# Patient Record
Sex: Male | Born: 1960 | Race: White | Hispanic: No | Marital: Single | Smoking: Current every day smoker
Health system: Southern US, Community
[De-identification: ages and names within clinical notes are randomized; demographics above are authoritative.]

## PROBLEM LIST (undated history)

## (undated) DIAGNOSIS — E559 Vitamin D deficiency, unspecified: Secondary | ICD-10-CM

## (undated) DIAGNOSIS — I639 Cerebral infarction, unspecified: Secondary | ICD-10-CM

## (undated) DIAGNOSIS — G4730 Sleep apnea, unspecified: Secondary | ICD-10-CM

## (undated) DIAGNOSIS — J449 Chronic obstructive pulmonary disease, unspecified: Secondary | ICD-10-CM

## (undated) DIAGNOSIS — I10 Essential (primary) hypertension: Secondary | ICD-10-CM

## (undated) DIAGNOSIS — E119 Type 2 diabetes mellitus without complications: Secondary | ICD-10-CM

## (undated) DIAGNOSIS — I517 Cardiomegaly: Secondary | ICD-10-CM

## (undated) DIAGNOSIS — E785 Hyperlipidemia, unspecified: Secondary | ICD-10-CM

## (undated) HISTORY — DX: Cardiomegaly: I51.7

## (undated) HISTORY — DX: Vitamin D deficiency, unspecified: E55.9

## (undated) HISTORY — PX: NASAL SINUS SURGERY: SHX719

## (undated) HISTORY — PX: KNEE SURGERY: SHX244

## (undated) HISTORY — DX: Hyperlipidemia, unspecified: E78.5

## (undated) HISTORY — DX: Sleep apnea, unspecified: G47.30

## (undated) HISTORY — DX: Type 2 diabetes mellitus without complications: E11.9

---

## 2008-12-29 ENCOUNTER — Emergency Department: Payer: Self-pay

## 2009-04-11 ENCOUNTER — Emergency Department: Payer: Self-pay | Admitting: Emergency Medicine

## 2009-05-21 ENCOUNTER — Emergency Department: Payer: Self-pay | Admitting: Emergency Medicine

## 2009-06-30 ENCOUNTER — Emergency Department: Payer: Self-pay | Admitting: Emergency Medicine

## 2012-05-18 ENCOUNTER — Observation Stay: Payer: Self-pay | Admitting: Internal Medicine

## 2012-05-18 LAB — COMPREHENSIVE METABOLIC PANEL
BUN: 25 mg/dL — ABNORMAL HIGH (ref 7–18)
Bilirubin,Total: 0.3 mg/dL (ref 0.2–1.0)
Chloride: 106 mmol/L (ref 98–107)
Creatinine: 1.19 mg/dL (ref 0.60–1.30)
Osmolality: 283 (ref 275–301)
Potassium: 4.1 mmol/L (ref 3.5–5.1)
SGPT (ALT): 31 U/L (ref 12–78)
Total Protein: 8.4 g/dL — ABNORMAL HIGH (ref 6.4–8.2)

## 2012-05-18 LAB — CBC
HCT: 47.3 %
HGB: 15.4 g/dL
MCH: 27.5 pg
MCHC: 32.5 g/dL
MCV: 85 fL
Platelet: 341 x10 3/mm 3
RBC: 5.58 x10 6/mm 3
RDW: 15.8 % — ABNORMAL HIGH
WBC: 17.3 x10 3/mm 3 — ABNORMAL HIGH

## 2012-05-18 LAB — CK TOTAL AND CKMB (NOT AT ARMC)
CK, Total: 66 U/L
CK-MB: 0.6 ng/mL

## 2012-05-18 LAB — TROPONIN I: Troponin-I: <0.02 ng/mL

## 2012-05-19 LAB — TROPONIN I
Troponin-I: <0.02 ng/mL
Troponin-I: <0.02 ng/mL

## 2012-05-19 LAB — CK-MB: CK-MB: <0.5 ng/mL — ABNORMAL LOW (ref 0.5–3.6)

## 2012-06-28 ENCOUNTER — Inpatient Hospital Stay: Payer: Self-pay | Admitting: Internal Medicine

## 2012-06-28 LAB — DIFFERENTIAL
Basophil #: 0.2 10*3/uL — ABNORMAL HIGH (ref 0.0–0.1)
Basophil %: 0.8 %
Eosinophil #: 0.5 10*3/uL (ref 0.0–0.7)
Lymphocyte #: 4.7 10*3/uL — ABNORMAL HIGH (ref 1.0–3.6)
Lymphocyte %: 17.6 %
Lymphocytes: 14 %
Monocyte #: 2.5 x10 3/mm — ABNORMAL HIGH (ref 0.2–1.0)
Monocytes: 7 %
Neutrophil %: 70.2 %
Segmented Neutrophils: 78 %

## 2012-06-28 LAB — CBC
MCH: 27.8 pg (ref 26.0–34.0)
MCHC: 32.6 g/dL (ref 32.0–36.0)
MCV: 85 fL (ref 80–100)
Platelet: 286 10*3/uL (ref 150–440)
RBC: 5.35 10*6/uL (ref 4.40–5.90)
RDW: 15.1 % — ABNORMAL HIGH (ref 11.5–14.5)

## 2012-06-28 LAB — TROPONIN I: Troponin-I: <0.02 ng/mL

## 2012-06-28 LAB — DRUG SCREEN, URINE
Amphetamines, Ur Screen: NEGATIVE (ref ?–1000)
Benzodiazepine, Ur Scrn: NEGATIVE (ref ?–200)
Cannabinoid 50 Ng, Ur ~~LOC~~: NEGATIVE (ref ?–50)
Cocaine Metabolite,Ur ~~LOC~~: NEGATIVE (ref ?–300)
MDMA (Ecstasy)Ur Screen: NEGATIVE (ref ?–500)
Tricyclic, Ur Screen: NEGATIVE (ref ?–1000)

## 2012-06-28 LAB — BASIC METABOLIC PANEL
Anion Gap: 11 (ref 7–16)
Calcium, Total: 9.4 mg/dL (ref 8.5–10.1)
Chloride: 106 mmol/L (ref 98–107)
Co2: 24 mmol/L (ref 21–32)
EGFR (African American): 41 — ABNORMAL LOW
Osmolality: 284 (ref 275–301)
Potassium: 3.5 mmol/L (ref 3.5–5.1)

## 2012-06-28 LAB — HEPATIC FUNCTION PANEL A (ARMC)
Bilirubin,Total: 0.3 mg/dL (ref 0.2–1.0)
Total Protein: 7.4 g/dL (ref 6.4–8.2)

## 2012-06-28 LAB — ETHANOL: Ethanol: <3 mg/dL

## 2012-06-29 LAB — CBC WITH DIFFERENTIAL/PLATELET
Bands: 3 %
Comment - H1-Com2: NORMAL
Eosinophil #: 0.5 10*3/uL (ref 0.0–0.7)
Eosinophil %: 3.1 %
Lymphocyte #: 3.6 10*3/uL (ref 1.0–3.6)
Lymphocyte %: 24.4 %
Lymphocytes: 24 %
MCH: 28.1 pg (ref 26.0–34.0)
MCHC: 33.2 g/dL (ref 32.0–36.0)
Monocyte #: 1.3 x10 3/mm — ABNORMAL HIGH (ref 0.2–1.0)
Monocytes: 2 %
Neutrophil %: 62.8 %
Platelet: 258 10*3/uL (ref 150–440)
RBC: 4.86 10*6/uL (ref 4.40–5.90)
RDW: 14.8 % — ABNORMAL HIGH (ref 11.5–14.5)
Segmented Neutrophils: 59 %

## 2012-06-29 LAB — BASIC METABOLIC PANEL
Anion Gap: 10 (ref 7–16)
BUN: 20 mg/dL — ABNORMAL HIGH (ref 7–18)
Calcium, Total: 8.9 mg/dL (ref 8.5–10.1)
Co2: 24 mmol/L (ref 21–32)
Creatinine: 1.34 mg/dL — ABNORMAL HIGH (ref 0.60–1.30)
Glucose: 124 mg/dL — ABNORMAL HIGH (ref 65–99)
Osmolality: 285 (ref 275–301)
Potassium: 3.7 mmol/L (ref 3.5–5.1)
Sodium: 141 mmol/L (ref 136–145)

## 2012-06-29 LAB — LIPID PANEL
Ldl Cholesterol, Calc: 145 mg/dL — ABNORMAL HIGH (ref 0–100)
Triglycerides: 182 mg/dL (ref 0–200)

## 2012-06-30 LAB — BASIC METABOLIC PANEL
Anion Gap: 8 (ref 7–16)
BUN: 11 mg/dL (ref 7–18)
Creatinine: 0.95 mg/dL (ref 0.60–1.30)
EGFR (African American): >60
EGFR (Non-African Amer.): >60
Sodium: 140 mmol/L (ref 136–145)

## 2012-07-02 LAB — BASIC METABOLIC PANEL
Anion Gap: 9 (ref 7–16)
Calcium, Total: 9.2 mg/dL (ref 8.5–10.1)
Chloride: 103 mmol/L (ref 98–107)
Co2: 27 mmol/L (ref 21–32)
Glucose: 122 mg/dL — ABNORMAL HIGH (ref 65–99)
Osmolality: 278 (ref 275–301)
Potassium: 4.2 mmol/L (ref 3.5–5.1)

## 2012-07-02 LAB — CBC WITH DIFFERENTIAL/PLATELET
Basophil #: 0.2 10*3/uL — ABNORMAL HIGH (ref 0.0–0.1)
Basophil %: 1.1 %
Eosinophil %: 4.5 %
HCT: 45.3 % (ref 40.0–52.0)
Lymphocyte #: 4.5 10*3/uL — ABNORMAL HIGH (ref 1.0–3.6)
Lymphocyte %: 28 %
Monocyte %: 13.1 %
Neutrophil #: 8.5 10*3/uL — ABNORMAL HIGH (ref 1.4–6.5)
Platelet: 282 10*3/uL (ref 150–440)
RBC: 5.31 10*6/uL (ref 4.40–5.90)
RDW: 14.5 % (ref 11.5–14.5)
WBC: 16 10*3/uL — ABNORMAL HIGH (ref 3.8–10.6)

## 2012-07-09 ENCOUNTER — Ambulatory Visit: Payer: Self-pay | Admitting: Unknown Physician Specialty

## 2012-12-13 DIAGNOSIS — D386 Neoplasm of uncertain behavior of respiratory organ, unspecified: Secondary | ICD-10-CM | POA: Insufficient documentation

## 2012-12-13 DIAGNOSIS — J329 Chronic sinusitis, unspecified: Secondary | ICD-10-CM | POA: Insufficient documentation

## 2013-01-11 DIAGNOSIS — K219 Gastro-esophageal reflux disease without esophagitis: Secondary | ICD-10-CM | POA: Insufficient documentation

## 2013-01-11 DIAGNOSIS — R569 Unspecified convulsions: Secondary | ICD-10-CM | POA: Insufficient documentation

## 2013-01-11 DIAGNOSIS — R0609 Other forms of dyspnea: Secondary | ICD-10-CM

## 2013-01-11 DIAGNOSIS — I10 Essential (primary) hypertension: Secondary | ICD-10-CM | POA: Insufficient documentation

## 2013-01-11 DIAGNOSIS — Z720 Tobacco use: Secondary | ICD-10-CM | POA: Insufficient documentation

## 2013-01-11 DIAGNOSIS — J449 Chronic obstructive pulmonary disease, unspecified: Secondary | ICD-10-CM | POA: Insufficient documentation

## 2013-10-01 ENCOUNTER — Observation Stay: Payer: Self-pay | Admitting: Internal Medicine

## 2013-10-01 LAB — COMPREHENSIVE METABOLIC PANEL
ALK PHOS: 123 U/L — AB
Albumin: 4.1 g/dL (ref 3.4–5.0)
Anion Gap: 12 (ref 7–16)
BILIRUBIN TOTAL: 0.3 mg/dL (ref 0.2–1.0)
BUN: 16 mg/dL (ref 7–18)
CO2: 19 mmol/L — AB (ref 21–32)
CREATININE: 1.47 mg/dL — AB (ref 0.60–1.30)
Calcium, Total: 9.2 mg/dL (ref 8.5–10.1)
Chloride: 106 mmol/L (ref 98–107)
EGFR (African American): >60
EGFR (Non-African Amer.): 53 — ABNORMAL LOW
Glucose: 77 mg/dL (ref 65–99)
Osmolality: 274 (ref 275–301)
Potassium: 4.4 mmol/L (ref 3.5–5.1)
SGOT(AST): 28 U/L (ref 15–37)
SGPT (ALT): 45 U/L (ref 12–78)
Sodium: 137 mmol/L (ref 136–145)
Total Protein: 7.5 g/dL (ref 6.4–8.2)

## 2013-10-01 LAB — CBC
HCT: 47.7 % (ref 40.0–52.0)
HGB: 15.4 g/dL (ref 13.0–18.0)
MCH: 27 pg (ref 26.0–34.0)
MCHC: 32.3 g/dL (ref 32.0–36.0)
MCV: 84 fL (ref 80–100)
Platelet: 314 10*3/uL (ref 150–440)
RBC: 5.71 10*6/uL (ref 4.40–5.90)
RDW: 14.8 % — AB (ref 11.5–14.5)
WBC: 25.7 10*3/uL — AB (ref 3.8–10.6)

## 2013-10-01 LAB — URINALYSIS, COMPLETE
BACTERIA: NONE SEEN
BILIRUBIN, UR: NEGATIVE
Glucose,UR: NEGATIVE mg/dL (ref 0–75)
Granular Cast: 10
KETONE: NEGATIVE
LEUKOCYTE ESTERASE: NEGATIVE
Nitrite: NEGATIVE
PROTEIN: NEGATIVE
Ph: 5 (ref 4.5–8.0)
RBC,UR: 2 /HPF (ref 0–5)
Specific Gravity: 1.016 (ref 1.003–1.030)
Squamous Epithelial: NONE SEEN
WBC UR: 6 /HPF (ref 0–5)

## 2013-10-01 LAB — TROPONIN I: Troponin-I: <0.02 ng/mL

## 2013-10-01 LAB — PROTIME-INR
INR: 1
PROTHROMBIN TIME: 13.1 s (ref 11.5–14.7)

## 2013-10-02 LAB — LIPID PANEL
CHOLESTEROL: 143 mg/dL (ref 0–200)
HDL: 21 mg/dL — AB (ref 40–60)
Ldl Cholesterol, Calc: 91 mg/dL (ref 0–100)
TRIGLYCERIDES: 155 mg/dL (ref 0–200)
VLDL Cholesterol, Calc: 31 mg/dL (ref 5–40)

## 2013-10-03 LAB — SEDIMENTATION RATE: Erythrocyte Sed Rate: 2 mm/hr (ref 0–20)

## 2013-11-25 ENCOUNTER — Encounter: Payer: Self-pay | Admitting: Internal Medicine

## 2013-12-25 ENCOUNTER — Encounter: Payer: Self-pay | Admitting: Internal Medicine

## 2014-01-24 ENCOUNTER — Encounter: Payer: Self-pay | Admitting: Internal Medicine

## 2014-05-13 ENCOUNTER — Ambulatory Visit: Payer: Self-pay | Admitting: Specialist
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
## 2015-01-13 NOTE — Discharge Summary (Signed)
PATIENT NAME:  Mark Vang, Mark Vang MR#:  [id number] DATE OF BIRTH:  09-14-1961  DATE OF ADMISSION:  06/28/2012 DATE OF DISCHARGE:  07/02/2012  DISCHARGE DIAGNOSIS: Stroke.   PROCEDURES: CT scan of brain. MRI of the brain.   DISCHARGE MEDICATIONS:  1. Aspirin 81 mg daily.  2. Lipitor 20 mg daily.  3. Patient to resume other home medications.   CONSULTATION: Neurology.   HOSPITAL COURSE: Patient was admitted through the Emergency Room where he presented with several hour history of speech difficulties and numbness and paresthesia on his left side. His symptoms started improving prior to arrival in the ED and was admitted to monitored unit for further evaluation and management. Initial CT scan was negative for an acute infarct, however, MRI of the brain confirmed an acute stroke. Patient was started on antiplatelet therapy and his clinical symptoms continued to improve. At the time of discharge patient'Sylva Overley speech had improved considerably but still expressed expressive dysphasia. Patient also did complain of abdominal pain during his stay here. This was relieved by laxatives. His imaging was normal specifically abdominal imaging. Patient'Lilliauna Van risk factors were addressed. He had an elevated LDL and was started on Lipitor to address this. Patient will be discharged to home in a stable condition.   DISCHARGE INSTRUCTIONS:  1. Diet: Low fat, low sodium.  2. Activity as tolerated.  3. Follow up with Dr. Elijio Miles in 1 to 2 weeks.   DISCHARGE PROCESS TIME SPENT: 35 minutes.  ____________________________ Venetia Maxon Elijio Miles, MD sat:cms D: 07/08/2012 11:56:16 ET T: 07/09/2012 09:31:20 ET  JOB#: [id number] Alfredia Ferguson A TEJAN-SIE MD ELECTRONICALLY SIGNED 07/13/2012 13:17
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
## 2015-01-13 NOTE — Consult Note (Signed)
Referring Physician:  Darrick Meigs :   Primary Care Physician:  Selinda Michaels, [address] [place], [place] [address], [place], [facility] : [facility], [address], [place], [place] [address], [place] [phone]  Reason for Consult:  Admit Date: 28-Jun-2012   Chief Complaint: Vertigo and left body numbness.   Reason for Consult: CVA; vertigo   History of Present Illness:  History of Present Illness:   PATIENT NAME:  Mark Vang, Mark Vang [id number] OF BIRTH:  02-May-1961 COMPLAINT: left body numbness, vertigo, and speech deficit.  OF PRESENT ILLNESS: man with a history of COPD and recent admission for chest pain found to be of non cardiac etiology is admitted because of left body numbness, vertigo, and speech deficit.  He says he was fishing two days ago when he began to feel "like I needed to go.  Now".  He says he felt an abrupt onset sense of dizziness/vertigo and some lightheadedness which was very unusual for him.  He also noticed some abnormal vision changes although he cannot clearly describe this in any detail besides saying "I was seeing six out of each eye".  He was able to walk without problems.  He then began to have some left sided numbness in the LUE and also possibly the left torso but not the left leg.  He became nauseous and was sweating profusely but did not vomit.  There was no associated chest pain.  He then went home.  While at home he noticed that he was having difficulty getting his words out.  He says he knew what he wanted to say but could not get the words out.  He also endorses some general confusion since the event started. symptoms were constant when they were present and since the event they he is feeling a lot better.  Nothing seemed to make the symptoms better or worse.  The symptoms he believes the symptoms were of moderate severity.  Currently, the symptoms are  essentially completely resolved except that he has some word finding difficulties and mild difficulty getting his words out.       had never had symptoms like this before.   MEDICAL HISTORY:  1. Osteoarthritis of the knees and hips. 2. Gastroesophageal reflux disease.  Chronic obstructive pulmonary disease.  Tobacco abuse.  Bilateral knee surgeries.  Broken ribs in the chest.  History of chest pain, resolved, not cardiac.  SURGICAL HISTORY: B/l knee surgeries.  HISTORY:  HTN and CAD. HISTORY: Patient is currently on disability. Denies alcohol use. Denies IV drug abuse. Smokes 1-2 ppd cigarettes. MEDICATIONS:  1. Tramadol 50 mg twice daily.  2. Protonix 40 mg daily. ProAir HFA 2 puffs 4 times a day. Naproxen 500 mg twice daily.  Loratadine 10 mg once daily.  Lisinopril 20 mg once daily.  Albuterol ipratropium Advair Diskus 250 mg twice daily.   ALLERGIES: No known drug allergies.   GENERAL.gentleman, with his mom and girlfriend today.  Normocephalic and atraumatic. exam without pharmacologic dilation shows normal disc size, appearance and C/D ratio.  There is no papilledema. and S2 sounds are within normal limits, without murmurs, gallops, or rubs. - Normal- NormalDrift - Absent bilaterally.- Gait and station are within normal limits, walks to the bathroom with no problems.    Shoulder abduction (deltoid/supraspinatus, axillary/suprascapular n, C5)   Elbow flexion (biceps brachii, musculoskeletal n, C5-6)   Elbow extension (triceps, radial n, C7)   Finger adduction (interossei,  ulnar n, T1)    Hip flexion (iliopsoas, L1/L2)   Knee flexion (hamstrings, sciatic n, L5/S1)    Knee extension (quadriceps, femoral n, L3/4)   Ankle dorsiflexion (tibialis anterior, deep fibular n, L4/5)   Ankle plantarflexion (gastroc, tibial n, S1)  STATUS.is oriented to person, place and time.  Recent and remote memory are intact.  Attention span and concentration are intact.  Naming, repetition, and comprehension are  within normal limits.  There is mild word finding difficulty during spontaneous speech.  There is mild expressive aphasia.  Patient's fund of knowledge is within normal limits for educational level. NERVES.   CN II (normal visual acuity and visual fields)   CN III, IV, VI (extraocular muscles are intact)   CN V (facial sensation is intact bilaterally)   CN VII (facial strength is intact bilaterally)   CN VIII (hearing is intact bilaterally)   CN IX/X (palate elevates midline, normal phonation)   CN XI (shoulder shrug strength is normal and symmetric)   CN XII (tongue protrudes midline) to pain and temp bilaterally (spinothalamic tracts)to position and vibration bilaterally (dorsal columns)    Biceps   Brachioradialis    Patellar   Achilles to nose testing is within normal limits bilaterally. have personally viewed the Brain MRI results and agree with the radiologist interpretation that there appears to be restricted diffusion in the left frontal head region suggesting acute stroke in this location. man with a history of COPD and recent admission for chest pain found to be of non cardiac etiology is admitted because of left body numbness, vertigo, and speech deficit.   STROKE - LEFT FRONTAL - The location of this stroke explains his mild expressive aphasia.  However, there is no lesion to explain his left sided numbness and vertigo.  It is possible that he had a proximal thromboembolism resulting in a stroke in the left frontal location and smaller emboli that lodged in the right MCA causing the transient left sided symptoms that have resolved.   - Continue frequent neuro checks (Q4h) for first 24 hours and reassess frequency after that time. - Brain MRI/A - Personaly viewed, shows left frontal stroke in the location of Broca's areaCarotid doppler to rule out carotid stenosis - TTE with bubble study to evaluate for intracardiac shunts or cardiac thrombi - Monitor on telemetry and repeat EKG to r/o afib -  Check fasting lipid panel, TSH and HbA1c - Cycle cardiac enzymes . CARDIOVASCULAR: - Cardiac monitoring as described above. - Antiplatelet therapy: continue aspirin 81 mg PO daily for secondary stroke prevention . LIPID TREATMENT: - Per ATP III guidlines, treat if LDL > 70 . BLOOD PRESSURE CONTROL: - During the first several hours after stroke, control for factors such as anxiety/stress/pain. When these factors are controlled, if blood pressure remains higher than 220/110, treat with a short acting agent (IV labetalol). - Allow permissive HTN for BP less than 220/110 - The 2 trials of antihypertensive therapy to prevent recurrent stroke that have shown benefit started medication >2 weeks and >4 weeks after the acute stroke. Marland Kitchen ROUTINE: - PT, OT are not necessary since his exam is back to normal. - Would recommend Speech therapy evaluation for mild expressive aphasia, word finding difficulties, and stroke education - Counseling for smoke cessation. - DVT prophylaxis with [place] lovenox. Melrose Nakayama, MD       ROS:   General denies complaints    HEENT no complaints    Lungs no complaints    Cardiac no  complaints    GI no complaints    GU no complaints    Musculoskeletal no complaints    Extremities no complaints    Skin no complaints    Endocrine no complaints   Past Medical/Surgical Hx:  HTN:   arthritis in both knees and hips:   broken rib on left chest:   gerd:   copd:   bilateral knee sugeries:   Home Medications: Medication Instructions Last Modified Date/Time  Protonix 40 mg oral delayed release tablet 1 tab(s) orally once a day 03-Oct-13 17:44  lisinopril 20 mg oral tablet 1 tab(s) orally once a day 03-Oct-13 17:44  Advair Diskus 250 mcg-50 mcg inhalation powder 1 puff(s) inhaled 2 times a day 03-Oct-13 17:44  ProAir HFA 2 puff(s) orally 4 times a day, As Needed- for Shortness of Breath  03-Oct-13 17:44  naproxen 500 mg oral tablet 1 tab(s) orally 2 times a  day 03-Oct-13 17:44  loratadine 10 mg oral tablet 1 tab(s) orally once a day 03-Oct-13 17:44  albuterol-ipratropium 1 vial(s) inhaled , As Needed- for Shortness of Breath  03-Oct-13 17:44  tramadol 50 mg oral tablet 1 tab(s) orally 2 times a day 03-Oct-13 17:44   [facility] Current Meds:  Acetaminophen * tablet, ( Tylenol (325 mg) tablet)  650 mg Oral q4h PRN for pain or temp. greater than 100.4  - Indication: Pain/Fever  Aspirin Enteric Coated tablet, ( Ecotrin)  81 mg Oral daily  - Indication: Pain/Fever/Thromboembolic Disorders/Post MI/Prophylaxis MI  Instructions:  Initiate Bleeding Precautions Protocol--DO NOT CRUSH  HePARin injection, 5000 unit(s), Subcutaneous, q12h  Indication: Anticoagulant, Monitor Anticoags per hospital protocol  Ondansetron injection, ( Zofran injection )  4 mg, IV push, q4h PRN for Nausea/Vomiting  Indication: Nausea/ Vomiting  Pantoprazole tablet, 40 mg Oral q6am  - Indication: Erosive Esophagitis/ GERD  Instructions:  DO NOT CRUSH  atorvaSTATin tablet,  ( Lipitor)  10 mg Oral daily  - Indication: Hypercholesterolemia  Fluticasone/Salmeterol 250/50 inhaler, ( Advair Diskus 250/50 inhaler )  1 puff(s) Inhalation bid  Instructions:  RINSE MOUTH AFTER USE  Loratadine tablet, ( Claritin)  10 mg Oral daily  - Indication: Allergy/ Cold  Influenza Virus Trivalent Vaccine injection, 0.5 ml, Intramuscular, atdischarge  Indication: Provide Active Immunity to Influenza Strains contained in Vaccine, GIVE ON DISCHARGE DAY.  Lisinopril tablet, ( Zestril)  20 mg Oral daily  - Indication: Hypertension/ CHF  Nicotine Patch, ( Habitrol patch )  21 mg Transdermal daily  -Indication:Smoking Cessation  Instructions:  [Waste Code: Black with pkg]  traMADol tablet, ( Ultram)  50 mg Oral q6h PRN for pain  - Indication: Pain  Acetaminophen-HYDROcodone 325/5 mg tablet, ( Norco  5/325 mg)  1 tablet(s) Oral q6h PRN for pain  - Indication: Pain  Instructions:  [Med Admin  Window: 30 mins before or after scheduled dose]  Allergies:  No Known Allergies:   Vital Signs: **Vital Signs.:   05-Oct-13 08:35   Vital Signs Type Routine   Temperature Temperature (F) 99.4   Celsius 37.4   Temperature Source Oral   Pulse Pulse 88   Respirations Respirations 20   Systolic BP Systolic BP 591   Diastolic BP (mmHg) Diastolic BP (mmHg) 81   Mean BP 97   Pulse Ox % Pulse Ox % 93   Pulse Ox Activity Level  At rest   Oxygen Delivery Room Air/ 21 %   Lab Results:  Hepatic:  03-Oct-13 17:56    Bilirubin, Total 0.3   Bilirubin, Direct <  0.05 (Result(s) reported on 28 Jun 2012 at 07:40PM.)   Alkaline Phosphatase 110   SGPT (ALT) 30   SGOT (AST) 23   Total Protein, Serum 7.4   Albumin, Serum 4.3  Routine Chem:  03-Oct-13 17:56    Ethanol, S. < 3   Ethanol % (comp) < 0.003 (Result(s) reported on 28 Jun 2012 at 07:40PM.)   Result Comment diff - SMEAR SCANNED  Result(s) reported on 28 Jun 2012 at 08:09PM.  04-Oct-13 04:48    Cholesterol, Serum  205   Triglycerides, Serum 182   HDL (INHOUSE)  24   VLDL Cholesterol Calculated 36   LDL Cholesterol Calculated  145 (Result(s) reported on 29 Jun 2012 at 05:15AM.)   Hemoglobin A1c [facility]) 6.0 (The American Diabetes Association recommends that a primary goal of therapy should be <7% and that physicians should reevaluate the treatment regimen in patients with HbA1c values consistently >8%.)  05-Oct-13 05:27    Glucose, Serum  105   BUN 11   Creatinine (comp) 0.95   Sodium, Serum 140   Potassium, Serum 4.0   Chloride, Serum 106   CO2, Serum 26   Calcium (Total), Serum 8.7   Anion Gap 8   Osmolality (calc) 279   eGFR (African American) >60   eGFR (Non-African American) >60 (eGFR values <27m/min/1.73 m2 may be an indication of chronic kidney disease (CKD). Calculated eGFR is useful in patients with stable renal function. The eGFR calculation will not be reliable in acutely ill patients when serum creatinine is  changing rapidly. It is not useful in  patients on dialysis. The eGFR calculation may not be applicable to patients at the low and high extremes of body sizes, pregnant women, and vegetarians.)  Urine Drugs:  [id number]   Tricyclic Antidepressant, Ur Qual (comp) NEGATIVE (Result(s) reported on 28 Jun 2012 at 09:02PM.)   Amphetamines, Urine Qual. NEGATIVE   MDMA, Urine Qual. NEGATIVE   Cocaine Metabolite, Urine Qual. NEGATIVE   Opiate, Urine qual NEGATIVE   Phencyclidine, Urine Qual. NEGATIVE   Cannabinoid, Urine Qual. NEGATIVE   Barbiturates, Urine Qual. NEGATIVE   Benzodiazepine, Urine Qual. NEGATIVE (----------------- The URINE DRUG SCREEN provides only a preliminary, unconfirmed analytical test result and should not be used for non-medical  purposes.  Clinical consideration and professional judgment should be  applied to any positive drug screen result due to possible interfering substances.  A more specific alternate chemical method must be used in order to obtain a confirmed analytical result.  Gas chromatography/mass spectrometry (GC/MS) is the preferred confirmatory method.)   Methadone, Urine Qual. NEGATIVE  Cardiac:  03-Oct-13 17:56    Troponin I < 0.02 (0.00-0.05 0.05 ng/mL or less: NEGATIVE  Repeat testing in 3-6 hrs  if clinically indicated. >0.05 ng/mL: POTENTIAL  MYOCARDIAL INJURY. Repeat  testing in 3-6 hrs if  clinically indicated. NOTE: An increase or decrease  of 30% or more on serial  testing suggests a  clinically important change)   CK, Total 43   CPK-MB, Serum  < 0.5 (Result(s) reported on 28 Jun 2012 at 06:25PM.)  Routine Hem:  03-Oct-13 17:56    Reference Accession# [id number] 04-Oct-13 04:48    Manual Diff MANUAL DIFF DONE  Result(s) reported on 29 Jun 2012 at 05:15AM.   WBC (CBC)  14.6   RBC (CBC) 4.86   Hemoglobin (CBC) 13.6   Hematocrit (CBC) 41.1   Platelet Count (CBC) 258   MCV 85   MCH 28.1   MCHC 33.2  RDW  14.8   Neutrophil  % 62.8   Lymphocyte % 24.4   Monocyte % 8.7   Eosinophil % 3.1   Basophil % 1.0   Neutrophil #  9.2   Lymphocyte # 3.6   Monocyte #  1.3   Eosinophil # 0.5   Basophil # 0.1 (Result(s) reported on 29 Jun 2012 at 07:05AM.)   Bands 3   Segmented Neutrophils 59   Lymphocytes 24   Variant Lymphocytes 9   Monocytes 2   Eosinophil 3   Diff Comment 1 RBCs APPEAR NORMAL   Diff Comment 2 NORMAL PLT MORPHOLGY  Result(s) reported on 29 Jun 2012 at 07:05AM.   Radiology Results: CT:    03-Oct-13 19:19, CT Head Without Contrast   CT Head Without Contrast    REASON FOR EXAM:    confusion and vertigo  COMMENTS:       PROCEDURE: CT  - CT HEAD WITHOUT CONTRAST  - Jun 28 2012  7:19PM     RESULT: Comparison:  None    Technique: Multiple axial images from the foramen magnum to the vertex   were obtained without IV contrast.    Findings:      There is no evidence of mass effect, midline shift, or extra-axial fluid   collections.  There is no evidence of a space-occupying lesion or   intracranial hemorrhage. There is no evidence of a cortical-based area of     acute infarction. There is generalized cerebral atrophy.     The ventricles and sulci are appropriate for the patient's age. The basal   cisterns are patent.    Visualized portions of the orbits are unremarkable. There is   near-complete opacification of the right maxillary sinus. There is right   ethmoid sinus mucosal thickening. There is near-complete opacification of   the right frontal sinus. Cerebrovascular atherosclerotic calcifications   are noted.    The osseous structures are unremarkable.    IMPRESSION:      No acute intracranial process.    Right the frontal, ethmoid and maxillary sinus disease.    Dictation Site: 1          Verified By: Jennette Banker, M.D., MD   Radiology Impression:  Radiology Impression: MR BRAIN W/WO AND MRA BRAIN  WO  - Jun 29 2012  2:29PM     IMPRESSION:   1. 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  2. Paranasal sinus disease.    MRA NECK CAROTIDS WO/W  - Jun 29 2012  2:29PM     IMPRESSION:   No hemodynamically significant stenosis in the carotid arteries.   Electronic Signatures: Anabel Bene (MD)  (Signed 05-Oct-13 11:32)  Authored: REFERRING PHYSICIAN, Primary Care Physician, Consult, History of Present Illness, Review of Systems, PAST MEDICAL/SURGICAL HISTORY, HOME MEDICATIONS, Current Medications, ALLERGIES, NURSING VITAL SIGNS, LAB RESULTS, RADIOLOGY RESULTS   Last Updated: 05-Oct-13 11:32 by Anabel Bene (MD)
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
## 2015-01-17 NOTE — H&P (Signed)
PATIENT NAME:  Mark Vang, Mark Vang MR#:  [id number] DATE OF BIRTH:  Oct 26, 1960  DATE OF ADMISSION:  10/01/2013  REFERRING PHYSICIAN:  Dr. Jacqualine Code.  PRIMARY CARE PHYSICIAN:  Dr. Elijio Miles.  CHIEF COMPLAINT: Right arm weakness.   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 He describes acute onset of initially dizziness, which occurred when he was working outside. He describes his dizziness as lightheadedness. He had an associated right arm weakness and numbness. Symptoms lasted for many hours, now slowly resolving at 9:00 p.m.  He was concerned this was similar to his presentation back in 2013, which turned out to be a stroke. On workup in the Emergency Department, original CT head has been normal so far.   He also is complaining of headache, which is global, throbbing, mostly frontal without radiation, intensity 3 to 4 out of 10 without radiation. No worsening or relieving factors.   REVIEW OF SYSTEMS:  CONSTITUTIONAL: Denies fever, fatigue.  EYES: Denies blurred vision, double vision, eye pain.  EARS, NOSE, THROAT: Denies tinnitus, ear pain, hearing loss.  RESPIRATORY: Denies cough, wheeze, shortness of breath.  CARDIOVASCULAR: Denies chest pain, palpitations, edema.  GASTROINTESTINAL:  Denies nausea, vomiting, diarrhea, abdominal pain.  GENITOURINARY: Denies dysuria or hematuria.  ENDOCRINE: Denies nocturia or thyroid problems.  HEMATOLOGIC AND LYMPHATICS:  Denies easy bruising or bleeding.  SKIN: Denies rash or lesions.  MUSCULOSKELETAL: Denies pain in neck, back, shoulder, knees, hips, arthritic symptoms.  NEUROLOGIC: Positive for weakness and numbness as described above, as well as headache.  PSYCHIATRIC: Denies any active anxiety or depressive symptoms. Otherwise, full review of systems  performed by me is negative.   PAST MEDICAL  HISTORY: Hypertension, gastroesophageal reflux disease, COPD, history of CVA with residual expressive aphasia.   SOCIAL HISTORY: Every day tobacco abuse, less than 1 pack daily. Occasional alcohol usage. Last drink about 1 month ago.   FAMILY HISTORY: Positive for hypertension and coronary artery disease.   ALLERGIES: No known drug allergies.   HOME MEDICATIONS: Include Advair 250/50 mcg 1 puff b.i.d., DuoNeb treatments 3 mL inhalation 4 times daily as needed for shortness of breath, aspirin 81 mg p.o. daily, atorvastatin 40 mg p.o. at bedtime,  cetirizine 10 mg p.o. daily, Flexeril 10 mg p.o. 3 times daily, lisinopril  20 mg p.o. daily, naproxen 500 mg p.o. b.i.d., pantoprazole 40 mg p.o. daily, ProAir 90 mcg inhalation 2 puffs 4 times daily as needed for shortness of breath, Spiriva 18 mcg inhalation once daily.   PHYSICAL EXAMINATION: VITAL SIGNS: Temperature 98, heart rate 80, respirations 18, blood pressure 180/80, saturating 97% on room air. Weight 114.3 kg, BMI 34.2.  GENERAL: Well-nourished, well-developed, Caucasian gentleman, currently in no acute distress.  HEAD: Normocephalic, atraumatic.  EYES: Pupils equal, round and reactive to light.  Extraocular muscles intact. No scleral icterus.  MOUTH: Moist mucosa. Dentition intact. No abscess noted.  EAR, NOSE, THROAT:  Throat: Clear without exudates. No external lesions.  NECK: Supple. No thyromegaly. No nodules. No JVD.  PULMONARY: Clear to auscultation bilaterally. No wheezes, rubs, rhonchi. No use of accessory muscles. Good respiratory effort.  CHEST: Nontender to palpation.  CARDIOVASCULAR: S1, S2, regular rate and rhythm. No murmurs, rubs or gallops. No edema. Pedal pulses 2+ bilaterally.   GASTROINTESTINAL:  Soft, nontender, nondistended. No masses. Positive bowel sounds. No hepatosplenomegaly.  MUSCULOSKELETAL: No swelling, clubbing or edema. Range of motion full  in all extremities.  NEUROLOGIC: Cranial nerves II through XII  intact aside from he does have decreased sensation over the maxillary branch of trigeminal nerve on the right side. Positive pronator drift on the right side. Strength is decreased on the right side, most prominent in hand grip, which is 4-/5. He also has proximal and distal flexor and extensor muscle weakness, 4-/5 on the right upper extremity when compared to the left. Gait deferred at this time. Reflexes intact.  SKIN: No ulcerations, lesions, rash or cyanosis. Skin warm, dry. Turgor is intact.  PSYCHIATRIC:  Mood and affect within normal limits. The patient is awake, alert and oriented x 3. Insight and judgment intact.   LABORATORY DATA: Sodium 137, potassium 4.4, chloride 106, bicarb 19, BUN 16, creatinine 1.47, glucose 77. Total protein 7.5, albumin 4.1, total bilirubin 0.3, alk phos 123, AST 20, ALT 45. Troponin I less than 0.02. WBC 25.7, hemoglobin 15.4, platelets 314. EKG performed, normal sinus rhythm, heart rate 99. CT head performed revealing no acute intracranial process.   ASSESSMENT AND PLAN: A 54 year old Caucasian gentleman with history of cerebrovascular accident  with residual expressive aphasia, presenting with acute onset of right arm weakness and numbness.  1.  Transient ischemic attack. Symptoms are resolving. CT head normal. He has been given aspirin thus far and statin therapy. Allow permissive hypertension. Do not treat blood pressure unless systolic greater than 034, diastolic greater than 035. If required, we will give hydralazine 10 mg for blood pressure control with neuro checks q.4 hours. We will check an MRI, transthoracic echocardiogram, as well as carotid Dopplers.  2.  Acute kidney injury on fluid hydration.  Hold nephrotoxic agents including ACE inhibitors.  3.  Chronic obstructive pulmonary disease. Continue Advair, Spiriva. Provide DuoNeb therapy. 4.  Gastroesophageal reflux disease.  Continue with home dose of PPI.  5.  Venous thromboembolism prophylaxis with  sequential compression devices.   The patient is full code.   TIME SPENT: 45 minutes.    ____________________________ Aaron Mose. Patrica Mendell, MD dkh:dmm D: 10/01/2013 21:38:00 ET T: 10/01/2013 22:05:20 ET JOB#: [id number]  cc: Aaron Mose. Lavelle Akel, MD, <Dictator> Jazilyn Siegenthaler Woodfin Ganja MD ELECTRONICALLY SIGNED 10/10/2013 0:29

## 2015-01-17 NOTE — Consult Note (Signed)
Referring Physician:  Lytle Butte :   Primary Care Physician:  Selinda Michaels, [address] [place], [place] [address], [place]  Reason for Consult: Admit Date: 01-Oct-2013  Chief Complaint: R sided weakness  Reason for Consult: CVA   History of Present Illness: History of Present Illness:   54 yo RHD M presents to [facility] secondary to R sided weakness and dizziness.  Pt reports that he had a stroke 2 years ago with difficulty speaking and some R sided weakness but no episodes since.  Pt reports that he felt dizzy then had R sided weakness for 6 hours where he was flaccid.  He then had a headache after but no shaking like movements described.    ROS:  General denies complaints   HEENT no complaints   Lungs no complaints   Cardiac no complaints   GI no complaints   GU no complaints   Musculoskeletal no complaints   Extremities no complaints   Skin no complaints   Neuro headache  numbness/tingling   Endocrine no complaints   Psych no complaints   Past Medical/Surgical Hx:  HTN:   arthritis in both knees and hips:   broken rib on left chest:   gerd:   copd:   bilateral knee sugeries:   Past Medical/ Surgical Hx:  Past Medical History as above   Past Surgical History as above   Home Medications: Medication Instructions Last Modified Date/Time  Aspirin Enteric Coated 325 mg oral delayed release tablet 1 tab(s) orally once a day 08-Jan-15 13:23  Advair Diskus 250 mcg-50 mcg inhalation powder 1 puff(s) inhaled 2 times a day, As Needed - for Shortness of Breath 06-Jan-15 18:54  naproxen 500 mg oral tablet 1 tab(s) orally 2 times a day 06-Jan-15 18:54  albuterol-ipratropium 2.5 mg-0.5 mg/3 mL inhalation solution 3 milliliter(s) inhaled 4 times a day, As Needed - for Shortness of Breath  06-Jan-15 18:54  atorvastatin 40 mg oral tablet 1 tab(s) orally once a day (at bedtime) 06-Jan-15 18:54  lisinopril 20 mg oral tablet 1 tab(s)  orally once a day (in the morning) 06-Jan-15 18:54  ProAir HFA CFC free 90 mcg/inh inhalation aerosol 2 puff(s) inhaled 4 times a day, As Needed - for Shortness of Breath  06-Jan-15 18:54  pantoprazole 40 mg oral delayed release tablet 1 tab(s) orally once a day 06-Jan-15 18:54  cyclobenzaprine 10 mg oral tablet 1 tab(s) orally 3 times a day 06-Jan-15 18:54  cetirizine 10 mg oral tablet 1 tab(s) orally once a day 06-Jan-15 18:54  Spiriva 18 mcg inhalation capsule 1 cap(s) inhaled once a day, As Needed - for Shortness of Breath 06-Jan-15 18:54   Allergies:  blood pressure medicine...dry flaky skin irritation: Other  Allergies:  Allergies NKDA   Social/Family History: Employment Status: currently employed  Lives With: alone  Living Arrangements: apartment  Social History: + tob, rare EtOH, no illicits  Family History: no stroke or CAD   Vital Signs: **Vital Signs.:   08-Jan-15 14:01  Vital Signs Type Routine  Temperature Temperature (F) 99.1  Celsius 37.2  Temperature Source oral  Pulse Pulse 86  Respirations Respirations 18  Systolic BP Systolic BP 124  Diastolic BP (mmHg) Diastolic BP (mmHg) 81  Mean BP 98  Pulse Ox % Pulse Ox % 96  Pulse Ox Activity Level  At rest  Oxygen Delivery Room Air/ 21 %   Physical Exam: General: slightly overweight, NAD, resting comfortable  HEENT: normocephalic, sclera nonicteric, oropharynx  clear  Neck: supple, no JVD, no bruits  Chest: CTA B, no wheezing, good movement  Cardiac: RRR, no murmurs, no edema, 2+ pulses  Extremities: no C/C/E, FROM   Neurologic Exam: Mental Status: alert and oriented x 3, normal speech and language, follows complex commands  Cranial Nerves: PERRLA, EOMI, nl VF, face symmetric, tongue midline, shoulder shrug equal  Motor Exam: 5/5 B normal, tone, no tremor  Deep Tendon Reflexes: 2+/4 B, plantars downgoing B, no Hoffman  Sensory Exam: pinprick, temperature, and vibration intact B  Coordination: FTN and HTS  WNL, nl RAM, nl gait   Lab Results: LabObservation:  07-Jan-15 10:03   OBSERVATION Reason for Test  Hepatic:  06-Jan-15 18:08   Bilirubin, Total 0.3  Alkaline Phosphatase  123 (45-117 NOTE: New Reference Range 08/16/13)  SGPT (ALT) 45  SGOT (AST) 28  Total Protein, Serum 7.5  Albumin, Serum 4.1  Routine Chem:  06-Jan-15 18:08   Result Comment PT - SPECIMEN HEMOLYZED. CALLED TO ER AT  - 2000 10/01/2013 BY DJ.  Result(s) reported on 01 Oct 2013 at 08:08PM.  Glucose, Serum 77  BUN 16  Creatinine (comp)  1.47  Sodium, Serum 137  Potassium, Serum 4.4  Chloride, Serum 106  CO2, Serum  19  Calcium (Total), Serum 9.2  Osmolality (calc) 274  eGFR (African American) >60  eGFR (Non-African American)  53 (eGFR values <27m/min/1.73 m2 may be an indication of chronic kidney disease (CKD). Calculated eGFR is useful in patients with stable renal function. The eGFR calculation will not be reliable in acutely ill patients when serum creatinine is changing rapidly. It is not useful in  patients on dialysis. The eGFR calculation may not be applicable to patients at the low and high extremes of body sizes, pregnant women, and vegetarians.)  Anion Gap 12  07-Jan-15 04:51   Cholesterol, Serum 143  Triglycerides, Serum 155  HDL (INHOUSE)  21  VLDL Cholesterol Calculated 31  LDL Cholesterol Calculated 91 (Result(s) reported on 02 Oct 2013 at 05:43AM.)  Cardiac:  06-Jan-15 18:08   Troponin I < 0.02 (0.00-0.05 0.05 ng/mL or less: NEGATIVE  Repeat testing in 3-6 hrs  if clinically indicated. >0.05 ng/mL: POTENTIAL  MYOCARDIAL INJURY. Repeat  testing in 3-6 hrs if  clinically indicated. NOTE: An increase or decrease  of 30% or more on serial  testing suggests a  clinically important change)  Routine UA:  06-Jan-15 22:47   Color (UA) Yellow  Clarity (UA) Hazy  Glucose (UA) Negative  Bilirubin (UA) Negative  Ketones (UA) Negative  Specific Gravity (UA) 1.016  Blood (UA) 1+  pH  (UA) 5.0  Protein (UA) Negative  Nitrite (UA) Negative  Leukocyte Esterase (UA) Negative (Result(s) reported on 01 Oct 2013 at 11:32PM.)  RBC (UA) 2 /HPF  WBC (UA) 6 /HPF  Bacteria (UA) NONE SEEN  Epithelial Cells (UA) NONE SEEN  Mucous (UA) PRESENT  Hyaline Cast (UA) 10 /LPF  Granular Cast (UA) 10 /LPF (Result(s) reported on 01 Oct 2013 at 11:32PM.)  Routine Coag:  06-Jan-15 20:01   Prothrombin 13.1  INR 1.0 (INR reference interval applies to patients on anticoagulant therapy. A single INR therapeutic range for coumarins is not optimal for all indications; however, the suggested range for most indications is 2.0 - 3.0. Exceptions to the INR Reference Range may include: Prosthetic heart valves, acute myocardial infarction, prevention of myocardial infarction, and combinations of aspirin and anticoagulant. The need for a higher or lower target INR must be assessed individually. Reference:  The Pharmacology and Management of the Vitamin K  antagonists: the seventh ACCP Conference on Antithrombotic and Thrombolytic Therapy. PZWCH.8527 Sept:126 (3suppl): [id number]. A HCT value >55% may artifactually increase the PT.  In one study,  the increase was an average of 25%. Reference:  "Effect on Routine and Special Coagulation Testing Values of Citrate Anticoagulant Adjustment in Patients with High HCT Values." American Journal of Clinical Pathology 2006;126:400-405.)  Routine Hem:  06-Jan-15 18:08   WBC (CBC)  25.7  RBC (CBC) 5.71  Hemoglobin (CBC) 15.4  Hematocrit (CBC) 47.7  Platelet Count (CBC) 314 (Result(s) reported on 01 Oct 2013 at 08:46PM.)  MCV 84  MCH 27.0  MCHC 32.3  RDW  14.8  08-Jan-15 03:04   Erythrocyte Sed Rate 2 (Result(s) reported on 03 Oct 2013 at 04:45AM.)   Radiology Results: [place]:    07-Jan-15 10:10, US Carotid Doppler Bilateral  US Carotid Doppler Bilateral   REASON FOR EXAM:    cva  COMMENTS:       PROCEDURE: [place]  - US CAROTID DOPPLER BILATERAL  - Oct 02 2013 10:10AM     CLINICAL DATA:  Stroke symptoms    EXAM:  BILATERAL CAROTID DUPLEX ULTRASOUND    TECHNIQUE:  Pearline Cables scale imaging, color Doppler and duplex ultrasound were  performed of bilateral carotid and vertebral arteries in the neck.    COMPARISON:  None.  FINDINGS:  Criteria: Quantification of carotid stenosis is based on velocity  parameters that correlate the residual internal carotid diameter  with NASCET-based stenosis levels, using the diameter of the distal  internal carotid lumen as the denominator for stenosis measurement.    The following velocity measurements were obtained:    RIGHT    ICA:  91 cm/sec    CCA:  98 cm/sec    SYSTOLICICA/CCA RATIO:  7.82  DIASTOLIC ICA/CCA RATIO:  1.2.    ECA:  121 cm/sec    LEFT    ICA:  120 cm/sec    CCA:  84 cm/sec    SYSTOLIC ICA/CCA RATIO:  1.4.    DIASTOLIC ICA/CCA RATIO:  1.7    ECA:  77 cm/sec  RIGHT CAROTID ARTERY: There is a small amount of soft and calcified  plaque diffusely on the right    RIGHT VERTEBRAL ARTERY: The vertebral artery on the right is normal  in flow direction    LEFT CAROTID ARTERY: There is a small amount of soft and calcified  plaque diffusely on the left.    LEFT VERTEBRAL ARTERY: The vertebral artery on the left is normal in  flow direction.     IMPRESSION:  There is no evidence of a hemodynamically significant carotid  stenosis. The vertebral arteries are normal in flow direction  bilaterally.      Electronically Signed    By: David  Martinique    On: 10/02/2013 10:20         Verified By: DAVID A. Martinique, M.D., MD  MRI:    07-Jan-15 09:10, MRI Brain Without Contrast  MRI Brain Without Contrast   REASON FOR EXAM:    CVA  COMMENTS:       PROCEDURE: MR  - MR BRAIN WO CONTRAST  - Oct 02 2013  9:10AM     CLINICAL DATA:  Dizziness.  Right-sided weakness.    EXAM:  MRI HEAD WITHOUT CONTRAST    TECHNIQUE:  Multiplanar, multiecho pulse sequences of the brain and  surrounding  structures were obtained without intravenous contrast.  COMPARISON:  CT 10/01/2013  FINDINGS:  Image quality degraded by mild motion.    Negative for acute infarct.    Ventricle size is normal. Cerebral volume normal for age. Small  infarct left frontal lobe. Cerebral white matter otherwise negative.  Brainstem and cerebellum are normal. Negative for hemorrhage or  mass.    Sinusitis with complete opacification of the right frontal sinus.  There is mucosal edema and air-fluid level in the right maxillary  sinus.     IMPRESSION:  No acute infarct    Sinusitis      Electronically Signed    By: Franchot Gallo M.D.    On: 10/02/2013 10:05         Verified By: Truett Perna, M.D.,  CT:    06-Jan-15 17:59, CT Head Without Contrast  CT Head Without Contrast   REASON FOR EXAM:    DIZZINESS/ SAME FEELING AS PRIOR CVA  COMMENTS:       PROCEDURE: CT  - CT HEAD WITHOUT CONTRAST  - Oct 01 2013  5:59PM     CLINICAL DATA:  Code stroke.  Dizziness.    EXAM:  CT HEAD WITHOUT CONTRAST    TECHNIQUE:  Contiguous axialimages were obtained from the base of the skull  through the vertex without intravenous contrast.    COMPARISON:  06/28/2012  FINDINGS:  The ventricles are normal in size and configuration. No extra-axial  fluid collections are identified. The gray-white differentiation is  normal. No CT findings for acute intracranial process such as  hemorrhage or infarction. Remote left frontal cortical infarct is  noted. No mass lesions. The brainstem and cerebellum are grossly  normal.    The bony structuresare intact. Chronic right frontal sinusitis an  evidence of prior sinonasal surgery on the right side. The globes  are intact.     IMPRESSION:  No acute intracranial findings or mass lesion.  Chronic right frontal sinusitis.      Electronically Signed    By: Kalman Jewels M.D.    On: 10/01/2013 18:01         Verified By: Marlane Hatcher,  M.D.,   Radiology Impression: Radiology Impression: MRI of brain personally reviewed by me and shows no acute infarct but there is an old L frontal infarct   Impression/Recommendations: Recommendations:   prior notes reviewed by me reviewed by me   Probable simple partial seizure-  given previous L frontal infarct and coorelation with symptoms to this lesion and the duration, there is a possibility that this could recur. 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- no clear etiology add Trileptal 161m BID x 3 days, then 3055mBID continue ASA and statin pt warned against driving or operating heavy machinery needs Neurology f/u in 1-2 months will sign off  Electronic Signatures: SmJamison NeighborMD)  (Signed 08-Jan-15 15:30)  Authored: REFERRING PHYSICIAN, Primary Care Physician, Consult, History of Present Illness, Review of Systems, PAST MEDICAL/SURGICAL HISTORY, HOME MEDICATIONS, ALLERGIES, Social/Family History, NURSING VITAL SIGNS, Physical Exam-, LAB RESULTS, RADIOLOGY RESULTS, Recommendations   Last Updated: 08-Jan-15 15:30 by SmJamison NeighborMD)

## 2015-07-04 ENCOUNTER — Emergency Department
Admission: EM | Admit: 2015-07-04 | Discharge: 2015-07-04 | Disposition: A | Discharge status: Home / Self Care | Payer: Medicaid Other | Attending: Emergency Medicine | Admitting: Emergency Medicine

## 2015-07-04 ENCOUNTER — Encounter: Payer: Self-pay | Admitting: *Deleted

## 2015-07-04 ENCOUNTER — Emergency Department: Payer: Medicaid Other

## 2015-07-04 DIAGNOSIS — I10 Essential (primary) hypertension: Secondary | ICD-10-CM | POA: Diagnosis not present

## 2015-07-04 DIAGNOSIS — Z7951 Long term (current) use of inhaled steroids: Secondary | ICD-10-CM | POA: Diagnosis not present

## 2015-07-04 DIAGNOSIS — J321 Chronic frontal sinusitis: Secondary | ICD-10-CM | POA: Insufficient documentation

## 2015-07-04 DIAGNOSIS — Z79899 Other long term (current) drug therapy: Secondary | ICD-10-CM | POA: Insufficient documentation

## 2015-07-04 DIAGNOSIS — Z792 Long term (current) use of antibiotics: Secondary | ICD-10-CM | POA: Insufficient documentation

## 2015-07-04 DIAGNOSIS — R51 Headache: Secondary | ICD-10-CM | POA: Diagnosis present

## 2015-07-04 DIAGNOSIS — Z791 Long term (current) use of non-steroidal anti-inflammatories (NSAID): Secondary | ICD-10-CM | POA: Diagnosis not present

## 2015-07-04 DIAGNOSIS — Z7982 Long term (current) use of aspirin: Secondary | ICD-10-CM | POA: Insufficient documentation

## 2015-07-04 DIAGNOSIS — R519 Headache, unspecified: Secondary | ICD-10-CM

## 2015-07-04 HISTORY — DX: Essential (primary) hypertension: I10

## 2015-07-04 HISTORY — DX: Cerebral infarction, unspecified: I63.9

## 2015-07-04 LAB — BASIC METABOLIC PANEL
Anion gap: 8 (ref 5–15)
BUN: 9 mg/dL (ref 6–20)
CHLORIDE: 104 mmol/L (ref 101–111)
CO2: 27 mmol/L (ref 22–32)
Calcium: 9 mg/dL (ref 8.9–10.3)
Creatinine, Ser: 0.91 mg/dL (ref 0.61–1.24)
GFR calc non Af Amer: >60 mL/min (ref >60)
GLUCOSE: 143 mg/dL — AB (ref 65–99)
POTASSIUM: 3.1 mmol/L — AB (ref 3.5–5.1)
Sodium: 139 mmol/L (ref 135–145)

## 2015-07-04 LAB — CBC
HCT: 45.8 % (ref 40.0–52.0)
HEMOGLOBIN: 14.6 g/dL (ref 13.0–18.0)
MCH: 27.2 pg (ref 26.0–34.0)
MCHC: 31.9 g/dL — ABNORMAL LOW (ref 32.0–36.0)
MCV: 85.2 fL (ref 80.0–100.0)
Platelets: 276 10*3/uL (ref 150–440)
RBC: 5.37 MIL/uL (ref 4.40–5.90)
RDW: 14.7 % — ABNORMAL HIGH (ref 11.5–14.5)
WBC: 18 10*3/uL — ABNORMAL HIGH (ref 3.8–10.6)

## 2015-07-04 MED ORDER — HYDROCODONE-ACETAMINOPHEN 5-325 MG PO TABS
2.0000 | ORAL_TABLET | Freq: Once | ORAL | Status: AC
  Administered 2015-07-04: 2 via ORAL
  Filled 2015-07-04: qty 2

## 2015-07-04 MED ORDER — DOXYCYCLINE HYCLATE 100 MG PO CAPS: 100.0000 mg | ORAL_CAPSULE | Freq: Two times a day (BID) | ORAL | Status: DC

## 2015-07-04 MED ORDER — HYDROCODONE-ACETAMINOPHEN 5-325 MG PO TABS: 1.0000 | ORAL_TABLET | Freq: Four times a day (QID) | ORAL | Status: DC | PRN

## 2015-07-04 MED ORDER — DEXAMETHASONE SODIUM PHOSPHATE 10 MG/ML IJ SOLN
INTRAMUSCULAR | Status: AC
  Administered 2015-07-04: 10 mg via INTRAVENOUS
  Filled 2015-07-04: qty 1

## 2015-07-04 MED ORDER — DEXAMETHASONE SODIUM PHOSPHATE 10 MG/ML IJ SOLN
10.0000 mg | Freq: Once | INTRAMUSCULAR | Status: AC
  Administered 2015-07-04: 10 mg via INTRAVENOUS
  Filled 2015-07-04: qty 1

## 2015-07-04 MED ORDER — MORPHINE SULFATE (PF) 4 MG/ML IV SOLN
4.0000 mg | Freq: Once | INTRAVENOUS | Status: AC
Start: 2015-07-04 — End: 2015-07-04
  Administered 2015-07-04: 4 mg via INTRAVENOUS
  Filled 2015-07-04: qty 1

## 2015-07-04 MED ORDER — ONDANSETRON HCL 4 MG/2ML IJ SOLN
4.0000 mg | INTRAMUSCULAR | Status: AC
  Administered 2015-07-04: 4 mg via INTRAVENOUS
  Filled 2015-07-04: qty 2

## 2015-07-04 MED ORDER — VANCOMYCIN HCL IN DEXTROSE 1-5 GM/200ML-% IV SOLN
1000.0000 mg | Freq: Once | INTRAVENOUS | Status: AC
  Administered 2015-07-04: 1000 mg via INTRAVENOUS
  Filled 2015-07-04: qty 200

## 2015-07-04 NOTE — ED Provider Notes (Signed)
[facility] Emergency Department Provider Note REMINDER - THIS NOTE IS NOT A FINAL MEDICAL RECORD UNTIL IT IS SIGNED. UNTIL THEN, THE CONTENT BELOW MAY REFLECT INFORMATION FROM A DOCUMENTATION TEMPLATE, NOT THE ACTUAL PATIENT VISIT. ____________________________________________  Time seen: Approximately 5:36 PM  I have reviewed the triage vital signs and the nursing notes.   HISTORY  Chief Complaint Headache    HPI Mark Vang is a 53 y.o. male reports a history of a previous stroke left her with some slight weakness in the right arm and right leg. In addition reports a long history of sinus problems and is supposed to have surgery coming up in a week. He reports that he stopped his aspirin 3 days ago at the request of his surgeon, and he has since developed increasing pain over his forehead and behind both eyes.  Patient also reports he was on a 3 week course of doxycycline and stopped this approximately 4 days ago.  Does report chronic sinus pain over the right frontal area that is throbbing in nature and reports that this pain now seems to have moved over towards the left side as well. No nausea or vomiting. No fevers or chills. No chest pain or trouble breathing. Does report he feels very significant sense of pressure across the forehead.  Pain is not worsened by light or bright noise. No neck pain. Reports a headache associated is been progressively increasing slowly over the last 3 days.  No new weakness, no numbness, no tingling, no facial droop. Does report chronic weakness in the right upper arm and right leg which is unchanged.  Past Medical History  Diagnosis Date  . Stroke ([place])   . Hypertension     There are no active problems to display for this patient.   Past Surgical History  Procedure Laterality Date  . Knee surgery    . Nasal sinus surgery      Current Outpatient Rx  Name  Route  Sig  Dispense  Refill  . albuterol (PROVENTIL)  (2.5 MG/3ML) 0.083% nebulizer solution   Nebulization   Take 2.5 mg by nebulization every 6 (six) hours as needed for wheezing or shortness of breath.         Marland Kitchen aspirin (ASPIRIN EC) 81 MG EC tablet   Oral   Take 81 mg by mouth daily. Swallow whole.         Marland Kitchen atorvastatin (LIPITOR) 40 MG tablet   Oral   Take 40 mg by mouth daily.         . cyclobenzaprine (FLEXERIL) 10 MG tablet   Oral   Take 10 mg by mouth 3 (three) times daily as needed for muscle spasms.         Marland Kitchen doxycycline (VIBRA-TABS) 100 MG tablet   Oral   Take 100 mg by mouth 2 (two) times daily.         . fluticasone (FLONASE) 50 MCG/ACT nasal spray   Each Nare   Place into both nostrils daily.         Marland Kitchen lisinopril (PRINIVIL,ZESTRIL) 20 MG tablet   Oral   Take 20 mg by mouth daily.         Marland Kitchen loratadine (CLARITIN) 10 MG tablet   Oral   Take 10 mg by mouth daily.         . naproxen (NAPROSYN) 250 MG tablet   Oral   Take by mouth 2 (two) times daily with a meal.         .  pantoprazole (PROTONIX) 40 MG tablet   Oral   Take 40 mg by mouth daily.         Marland Kitchen tiotropium (SPIRIVA) 18 MCG inhalation capsule   Inhalation   Place 18 mcg into inhaler and inhale daily.         Marland Kitchen doxycycline (VIBRAMYCIN) 100 MG capsule   Oral   Take 1 capsule (100 mg total) by mouth 2 (two) times daily.   20 capsule   0   . HYDROcodone-acetaminophen (NORCO/VICODIN) 5-325 MG tablet   Oral   Take 1 tablet by mouth every 6 (six) hours as needed for moderate pain.   15 tablet   0     Allergies Amlodipine besy-benazepril hcl  No family history on file.  Social History Social History  Substance Use Topics  . Smoking status: Never Smoker   . Smokeless tobacco: None  . Alcohol Use: No    Review of Systems Constitutional: No fever/chills Eyes: No visual changes. ENT: No sore throat. Cardiovascular: Denies chest pain. Respiratory: Denies shortness of breath. Gastrointestinal: No abdominal pain.  No  nausea, no vomiting.  No diarrhea.  No constipation. Genitourinary: Negative for dysuria. Musculoskeletal: Negative for back pain. Skin: Negative for rash. Neurological: Negative for new weakness or numbness.  10-point ROS otherwise negative.  ____________________________________________   PHYSICAL EXAM:  VITAL SIGNS: ED Triage Vitals  Enc Vitals Group     BP 07/04/15 1153 139/87 mmHg     Pulse Rate 07/04/15 1153 110     Resp 07/04/15 1153 16     Temp 07/04/15 1153 98 F (36.7 C)     Temp Source 07/04/15 1153 Oral     SpO2 07/04/15 1153 96 %     Weight --      Height --      Head Cir --      Peak Flow --      Pain Score 07/04/15 1153 10     Pain Loc --      Pain Edu? --      Excl. in [place]? --    Constitutional: Alert and oriented. Well appearing and in no acute distress. Eyes: Conjunctivae are normal. PERRL. EOMI. Head: Atraumatic. Patient has significant tenderness over the frontal sinuses to palpation. This worsens his symptoms. Nose: No congestion/rhinnorhea. Mouth/Throat: Mucous membranes are moist.  Oropharynx non-erythematous. Neck: No stridor.  No meningismus. No photophobia. Cardiovascular: Normal rate, regular rhythm. Grossly normal heart sounds.  Good peripheral circulation. Respiratory: Normal respiratory effort.  No retractions. Lungs CTAB. Gastrointestinal: Soft and nontender. No distention. No abdominal bruits. No CVA tenderness. Musculoskeletal: No lower extremity tenderness nor edema.  No joint effusions. Neurologic:  Normal speech and language. No gross focal neurologic deficits are appreciated except for some minimal weakness in the right forearm and hand as well as 4 out of 5 strength in the right leg which the patient reports to me as being chronic since his previous stroke. No gait instability. The patient has normal strength in the left upper arm and right leg. Normal cranial nerves. No pronator drift. Skin:  Skin is warm, dry and intact. No rash  noted. Psychiatric: Mood and affect are normal. Speech and behavior are normal.  ____________________________________________   LABS (all labs ordered are listed, but only abnormal results are displayed)  Labs Reviewed  CBC - Abnormal; Notable for the following:    WBC 18.0 (*)    MCHC 31.9 (*)    RDW 14.7 (*)    All  other components within normal limits  BASIC METABOLIC PANEL - Abnormal; Notable for the following:    Potassium 3.1 (*)    Glucose, Bld 143 (*)    All other components within normal limits   ____________________________________________  EKG   ____________________________________________  RADIOLOGY  CT Head Wo Contrast (Final result) Result time: 07/04/15 17:18:13   Final result by Rad Results In Interface (07/04/15 17:18:13)   Narrative:   CLINICAL DATA: Patient with head pressure.  EXAM: CT HEAD WITHOUT CONTRAST  TECHNIQUE: Contiguous axial images were obtained from the base of the skull through the vertex without intravenous contrast.  COMPARISON: CT brain 10/01/2013  FINDINGS: Ventricles and sulci are appropriate for patient's age. No evidence for acute cortically based infarct, intracranial hemorrhage, mass lesion or mass-effect. Orbits are unremarkable. Chronic opacification of the right frontal sinus. New mucosal thickening involving the left maxillary sinus and ethmoid air cells. Mastoid air cells are unremarkable. Calvarium is intact.  IMPRESSION: No acute intracranial process.  Chronic right frontal sinusitis nail with associated mucosal thickening involving the left maxillary sinus and ethmoid air cells.    ____________________________________________   PROCEDURES  Procedure(s) performed: None  Critical Care performed: No  ____________________________________________   INITIAL IMPRESSION / ASSESSMENT AND PLAN / ED COURSE  Pertinent labs & imaging results that were available during my care of the patient were reviewed  by me and considered in my medical decision making (see chart for details).  She resents with increasing frontal head pressure. He does have a history of previous stroke, but appears to be at his neurologic baseline at this time without evidence of any new infarct or neurologic deficit. I will obtain CT head to rule out intracranial hemorrhage, though I find it is far less likely and based on his symptomatology and exam I suspect he is likely suffering from increased frontal sinus pressure secondary to his known infection. He is not having a fever.  ----------------------------------------- 6:12 PM on 07/04/2015 -----------------------------------------  Patient reports his symptoms are much better. He is resting comfortably. I reviewed his CT scan and exam, and in discussion it seems that the reasonable process would be to give him prescription for short course of pain medication for which I discussed the risks, benefits and not driving while using. In addition, I have called our ear nose and throat physician to discuss possibly extending his antibiotic's. He does have a leukocytosis, and known sinus infection of chronic nature.  We discussed the patient is at risk for having a stroke since he is off his aspirin therapy, the patient understands this and had this explained to him by his doctor previously. He discussed that if he is develop any sudden onset of numbness, tingling, weakness, facial droop or trouble speaking he would return emergency room right away. He is still planning to have surgery done on this upcoming Friday.  And discussed with Dr. Nena Jordan recommends given the patient a dose of Decadron and vancomycin and resuming him on doxycycline. He will notify Dr. Tami Ribas for close follow-up. Advises that this is likely worsening of his sinusitis secondary to his continuing antibiotic's. Seeing this is probably a reasonable assessment, patient has no evidence of acute neurologic abnormality or  stroke. I'll discharge the patient to home. Close follow-up care advise. Patient's friend is driving.  I will prescribe the patient a narcotic pain medicine due to their condition which I anticipate will cause at least moderate pain short term. I discussed with the patient safe use of narcotic pain  medicines, and that they are not to drive, work in dangerous areas, or ever take more than prescribed (no more than 1 pill every 6 hours). We discussed that this is the type of medication that "Alfonse Spruce" may have overdosed on and the risks of this type of medicine. Patient is very agreeable to only use as prescribed and to never use more than prescribed.  ____________________________________________   FINAL CLINICAL IMPRESSION(S) / ED DIAGNOSES  Final diagnoses:  Chronic frontal sinusitis  Frontal headache      Delman Kitten, MD 07/04/15 1911

## 2015-07-04 NOTE — Discharge Instructions (Signed)
Call Dr. Tami Ribas Monday for follow-up. Return to the emergency room right away if you develop any numbness, weakness, facial droop, confusion, fever, or new concerns arise.  Sinus Headache A sinus headache occurs when the paranasal sinuses become clogged or swollen. Paranasal sinuses are air pockets within the bones of the face. Sinus headaches can range from mild to severe. CAUSES A sinus headache can result from various conditions that affect the sinuses, such as:  Colds.  Sinus infections.  Allergies. SYMPTOMS The main symptom of this condition is a headache that may feel like pain or pressure in the face, forehead, ears, or upper teeth. People who have a sinus headache often have other symptoms, such as:  Congested or runny nose.  Fever.  Inability to smell. Weather changes can make symptoms worse. DIAGNOSIS This condition may be diagnosed based on:  A physical exam and medical history.  Imaging tests, such as a CT scan and MRI, to check for problems with the sinuses.  A specialist may look into the sinuses with a tool that has a camera (endoscopy). TREATMENT Treatment for this condition depends on the cause.  Sinus pain that is caused by a sinus infection may be treated with antibiotic medicine.  Sinus pain that is caused by allergies may be helped by allergy medicines (antihistamines) and medicated nasal sprays.  Sinus pain that is caused by congestion may be helped by flushing the nose and sinuses with saline solution. HOME CARE INSTRUCTIONS  Take medicines only as directed by your health care provider.  If you were prescribed an antibiotic medicine, finish all of it even if you start to feel better.  If you have congestion, use a nasal spray to help reduce pressure.  If directed, apply a warm, moist washcloth to your face to help relieve pain. SEEK MEDICAL CARE IF:  You have headaches more than one time each week.  You have sensitivity to light or  sound.  You have a fever.  You feel sick to your stomach (nauseous) or you throw up (vomit).  Your headaches do not get better with treatment. Many people think that they have a sinus headache when they actually have migraines or tension headaches. SEEK IMMEDIATE MEDICAL CARE IF:  You have vision problems.  You have sudden, severe pain in your face or head.  You have a seizure.  You are confused.  You have a stiff neck.   This information is not intended to replace advice given to you by your health care provider. Make sure you discuss any questions you have with your health care provider.   Document Released: 10/20/2004 Document Revised: 01/27/2015 Document Reviewed: 09/08/2014 Elsevier Interactive Patient Education [employer].

## 2015-07-04 NOTE — ED Notes (Signed)
Pt reports pressure in head due to stopping taking aspirin in preparation for sinus surgery next week. Pressure across forehead, behind eyes.

## 2015-07-04 NOTE — ED Notes (Signed)
Pt transported to CT ?

## 2015-07-11 ENCOUNTER — Emergency Department
Admission: EM | Admit: 2015-07-11 | Discharge: 2015-07-11 | Disposition: A | Discharge status: Home / Self Care | Payer: Medicaid Other | Attending: Student | Admitting: Student

## 2015-07-11 ENCOUNTER — Encounter: Payer: Self-pay | Admitting: Emergency Medicine

## 2015-07-11 DIAGNOSIS — Z792 Long term (current) use of antibiotics: Secondary | ICD-10-CM | POA: Insufficient documentation

## 2015-07-11 DIAGNOSIS — R202 Paresthesia of skin: Secondary | ICD-10-CM | POA: Insufficient documentation

## 2015-07-11 DIAGNOSIS — I10 Essential (primary) hypertension: Secondary | ICD-10-CM | POA: Diagnosis not present

## 2015-07-11 DIAGNOSIS — Z7982 Long term (current) use of aspirin: Secondary | ICD-10-CM | POA: Insufficient documentation

## 2015-07-11 DIAGNOSIS — R531 Weakness: Secondary | ICD-10-CM | POA: Insufficient documentation

## 2015-07-11 DIAGNOSIS — Z79899 Other long term (current) drug therapy: Secondary | ICD-10-CM | POA: Diagnosis not present

## 2015-07-11 DIAGNOSIS — Z7951 Long term (current) use of inhaled steroids: Secondary | ICD-10-CM | POA: Insufficient documentation

## 2015-07-11 DIAGNOSIS — R200 Anesthesia of skin: Secondary | ICD-10-CM | POA: Diagnosis present

## 2015-07-11 DIAGNOSIS — Z720 Tobacco use: Secondary | ICD-10-CM | POA: Diagnosis not present

## 2015-07-11 NOTE — ED Notes (Signed)
Patient reassured that he has good blood flow by both myself and MD. Patient able to hold his cell phone to his ear briefly, but was not able to get it out of his holster.

## 2015-07-11 NOTE — Discharge Instructions (Signed)
Return immediately to the emergency department if he develops severe or worsening numbness/weakness in your hand or you develop any numbness or weakness in any other part of your body, slurred speech, facial droop, severe headache, vomiting, vision change, if you have becomes blue in color or cold to the touch, or for any other concerns. Otherwise follow up with your surgeons and a neurologist as soon as possible.

## 2015-07-11 NOTE — ED Provider Notes (Signed)
[facility] Emergency Department Provider Note  ____________________________________________  Time seen: Approximately 11:01 AM  I have reviewed the triage vital signs and the nursing notes.   HISTORY  Chief Complaint Numbness    HPI Mark Vang is a 53 y.o. 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 The patient is concerned because yesterday during the postoperative period at [facility], he reports his blood pressure cuff was askew on the right upper extremity, had slipped down into the antecubital fossa and was inflated for what he determined was a prolonged period of time. He reports that since that time he has had worsening numbness and weakness in the right arm/hand. No speech difficulty, no worsening weakness in the right lower extremity or any development of weakness on the left side. No speech difficulty. No severe headache. No vision change. He is concerned that he "lost the blood flow" in his right arm. Currently his symptoms are moderate. There are no modifying factors. No chest pain or difficulty breathing. He reports that he is otherwise recovering well from his operation.   Past Medical History  Diagnosis Date  . Stroke ([place])   . Hypertension     There are no active problems to display for this patient.   Past Surgical History  Procedure Laterality Date  . Knee surgery    . Nasal sinus surgery      Current Outpatient Rx  Name  Route  Sig  Dispense  Refill  . albuterol (PROVENTIL) (2.5 MG/3ML) 0.083% nebulizer solution   Nebulization   Take 2.5 mg by nebulization every 6 (six) hours as needed for wheezing or shortness of breath.         Marland Kitchen aspirin (ASPIRIN EC) 81 MG EC tablet    Oral   Take 81 mg by mouth daily. Swallow whole.         Marland Kitchen atorvastatin (LIPITOR) 40 MG tablet   Oral   Take 40 mg by mouth daily.         . cyclobenzaprine (FLEXERIL) 10 MG tablet   Oral   Take 10 mg by mouth 3 (three) times daily as needed for muscle spasms.         Marland Kitchen doxycycline (VIBRA-TABS) 100 MG tablet   Oral   Take 100 mg by mouth 2 (two) times daily.         Marland Kitchen doxycycline (VIBRAMYCIN) 100 MG capsule   Oral   Take 1 capsule (100 mg total) by mouth 2 (two) times daily.   20 capsule   0   . fluticasone (FLONASE) 50 MCG/ACT nasal spray   Each Nare   Place into both nostrils daily.         Marland Kitchen HYDROcodone-acetaminophen (NORCO/VICODIN) 5-325 MG tablet   Oral   Take 1 tablet by mouth every 6 (six) hours as needed for moderate pain.   15 tablet   0   . lisinopril (PRINIVIL,ZESTRIL) 20 MG tablet   Oral   Take 20 mg by mouth daily.         Marland Kitchen loratadine (CLARITIN) 10 MG tablet   Oral   Take 10 mg by mouth daily.         . naproxen (NAPROSYN) 250 MG tablet   Oral  Take by mouth 2 (two) times daily with a meal.         . pantoprazole (PROTONIX) 40 MG tablet   Oral   Take 40 mg by mouth daily.         Marland Kitchen tiotropium (SPIRIVA) 18 MCG inhalation capsule   Inhalation   Place 18 mcg into inhaler and inhale daily.           Allergies Amlodipine besy-benazepril hcl  Family History  Problem Relation Age of Onset  . Cancer Mother   . Cancer Father     Social History Social History  Substance Use Topics  . Smoking status: Current Every Day Smoker  . Smokeless tobacco: None  . Alcohol Use: No    Review of Systems Constitutional: No fever/chills Eyes: No visual changes. ENT: No sore throat. Cardiovascular: Denies chest pain. Respiratory: Denies shortness of breath. Gastrointestinal: No abdominal pain.  No nausea, no vomiting.  No diarrhea.  No constipation. Genitourinary: Negative for dysuria. Musculoskeletal: Negative for back  pain. Skin: Negative for rash. Neurological: Negative for headaches, positive for focal weakness and numbness.  10-point ROS otherwise negative.  ____________________________________________   PHYSICAL EXAM:  VITAL SIGNS: ED Triage Vitals  Enc Vitals Group     BP 07/11/15 1056 152/83 mmHg     Pulse Rate 07/11/15 1056 99     Resp 07/11/15 1056 20     Temp 07/11/15 1056 98 F (36.7 C)     Temp Source 07/11/15 1056 Oral     SpO2 07/11/15 1056 100 %     Weight 07/11/15 1056 254 lb (115.214 kg)     Height 07/11/15 1056 6' (1.829 m)     Head Cir --      Peak Flow --      Pain Score 07/11/15 1056 7     Pain Loc --      Pain Edu? --      Excl. in [place]? --     Constitutional: Alert and oriented. Well appearing and in no acute distress. Eyes: Conjunctivae are normal. PERRL. EOMI. Head: Atraumatic. Nose: Small bandage across the bridge of the nose. Both nares packed with surgical packing, no bleeding. Mouth/Throat: Mucous membranes are moist.  Oropharynx non-erythematous. Neck: No stridor.  Cardiovascular: Normal rate, regular rhythm. Grossly normal heart sounds.  Good peripheral circulation. Respiratory: Normal respiratory effort.  No retractions. Lungs CTAB. Gastrointestinal: Soft and nontender. No distention. No abdominal bruits. No CVA tenderness. Genitourinary: deferred Musculoskeletal: No lower extremity tenderness nor edema.  No joint effusions. Neurologic:  Normal speech and language. No gross focal neurologic deficits are appreciated except for minimal weakness in the right forearm with decreased sensation in the distribution of the median nerve. The patient also exhibits some weakness in the distribution of the median nerve on the right with some decreased strength when he attempts to make the"ok" sign and forcefully pinch the right index finger and right thumb together. Normal radial nerve and ulnar nerve function/sensation in the right hand. Patient reports that sensation in  the lower aspect of the hand/in the pinky finger is normal for him/unchanged since his prior stroke. Brisk cap refill in the right hand, 2+ right radial pulse. Minimal weakness in the right lower extremity which the patient reports is chronic. 5 out of 5 strength in the left upper and lower extremity. Cranial nerves II through XII intact. Skin:  Skin is warm, dry and intact. No rash noted. Psychiatric: Mood and affect are normal. Speech and behavior  are normal.   minimal weakness in the right forearm and hand as well as 4 out of 5 strength in the right leg which the patient reports to me as being chronic since his previous stroke. No gait instability. The patient has normal strength in the left upper arm and right leg. Normal cranial nerves. No pronator drift ____________________________________________   LABS (all labs ordered are listed, but only abnormal results are displayed)  Labs Reviewed - No data to display ____________________________________________  EKG  None ____________________________________________  RADIOLOGY  none ____________________________________________   PROCEDURES  Procedure(s) performed: None  Critical Care performed: No  ____________________________________________   INITIAL IMPRESSION / ASSESSMENT AND PLAN / ED COURSE  Pertinent labs & imaging results that were available during my care of the patient were reviewed by me and considered in my medical decision making (see chart for details).  Mark Vang is a 54 y.o. 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 On exam, he is very well-appearing and in no acute distress. Vital signs stable, he is afebrile. He has excellent blood flow to  the right hand and I am not concerned for any arterial occlusive disease. I reviewed his exam today and compared it to Dr. Malachi Bonds exam on 07/04/15 at which time the patient was seen in the emergency department for headache and had CT head showing known sinusitis. My exam today and the documented exam on 07/04/2015 are very similar with the exception of possibly some decreased function/sensation in the distribution of the median nerve. There is no arm pain with passive range of motion, this is not consistent with any compartment syndrome. His exam may be related to some degree of peripheral neuropathy however, again the does not appear dramatically changed from what was previously documented. He has no other symptoms. He is reassured at this time that he has good blood flow to the hand and I discussed with him that his symptoms may resolve but if not, he would need to follow up with a Neurologist in the coming weeks. I discussed this with Dr. Irish Elders of neurology who recommends outpatient neurology follow-up and possible EMG if his symptoms persist long-term. The patient is reassured. Today, I did recommend CT head to evaluate for any new infarct though his exam findings seem much more consistent with peripheral neuropathy than a central process like acute CVA (especially as sensation/function in the ulnar side of the arm does not appear affected).. The patient has refused CT head stating that he has had several over the past  2 weeks for evaluation for his sinustitis (he cites at least 3). He understands that there could be some undiagnosed/ new CVA missed without CT today but understands that this is much less likely. He reports to me that he will keep a watch on his symptoms and return for any worsening/new symptoms. We discussed return precautions and he has wife at bedside are comfortable with the discharge plan.   ____________________________________________   FINAL CLINICAL IMPRESSION(S) / ED  DIAGNOSES  Final diagnoses:  Paresthesias in right hand      Joanne Gavel, MD 07/11/15 1154

## 2015-07-11 NOTE — ED Notes (Signed)
Pt to ed with c/o nasal surgery yesterday. Pt states after surgery yesterday about 530 pm he started having right arm and hand numbness that has progressively gotten worse since last night.  Right hand warm to touch cap refill good, pt states he can not hold anything with right hand.

## 2015-10-31 ENCOUNTER — Emergency Department
Admission: EM | Admit: 2015-10-31 | Discharge: 2015-10-31 | Disposition: A | Discharge status: Home / Self Care | Payer: Medicaid Other | Attending: Emergency Medicine | Admitting: Emergency Medicine

## 2015-10-31 ENCOUNTER — Emergency Department: Payer: Medicaid Other

## 2015-10-31 ENCOUNTER — Encounter: Payer: Self-pay | Admitting: Emergency Medicine

## 2015-10-31 DIAGNOSIS — Z7982 Long term (current) use of aspirin: Secondary | ICD-10-CM | POA: Insufficient documentation

## 2015-10-31 DIAGNOSIS — J449 Chronic obstructive pulmonary disease, unspecified: Secondary | ICD-10-CM

## 2015-10-31 DIAGNOSIS — Z791 Long term (current) use of non-steroidal anti-inflammatories (NSAID): Secondary | ICD-10-CM | POA: Insufficient documentation

## 2015-10-31 DIAGNOSIS — Z79899 Other long term (current) drug therapy: Secondary | ICD-10-CM | POA: Insufficient documentation

## 2015-10-31 DIAGNOSIS — R042 Hemoptysis: Secondary | ICD-10-CM | POA: Diagnosis present

## 2015-10-31 DIAGNOSIS — Z7951 Long term (current) use of inhaled steroids: Secondary | ICD-10-CM | POA: Insufficient documentation

## 2015-10-31 DIAGNOSIS — I10 Essential (primary) hypertension: Secondary | ICD-10-CM | POA: Diagnosis not present

## 2015-10-31 DIAGNOSIS — J441 Chronic obstructive pulmonary disease with (acute) exacerbation: Secondary | ICD-10-CM | POA: Diagnosis not present

## 2015-10-31 DIAGNOSIS — F17210 Nicotine dependence, cigarettes, uncomplicated: Secondary | ICD-10-CM | POA: Diagnosis not present

## 2015-10-31 HISTORY — DX: Chronic obstructive pulmonary disease, unspecified: J44.9

## 2015-10-31 MED ORDER — HYDROCOD POLST-CPM POLST ER 10-8 MG/5ML PO SUER
5.0000 mL | Freq: Once | ORAL | Status: AC
  Administered 2015-10-31: 5 mL via ORAL
  Filled 2015-10-31: qty 5

## 2015-10-31 MED ORDER — PSEUDOEPH-BROMPHEN-DM 30-2-10 MG/5ML PO SYRP
5.0000 mL | ORAL_SOLUTION | Freq: Four times a day (QID) | ORAL | Status: DC | PRN
Start: 2015-10-31 — End: 2016-03-01

## 2015-10-31 MED ORDER — IPRATROPIUM-ALBUTEROL 0.5-2.5 (3) MG/3ML IN SOLN
3.0000 mL | Freq: Once | RESPIRATORY_TRACT | Status: AC
  Administered 2015-10-31: 3 mL via RESPIRATORY_TRACT
  Filled 2015-10-31: qty 3

## 2015-10-31 NOTE — ED Notes (Signed)
Patient transported to x-ray. ?

## 2015-10-31 NOTE — ED Provider Notes (Signed)
[facility] Emergency Department Provider Note  ____________________________________________  Time seen: Approximately 5:17 PM  I have reviewed the triage vital signs and the nursing notes.   HISTORY  Chief Complaint Cough    HPI Mark Vang is a 55 y.o. male patient complain cough and congestion for 1 week.Patient has COPD and a [place]. Patient has continued productive cough worsens with laying down. Patient denies any fever or chills associated this complaint. Patient takes an ACE inhibitor for his hypertension since 2012. Patient denies any pain with this complaint. Patient also state he does not have an albuterol inhaler. No palliative measures for this complaint.    Past Medical History  Diagnosis Date  . Stroke ([place])   . Hypertension   . COPD (chronic obstructive pulmonary disease) ([facility])     There are no active problems to display for this patient.   Past Surgical History  Procedure Laterality Date  . Knee surgery    . Nasal sinus surgery      Current Outpatient Rx  Name  Route  Sig  Dispense  Refill  . albuterol (PROVENTIL) (2.5 MG/3ML) 0.083% nebulizer solution   Nebulization   Take 2.5 mg by nebulization every 6 (six) hours as needed for wheezing or shortness of breath.         Marland Kitchen aspirin (ASPIRIN EC) 81 MG EC tablet   Oral   Take 81 mg by mouth daily. Swallow whole.         Marland Kitchen atorvastatin (LIPITOR) 40 MG tablet   Oral   Take 40 mg by mouth daily.         . cyclobenzaprine (FLEXERIL) 10 MG tablet   Oral   Take 10 mg by mouth 3 (three) times daily as needed for muscle spasms.         Marland Kitchen doxycycline (VIBRA-TABS) 100 MG tablet   Oral   Take 100 mg by mouth 2 (two) times daily.         Marland Kitchen doxycycline (VIBRAMYCIN) 100 MG capsule   Oral   Take 1 capsule (100 mg total) by mouth 2 (two) times daily.   20 capsule   0   . fluticasone (FLONASE) 50 MCG/ACT  nasal spray   Each Nare   Place into both nostrils daily.         Marland Kitchen HYDROcodone-acetaminophen (NORCO/VICODIN) 5-325 MG tablet   Oral   Take 1 tablet by mouth every 6 (six) hours as needed for moderate pain.   15 tablet   0   . lisinopril (PRINIVIL,ZESTRIL) 20 MG tablet   Oral   Take 20 mg by mouth daily.         Marland Kitchen loratadine (CLARITIN) 10 MG tablet   Oral   Take 10 mg by mouth daily.         . naproxen (NAPROSYN) 250 MG tablet   Oral   Take by mouth 2 (two) times daily with a meal.         . pantoprazole (PROTONIX) 40 MG tablet   Oral   Take 40 mg by mouth daily.         Marland Kitchen tiotropium (SPIRIVA) 18 MCG inhalation capsule   Inhalation   Place 18 mcg into inhaler and inhale daily.           Allergies Amlodipine besy-benazepril hcl  Family History  Problem Relation Age of Onset  . Cancer Mother   . Cancer  Father     Social History Social History  Substance Use Topics  . Smoking status: Current Every Day Smoker -- 1.00 packs/day    Types: Cigarettes  . Smokeless tobacco: None  . Alcohol Use: No    Review of Systems Constitutional: No fever/chills Eyes: No visual changes. ENT: No sore throat. Cardiovascular: Denies chest pain. Respiratory: Denies shortness of breath. Productive cough Gastrointestinal: No abdominal pain.  No nausea, no vomiting.  No diarrhea.  No constipation. Genitourinary: Negative for dysuria. Musculoskeletal: Negative for back pain. Skin: Negative for rash. Neurological: Negative for headaches, focal weakness or numbness. 10-point ROS otherwise negative.  ____________________________________________   PHYSICAL EXAM:  VITAL SIGNS: ED Triage Vitals  Enc Vitals Group     BP 10/31/15 1648 142/79 mmHg     Pulse Rate 10/31/15 1648 102     Resp 10/31/15 1648 18     Temp 10/31/15 1648 98.2 F (36.8 C)     Temp Source 10/31/15 1648 Oral     SpO2 10/31/15 1648 96 %     Weight 10/31/15 1648 256 lb (116.121 kg)     Height  10/31/15 1648 6' (1.829 m)     Head Cir --      Peak Flow --      Pain Score --      Pain Loc --      Pain Edu? --      Excl. in [place]? --     Constitutional: Alert and oriented. Well appearing and in no acute distress. Eyes: Conjunctivae are normal. PERRL. EOMI. Head: Atraumatic. Nose: No congestion/rhinnorhea. Mouth/Throat: Mucous membranes are moist.  Oropharynx non-erythematous. Neck: No stridor. No cervical spine tenderness to palpation. Hematological/Lymphatic/Immunilogical: No cervical lymphadenopathy. Cardiovascular: Normal rate, regular rhythm. Grossly normal heart sounds.  Good peripheral circulation. Respiratory: Normal respiratory effort.  No retractions. Bilateral wheezing. Gastrointestinal: Soft and nontender. No distention. No abdominal bruits. No CVA tenderness. Musculoskeletal: No lower extremity tenderness nor edema.  No joint effusions. Neurologic:  Normal speech and language. No gross focal neurologic deficits are appreciated. No gait instability. Skin:  Skin is warm, dry and intact. No rash noted. Psychiatric: Mood and affect are normal. Speech and behavior are normal.  ____________________________________________   LABS (all labs ordered are listed, but only abnormal results are displayed)  Labs Reviewed - No data to display ____________________________________________  EKG   ____________________________________________  RADIOLOGY  Chest x-ray showed hyperinflation of the lungs but no other acute findings. ____________________________________________   PROCEDURES  Procedure(s) performed: None  Critical Care performed: No  ____________________________________________   INITIAL IMPRESSION / ASSESSMENT AND PLAN / ED COURSE  Pertinent labs & imaging results that were available during my care of the patient were reviewed by me and considered in my medical decision making (see chart for details).  COPD. Patient decreased wheezing after one DuoNeb  treatment patient given discharge care instructions. Patient given a prescription for Bromfed-DM and advised [facility]. ____________________________________________   FINAL CLINICAL IMPRESSION(S) / ED DIAGNOSES  Final diagnoses:  COPD, moderate ([place])      Sable Feil, PA-C 10/31/15 1800  Lisa Roca, MD 11/03/15 1215

## 2015-10-31 NOTE — ED Notes (Signed)
Left nebulizer in [place] when he moved - cough, congestion x 1 week.

## 2016-02-27 ENCOUNTER — Encounter: Payer: Self-pay | Admitting: Emergency Medicine

## 2016-02-27 ENCOUNTER — Emergency Department
Admission: EM | Admit: 2016-02-27 | Discharge: 2016-02-27 | Disposition: A | Discharge status: Home / Self Care | Payer: Medicaid Other | Attending: Emergency Medicine | Admitting: Emergency Medicine

## 2016-02-27 DIAGNOSIS — Z7951 Long term (current) use of inhaled steroids: Secondary | ICD-10-CM | POA: Insufficient documentation

## 2016-02-27 DIAGNOSIS — Z7982 Long term (current) use of aspirin: Secondary | ICD-10-CM | POA: Insufficient documentation

## 2016-02-27 DIAGNOSIS — J449 Chronic obstructive pulmonary disease, unspecified: Secondary | ICD-10-CM | POA: Diagnosis not present

## 2016-02-27 DIAGNOSIS — Y92410 Unspecified street and highway as the place of occurrence of the external cause: Secondary | ICD-10-CM | POA: Insufficient documentation

## 2016-02-27 DIAGNOSIS — Z79899 Other long term (current) drug therapy: Secondary | ICD-10-CM | POA: Diagnosis not present

## 2016-02-27 DIAGNOSIS — Z8673 Personal history of transient ischemic attack (TIA), and cerebral infarction without residual deficits: Secondary | ICD-10-CM | POA: Insufficient documentation

## 2016-02-27 DIAGNOSIS — F17210 Nicotine dependence, cigarettes, uncomplicated: Secondary | ICD-10-CM | POA: Insufficient documentation

## 2016-02-27 DIAGNOSIS — Y939 Activity, unspecified: Secondary | ICD-10-CM | POA: Diagnosis not present

## 2016-02-27 DIAGNOSIS — Y999 Unspecified external cause status: Secondary | ICD-10-CM | POA: Diagnosis not present

## 2016-02-27 DIAGNOSIS — I10 Essential (primary) hypertension: Secondary | ICD-10-CM | POA: Insufficient documentation

## 2016-02-27 DIAGNOSIS — S39012A Strain of muscle, fascia and tendon of lower back, initial encounter: Secondary | ICD-10-CM | POA: Diagnosis not present

## 2016-02-27 DIAGNOSIS — S3992XA Unspecified injury of lower back, initial encounter: Secondary | ICD-10-CM | POA: Diagnosis present

## 2016-02-27 MED ORDER — NAPROXEN 500 MG PO TBEC: 500.0000 mg | DELAYED_RELEASE_TABLET | Freq: Two times a day (BID) | ORAL | Status: DC

## 2016-02-27 MED ORDER — HYDROCODONE-ACETAMINOPHEN 5-325 MG PO TABS: 1.0000 | ORAL_TABLET | Freq: Four times a day (QID) | ORAL | Status: DC | PRN
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
## 2016-02-27 NOTE — ED Notes (Signed)
E sig pad not working, pt verbalized understanding 
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
## 2016-03-01 ENCOUNTER — Emergency Department
Admission: EM | Admit: 2016-03-01 | Discharge: 2016-03-01 | Disposition: A | Discharge status: Home / Self Care | Payer: No Typology Code available for payment source | Attending: Emergency Medicine | Admitting: Emergency Medicine

## 2016-03-01 ENCOUNTER — Encounter: Payer: Self-pay | Admitting: Emergency Medicine

## 2016-03-01 ENCOUNTER — Emergency Department: Payer: No Typology Code available for payment source

## 2016-03-01 DIAGNOSIS — S39012A Strain of muscle, fascia and tendon of lower back, initial encounter: Secondary | ICD-10-CM | POA: Diagnosis not present

## 2016-03-01 DIAGNOSIS — Z79899 Other long term (current) drug therapy: Secondary | ICD-10-CM | POA: Insufficient documentation

## 2016-03-01 DIAGNOSIS — Y9389 Activity, other specified: Secondary | ICD-10-CM | POA: Diagnosis not present

## 2016-03-01 DIAGNOSIS — Z7951 Long term (current) use of inhaled steroids: Secondary | ICD-10-CM | POA: Insufficient documentation

## 2016-03-01 DIAGNOSIS — J449 Chronic obstructive pulmonary disease, unspecified: Secondary | ICD-10-CM | POA: Diagnosis not present

## 2016-03-01 DIAGNOSIS — Z7982 Long term (current) use of aspirin: Secondary | ICD-10-CM | POA: Insufficient documentation

## 2016-03-01 DIAGNOSIS — Y92410 Unspecified street and highway as the place of occurrence of the external cause: Secondary | ICD-10-CM | POA: Diagnosis not present

## 2016-03-01 DIAGNOSIS — S7001XA Contusion of right hip, initial encounter: Secondary | ICD-10-CM | POA: Diagnosis not present

## 2016-03-01 DIAGNOSIS — Y999 Unspecified external cause status: Secondary | ICD-10-CM | POA: Insufficient documentation

## 2016-03-01 DIAGNOSIS — I10 Essential (primary) hypertension: Secondary | ICD-10-CM | POA: Diagnosis not present

## 2016-03-01 DIAGNOSIS — F17210 Nicotine dependence, cigarettes, uncomplicated: Secondary | ICD-10-CM | POA: Insufficient documentation

## 2016-03-01 DIAGNOSIS — S161XXA Strain of muscle, fascia and tendon at neck level, initial encounter: Secondary | ICD-10-CM | POA: Diagnosis not present

## 2016-03-01 DIAGNOSIS — S7011XA Contusion of right thigh, initial encounter: Secondary | ICD-10-CM | POA: Insufficient documentation

## 2016-03-01 DIAGNOSIS — S169XXA Unspecified injury of muscle, fascia and tendon at neck level, initial encounter: Secondary | ICD-10-CM | POA: Diagnosis present

## 2016-03-01 DIAGNOSIS — Z8673 Personal history of transient ischemic attack (TIA), and cerebral infarction without residual deficits: Secondary | ICD-10-CM | POA: Diagnosis not present

## 2016-03-01 MED ORDER — CYCLOBENZAPRINE HCL 10 MG PO TABS: 10.0000 mg | ORAL_TABLET | Freq: Three times a day (TID) | ORAL | Status: DC | PRN

## 2016-03-01 MED ORDER — KETOROLAC TROMETHAMINE 60 MG/2ML IM SOLN
60.0000 mg | Freq: Once | INTRAMUSCULAR | Status: AC
  Administered 2016-03-01: 60 mg via INTRAMUSCULAR
  Filled 2016-03-01: qty 2

## 2016-03-01 MED ORDER — OXYCODONE HCL 5 MG PO CAPS: 5.0000 mg | ORAL_CAPSULE | ORAL | Status: DC | PRN

## 2016-03-01 MED ORDER — DIAZEPAM 5 MG PO TABS
5.0000 mg | ORAL_TABLET | Freq: Once | ORAL | Status: AC
  Administered 2016-03-01: 5 mg via ORAL
  Filled 2016-03-01: qty 1
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
## 2016-03-01 NOTE — ED Notes (Signed)
Patient transported to x-ray. ?

## 2016-06-14 ENCOUNTER — Ambulatory Visit: Payer: Self-pay | Admitting: Podiatry

## 2016-07-01 ENCOUNTER — Ambulatory Visit (INDEPENDENT_AMBULATORY_CARE_PROVIDER_SITE_OTHER): Payer: Medicaid Other | Admitting: Podiatry

## 2016-07-01 ENCOUNTER — Other Ambulatory Visit: Payer: Self-pay | Admitting: *Deleted

## 2016-07-01 ENCOUNTER — Ambulatory Visit (INDEPENDENT_AMBULATORY_CARE_PROVIDER_SITE_OTHER): Payer: Medicaid Other

## 2016-07-01 ENCOUNTER — Encounter: Payer: Self-pay | Admitting: *Deleted

## 2016-07-01 ENCOUNTER — Encounter: Payer: Self-pay | Admitting: Podiatry

## 2016-07-01 VITALS — BP 143/82 | HR 87 | Resp 16

## 2016-07-01 DIAGNOSIS — R569 Unspecified convulsions: Secondary | ICD-10-CM | POA: Insufficient documentation

## 2016-07-01 DIAGNOSIS — G5791 Unspecified mononeuropathy of right lower limb: Secondary | ICD-10-CM

## 2016-07-01 DIAGNOSIS — I639 Cerebral infarction, unspecified: Secondary | ICD-10-CM | POA: Insufficient documentation

## 2016-07-01 DIAGNOSIS — G5761 Lesion of plantar nerve, right lower limb: Secondary | ICD-10-CM

## 2016-07-01 DIAGNOSIS — M79671 Pain in right foot: Secondary | ICD-10-CM

## 2016-07-01 DIAGNOSIS — S99921A Unspecified injury of right foot, initial encounter: Secondary | ICD-10-CM | POA: Diagnosis not present

## 2016-07-01 DIAGNOSIS — J329 Chronic sinusitis, unspecified: Secondary | ICD-10-CM | POA: Insufficient documentation

## 2016-07-01 NOTE — Progress Notes (Signed)
   Subjective:    Patient ID: Mark Vang, male    DOB: 12/07/1960, 55 y.o.   MRN: [id number]  HPI    Review of Systems  HENT: Positive for hearing loss.   Respiratory: Positive for apnea, cough, shortness of breath and wheezing.   Musculoskeletal: Positive for myalgias.  Neurological: Positive for dizziness.  All other systems reviewed and are negative.      Objective:   Physical Exam        Assessment & Plan:

## 2016-07-02 MED ORDER — BETAMETHASONE SOD PHOS & ACET 6 (3-3) MG/ML IJ SUSP: 3.0000 mg | Freq: Once | INTRAMUSCULAR | Status: DC

## 2016-07-02 NOTE — Progress Notes (Signed)
Patient ID: Mark Vang, male   DOB: 19-Jan-1961, 55 y.o.   MRN: [id number] Subjective:  Patient presents today for pain and tenderness to the right forefoot. Patient states the pains been going on for proximal he 4 months now. Patient notices the pain approximate 6-10 times per day at which time he experiences severe pain which last for about 20 minutes. Patient states that he screams because it hurts so bad. Patient does relate a history of sciatic problems to the right hip.  Patient presents today for further treatment and evaluation    Objective/Physical Exam General: The patient is alert and oriented x3 in no acute distress.  Dermatology: Skin is warm, dry and supple bilateral lower extremities. Negative for open lesions or macerations.  Vascular: Palpable pedal pulses bilaterally. No edema or erythema noted. Capillary refill within normal limits.  Neurological: Epicritic and protective threshold grossly intact bilaterally.   Musculoskeletal Exam:  Pain on palpation and range of motion to the third and fourth MPJs of the right foot. Severe pain on palpation noted with compression of the metatarsal heads and palpation of the third interspace right foot. Positive Mulder sign.  Range of motion within normal limits to all pedal and ankle joints bilateral. Muscle strength 5/5 in all groups bilateral.   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 #2 today injection of 0.5 mL Celestone Soluspan injected into the third interspace of the right foot. #3 prescription for neuropathic pain cream was dispensed through [facility] #4 patient is to return to clinic in 4 weeks   Dr. Edrick Kins, [place]

## 2016-07-04 ENCOUNTER — Telehealth: Payer: Self-pay | Admitting: *Deleted

## 2016-07-04 NOTE — Telephone Encounter (Addendum)
Pt states his insurance will not cover the rx, that is to be mailed to pt, pt would like an alternative. 07/06/2016-Informed pt that Dr. Amalia Hailey stated it was not necessary for a substitute if pt was unable to afford.

## 2016-07-05 NOTE — Telephone Encounter (Signed)
No. Rx was for neuropathic pain cream via [facility] [employer]. Not necessary for other substitute.

## 2016-07-29 ENCOUNTER — Encounter: Payer: Self-pay | Admitting: Podiatry

## 2016-07-29 ENCOUNTER — Ambulatory Visit (INDEPENDENT_AMBULATORY_CARE_PROVIDER_SITE_OTHER): Payer: Medicaid Other | Admitting: Podiatry

## 2016-07-29 DIAGNOSIS — G5761 Lesion of plantar nerve, right lower limb: Secondary | ICD-10-CM | POA: Diagnosis not present

## 2016-07-29 DIAGNOSIS — R202 Paresthesia of skin: Secondary | ICD-10-CM

## 2016-07-29 DIAGNOSIS — G5791 Unspecified mononeuropathy of right lower limb: Secondary | ICD-10-CM

## 2016-07-29 MED ORDER — MELOXICAM 15 MG PO TABS: 15.0000 mg | ORAL_TABLET | Freq: Every day | ORAL | 1 refills | Status: AC
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
## 2016-08-01 ENCOUNTER — Telehealth: Payer: Self-pay | Admitting: Podiatry

## 2016-08-01 NOTE — Telephone Encounter (Signed)
Not a letter, just the last progress note written on 07/29/16. Progress note is completed.  Dr. Amalia Hailey

## 2016-08-01 NOTE — Telephone Encounter (Signed)
Patient was seen on 07/29/16 and he came by the office to pick up a letter that you were writing for him over the weekend concerning the numbness in his toes. I dont have a letter here for him, advised I would send a message asking about the letter so it could be written and patient notified he could pick up in [place] office.

## 2016-08-06 ENCOUNTER — Encounter: Payer: Self-pay | Admitting: Emergency Medicine

## 2016-08-06 ENCOUNTER — Emergency Department
Admission: EM | Admit: 2016-08-06 | Discharge: 2016-08-06 | Disposition: A | Discharge status: Home / Self Care | Payer: Medicaid Other | Attending: Emergency Medicine | Admitting: Emergency Medicine

## 2016-08-06 ENCOUNTER — Emergency Department: Payer: Medicaid Other

## 2016-08-06 DIAGNOSIS — R0602 Shortness of breath: Secondary | ICD-10-CM | POA: Diagnosis present

## 2016-08-06 DIAGNOSIS — F17210 Nicotine dependence, cigarettes, uncomplicated: Secondary | ICD-10-CM | POA: Insufficient documentation

## 2016-08-06 DIAGNOSIS — J441 Chronic obstructive pulmonary disease with (acute) exacerbation: Secondary | ICD-10-CM | POA: Diagnosis not present

## 2016-08-06 DIAGNOSIS — Z79899 Other long term (current) drug therapy: Secondary | ICD-10-CM | POA: Diagnosis not present

## 2016-08-06 DIAGNOSIS — Z7982 Long term (current) use of aspirin: Secondary | ICD-10-CM | POA: Insufficient documentation

## 2016-08-06 DIAGNOSIS — I10 Essential (primary) hypertension: Secondary | ICD-10-CM | POA: Insufficient documentation

## 2016-08-06 LAB — BLOOD GAS, VENOUS
ACID-BASE EXCESS: 1.3 mmol/L (ref 0.0–2.0)
Bicarbonate: 25.9 mmol/L (ref 20.0–28.0)
O2 Saturation: 90.2 %
PCO2 VEN: 40 mmHg — AB (ref 44.0–60.0)
PH VEN: 7.42 (ref 7.250–7.430)
Patient temperature: 37
pO2, Ven: 58 mmHg — ABNORMAL HIGH (ref 32.0–45.0)

## 2016-08-06 LAB — CBC
HEMATOCRIT: 46.1 % (ref 40.0–52.0)
Hemoglobin: 15.4 g/dL (ref 13.0–18.0)
MCH: 27.9 pg (ref 26.0–34.0)
MCHC: 33.3 g/dL (ref 32.0–36.0)
MCV: 83.8 fL (ref 80.0–100.0)
PLATELETS: 279 10*3/uL (ref 150–440)
RBC: 5.5 MIL/uL (ref 4.40–5.90)
RDW: 14.7 % — AB (ref 11.5–14.5)
WBC: 13.2 10*3/uL — AB (ref 3.8–10.6)

## 2016-08-06 LAB — BASIC METABOLIC PANEL
Anion gap: 9 (ref 5–15)
BUN: 9 mg/dL (ref 6–20)
CALCIUM: 9 mg/dL (ref 8.9–10.3)
CO2: 26 mmol/L (ref 22–32)
CREATININE: 1.16 mg/dL (ref 0.61–1.24)
Chloride: 105 mmol/L (ref 101–111)
GFR calc Af Amer: >60 mL/min (ref >60)
GLUCOSE: 97 mg/dL (ref 65–99)
POTASSIUM: 3.6 mmol/L (ref 3.5–5.1)
SODIUM: 140 mmol/L (ref 135–145)

## 2016-08-06 LAB — TROPONIN I

## 2016-08-06 MED ORDER — METHYLPREDNISOLONE SODIUM SUCC 125 MG IJ SOLR
125.0000 mg | Freq: Once | INTRAMUSCULAR | Status: AC
  Administered 2016-08-06: 125 mg via INTRAVENOUS
  Filled 2016-08-06: qty 2

## 2016-08-06 MED ORDER — PREDNISONE 20 MG PO TABS: 60.0000 mg | ORAL_TABLET | Freq: Every day | ORAL | 0 refills | Status: AC

## 2016-08-06 MED ORDER — LEVOFLOXACIN 750 MG PO TABS: 750.0000 mg | ORAL_TABLET | Freq: Every day | ORAL | 0 refills | Status: AC

## 2016-08-06 MED ORDER — ALBUTEROL SULFATE (2.5 MG/3ML) 0.083% IN NEBU
5.0000 mg | INHALATION_SOLUTION | Freq: Once | RESPIRATORY_TRACT | Status: AC
  Administered 2016-08-06: 5 mg via RESPIRATORY_TRACT
  Filled 2016-08-06: qty 6

## 2016-08-06 MED ORDER — LEVOFLOXACIN 750 MG PO TABS
750.0000 mg | ORAL_TABLET | Freq: Once | ORAL | Status: AC
  Administered 2016-08-06: 750 mg via ORAL
  Filled 2016-08-06 (×2): qty 1

## 2016-08-06 MED ORDER — IPRATROPIUM-ALBUTEROL 0.5-2.5 (3) MG/3ML IN SOLN
3.0000 mL | Freq: Once | RESPIRATORY_TRACT | Status: AC
  Administered 2016-08-06: 3 mL via RESPIRATORY_TRACT
  Filled 2016-08-06: qty 3

## 2016-08-06 NOTE — ED Provider Notes (Signed)
[facility] Emergency Department Provider Note  ____________________________________________  Time seen: Approximately 5:25 PM  I have reviewed the triage vital signs and the nursing notes.   HISTORY  Chief Complaint Shortness of Breath   HPI Mark Vang is a 55 y.o. male history of COPD, active smoker, hypertension, and CVA who presents for evaluation of shortness of breath and wheezing. Patient reports that 4 days ago he had 2 days of 5 episodes of watery diarrhea. Diarrhea resolved and for the last 2 days he has had wheezing and shortness of breath. Patient reports a dry cough but denies chills or fever, chest pain, nausea or vomiting, diarrhea. Patient has been using his pro-air which helps for 30 minutes but the wheezing returns. He continues to smoke. He denies hemoptysis, personal or family history of blood clots, recent travel immobilization, leg pain or swelling. Patient denies personal or family history of ischemic heart disease.  Past Medical History:  Diagnosis Date  . COPD (chronic obstructive pulmonary disease) ([place])   . Hypertension   . Stroke [facility])     Patient Active Problem List   Diagnosis Date Noted  . Arthritis 07/01/2016  . Seizure ([place]) 07/01/2016  . Sinusitis 07/01/2016  . Stroke ([place]) 07/01/2016  . COPD (chronic obstructive pulmonary disease) ([place]) 01/11/2013  . Dyspnea on exertion 01/11/2013  . GERD (gastroesophageal reflux disease) 01/11/2013  . HTN (hypertension) 01/11/2013  . Seizures ([place]) 01/11/2013  . Tobacco abuse 01/11/2013  . Chronic sinusitis 12/13/2012  . 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 KNEE SURGERY    . NASAL SINUS SURGERY      Prior to Admission medications   Medication Sig Start Date End Date Taking? Authorizing Provider  albuterol (PROVENTIL) (2.5 MG/3ML) 0.083% nebulizer solution Take 2.5 mg by nebulization every  6 (six) hours as needed for wheezing or shortness of breath.    Historical Provider, MD  aspirin (ASPIRIN EC) 81 MG EC tablet Take 81 mg by mouth daily. Swallow whole.    Historical Provider, MD  atorvastatin (LIPITOR) 40 MG tablet Take 40 mg by mouth daily.    Historical Provider, MD  cyclobenzaprine (FLEXERIL) 10 MG tablet Take 1 tablet (10 mg total) by mouth every 8 (eight) hours as needed for muscle spasms. 03/01/16   Pierce Crane Beers, PA-C  fluticasone (FLONASE) 50 MCG/ACT nasal spray Place into both nostrils daily.    Historical Provider, MD  levofloxacin (LEVAQUIN) 750 MG tablet Take 1 tablet (750 mg total) by mouth daily. 08/06/16 08/12/16  Rudene Re, MD  lisinopril (PRINIVIL,ZESTRIL) 20 MG tablet Take 20 mg by mouth daily.    Historical Provider, MD  loratadine (CLARITIN) 10 MG tablet Take 10 mg by mouth daily.    Historical Provider, MD  meloxicam (MOBIC) 15 MG tablet Take 1 tablet (15 mg total) by mouth daily. 07/29/16 08/28/16  Edrick Kins, DPM  naproxen (NAPROSYN) 250 MG tablet Take 250 mg by mouth.    Historical Provider, MD  NONFORMULARY OR COMPOUNDED ITEM Combination Pain Cream-[facility] 2 refills    Historical Provider, MD  Oxcarbazepine (TRILEPTAL) 300 MG tablet Take by mouth.    Historical Provider, MD  oxycodone (OXY-IR) 5 MG capsule Take 1 capsule (5 mg total) by mouth every 4 (four) hours as needed. 03/01/16   Pierce Crane Beers, PA-C  OXYGEN Inhale into the lungs.    Historical Provider, MD  pantoprazole (PROTONIX) 40 MG  tablet Take 40 mg by mouth daily.    Historical Provider, MD  predniSONE (DELTASONE) 20 MG tablet Take 3 tablets (60 mg total) by mouth daily. 08/06/16 08/10/16  Rudene Re, MD  tiotropium (SPIRIVA) 18 MCG inhalation capsule Place 18 mcg into inhaler and inhale daily.    Historical Provider, MD    Allergies Amlodipine besy-benazepril hcl and Amlodipine  Family History  Problem Relation Age of Onset  . Cancer Mother   . Cancer Father      Social History Social History  Substance Use Topics  . Smoking status: Current Every Day Smoker    Packs/day: 1.00    Types: Cigarettes  . Smokeless tobacco: Never Used  . Alcohol use No    Review of Systems  Constitutional: Negative for fever. Eyes: Negative for visual changes. ENT: Negative for sore throat. Cardiovascular: Negative for chest pain. Respiratory: + shortness of breath, wheezing, cough Gastrointestinal: Negative for abdominal pain, vomiting or diarrhea. Genitourinary: Negative for dysuria. Musculoskeletal: Negative for back pain. Skin: Negative for rash. Neurological: Negative for headaches, weakness or numbness.  ____________________________________________   PHYSICAL EXAM:  VITAL SIGNS: ED Triage Vitals  Enc Vitals Group     BP 08/06/16 1420 (!) 141/75     Pulse Rate 08/06/16 1420 93     Resp 08/06/16 1420 (!) 24     Temp 08/06/16 1420 98.4 F (36.9 C)     Temp Source 08/06/16 1420 Oral     SpO2 08/06/16 1420 98 %     Weight 08/06/16 1421 254 lb (115.2 kg)     Height 08/06/16 1421 6' (1.829 m)     Head Circumference --      Peak Flow --      Pain Score 08/06/16 1421 0     Pain Loc --      Pain Edu? --      Excl. in [place]? --     Constitutional: Alert and oriented. Well appearing and in no apparent distress. HEENT:      Head: Normocephalic and atraumatic.         Eyes: Conjunctivae are normal. Sclera is non-icteric. EOMI. PERRL      Mouth/Throat: Mucous membranes are moist.       Neck: Supple with no signs of meningismus. Cardiovascular: Regular rate and rhythm. No murmurs, gallops, or rubs. 2+ symmetrical distal pulses are present in all extremities. No JVD. Respiratory: Tachypneic, speaking in full sentences, diffuse expiratory wheezes bilaterally. Gastrointestinal: Soft, non tender, and non distended with positive bowel sounds. No rebound or guarding. Musculoskeletal: Nontender with normal range of motion in all extremities. No edema,  cyanosis, or erythema of extremities. Neurologic: Normal speech and language. Face is symmetric. Moving all extremities. No gross focal neurologic deficits are appreciated. Skin: Skin is warm, dry and intact. No rash noted. Psychiatric: Mood and affect are normal. Speech and behavior are normal.  ____________________________________________   LABS (all labs ordered are listed, but only abnormal results are displayed)  Labs Reviewed  CBC - Abnormal; Notable for the following:       Result Value   WBC 13.2 (*)    RDW 14.7 (*)    All other components within normal limits  BLOOD GAS, VENOUS - Abnormal; Notable for the following:    pCO2, Ven 40 (*)    pO2, Ven 58.0 (*)    All other components within normal limits  BASIC METABOLIC PANEL  TROPONIN I   ____________________________________________  EKG  ED ECG REPORT I,  Rudene Re, the attending physician, personally viewed and interpreted this ECG.  Normal sinus rhythm, rate of 86, normal intervals, normal axis, no ST elevations or depressions. ____________________________________________  RADIOLOGY  CXR: No active disease. ____________________________________________   PROCEDURES  Procedure(s) performed: None Procedures Critical Care performed:  None ____________________________________________   INITIAL IMPRESSION / ASSESSMENT AND PLAN / ED COURSE   55 y.o. male history of COPD, active smoker, hypertension, and CVA who presents for evaluation of shortness of breath and wheezing x 2 days consistent with a COPD exacerbation. Patient is satting well on room air however has increased work of breathing and diffuse expiratory wheezes. Patient will receive 3 DuoNeb treatments, Solu-Medrol, Levaquin. EKG with no evidence of ischemia. We'll check troponin, chest x-ray, and basic labs. We'll monitor patient on telemetry.  Clinical Course as of Aug 06 1845  Sat Aug 06, 2016  [place] Patient feels markedly improved. He is  moving great air no longer wheezing. Vital signs within normal limits. VBG showing no retention with normal pH. Troponin is negative. Patient has mild leukocytosis with white count of 13.2. He was started on Levaquin for COPD exacerbation. Chest x-ray with no acute findings. Patient be discharged home on Levaquin, prednisone, albuterol, close follow-up with primary care doctor. Smoking cessation counseling was provided to patient.  [CV]    Clinical Course User Index [CV] Rudene Re, MD    Pertinent labs & imaging results that were available during my care of the patient were reviewed by me and considered in my medical decision making (see chart for details).    ____________________________________________   FINAL CLINICAL IMPRESSION(S) / ED DIAGNOSES  Final diagnoses:  COPD exacerbation ([place])      NEW MEDICATIONS STARTED DURING THIS VISIT:  New Prescriptions   LEVOFLOXACIN (LEVAQUIN) 750 MG TABLET    Take 1 tablet (750 mg total) by mouth daily.   PREDNISONE (DELTASONE) 20 MG TABLET    Take 3 tablets (60 mg total) by mouth daily.     Note:  This document was prepared using Dragon voice recognition software and may include unintentional dictation errors.    Rudene Re, MD 08/06/16 [phone]

## 2016-08-06 NOTE — ED Notes (Signed)
NAD noted at time of D/C. Pt denies questions or concerns. Pt ambulatory to the lobby at this time.  

## 2016-08-06 NOTE — Discharge Instructions (Signed)

## 2016-08-06 NOTE — ED Triage Notes (Addendum)
Patient presents to the ED with increased shortness of breath and non-productive cough x 4 days.  Patient reports history of COPD.  Patient is speaking in full sentences without obvious difficulty at this time.

## 2016-08-18 ENCOUNTER — Emergency Department: Payer: No Typology Code available for payment source

## 2016-08-18 ENCOUNTER — Emergency Department
Admission: EM | Admit: 2016-08-18 | Discharge: 2016-08-18 | Disposition: A | Discharge status: Home / Self Care | Payer: No Typology Code available for payment source | Attending: Emergency Medicine | Admitting: Emergency Medicine

## 2016-08-18 ENCOUNTER — Encounter: Payer: Self-pay | Admitting: Emergency Medicine

## 2016-08-18 DIAGNOSIS — Y92410 Unspecified street and highway as the place of occurrence of the external cause: Secondary | ICD-10-CM | POA: Diagnosis not present

## 2016-08-18 DIAGNOSIS — S233XXA Sprain of ligaments of thoracic spine, initial encounter: Secondary | ICD-10-CM | POA: Insufficient documentation

## 2016-08-18 DIAGNOSIS — S299XXA Unspecified injury of thorax, initial encounter: Secondary | ICD-10-CM | POA: Diagnosis present

## 2016-08-18 DIAGNOSIS — S161XXA Strain of muscle, fascia and tendon at neck level, initial encounter: Secondary | ICD-10-CM | POA: Diagnosis not present

## 2016-08-18 DIAGNOSIS — M5030 Other cervical disc degeneration, unspecified cervical region: Secondary | ICD-10-CM | POA: Diagnosis not present

## 2016-08-18 DIAGNOSIS — J449 Chronic obstructive pulmonary disease, unspecified: Secondary | ICD-10-CM | POA: Insufficient documentation

## 2016-08-18 DIAGNOSIS — S239XXA Sprain of unspecified parts of thorax, initial encounter: Secondary | ICD-10-CM

## 2016-08-18 DIAGNOSIS — Y939 Activity, unspecified: Secondary | ICD-10-CM | POA: Insufficient documentation

## 2016-08-18 DIAGNOSIS — Y999 Unspecified external cause status: Secondary | ICD-10-CM | POA: Diagnosis not present

## 2016-08-18 DIAGNOSIS — I10 Essential (primary) hypertension: Secondary | ICD-10-CM | POA: Diagnosis not present

## 2016-08-18 DIAGNOSIS — Z79899 Other long term (current) drug therapy: Secondary | ICD-10-CM | POA: Insufficient documentation

## 2016-08-18 DIAGNOSIS — F17210 Nicotine dependence, cigarettes, uncomplicated: Secondary | ICD-10-CM | POA: Insufficient documentation

## 2016-08-18 MED ORDER — CYCLOBENZAPRINE HCL 5 MG PO TABS: 5.0000 mg | ORAL_TABLET | Freq: Three times a day (TID) | ORAL | 0 refills | Status: DC | PRN

## 2016-08-18 MED ORDER — CYCLOBENZAPRINE HCL 10 MG PO TABS
10.0000 mg | ORAL_TABLET | Freq: Once | ORAL | Status: AC
  Administered 2016-08-18: 10 mg via ORAL
  Filled 2016-08-18: qty 1

## 2016-08-18 MED ORDER — OXYCODONE-ACETAMINOPHEN 5-325 MG PO TABS: 1.0000 | ORAL_TABLET | ORAL | 0 refills | Status: DC | PRN

## 2016-08-18 MED ORDER — OXYCODONE-ACETAMINOPHEN 5-325 MG PO TABS
1.0000 | ORAL_TABLET | Freq: Once | ORAL | Status: AC
  Administered 2016-08-18: 1 via ORAL
  Filled 2016-08-18: qty 1

## 2016-08-18 NOTE — ED Notes (Signed)
All distal neurovascular intact.
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
## 2016-08-18 NOTE — Discharge Instructions (Signed)
Keep your appointment with your primary care doctor. Take medication only as directed and be aware that this medication could cause drowsiness increase your risk for falling.

## 2016-09-02 ENCOUNTER — Ambulatory Visit (INDEPENDENT_AMBULATORY_CARE_PROVIDER_SITE_OTHER): Payer: Medicaid Other | Admitting: Podiatry

## 2016-09-02 DIAGNOSIS — R202 Paresthesia of skin: Secondary | ICD-10-CM

## 2016-09-02 DIAGNOSIS — M779 Enthesopathy, unspecified: Secondary | ICD-10-CM

## 2016-09-02 DIAGNOSIS — G5791 Unspecified mononeuropathy of right lower limb: Secondary | ICD-10-CM

## 2016-09-02 DIAGNOSIS — M79671 Pain in right foot: Secondary | ICD-10-CM

## 2016-09-02 DIAGNOSIS — G5761 Lesion of plantar nerve, right lower limb: Secondary | ICD-10-CM

## 2016-09-02 DIAGNOSIS — M722 Plantar fascial fibromatosis: Secondary | ICD-10-CM | POA: Diagnosis not present

## 2016-09-02 DIAGNOSIS — M778 Other enthesopathies, not elsewhere classified: Secondary | ICD-10-CM

## 2016-09-03 MED ORDER — BETAMETHASONE SOD PHOS & ACET 6 (3-3) MG/ML IJ SUSP: 3.0000 mg | Freq: Once | INTRAMUSCULAR | Status: DC
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
## 2016-09-27 ENCOUNTER — Emergency Department: Payer: Medicaid Other

## 2016-09-27 ENCOUNTER — Emergency Department
Admission: EM | Admit: 2016-09-27 | Discharge: 2016-09-27 | Disposition: A | Discharge status: Home / Self Care | Payer: Medicaid Other | Attending: Emergency Medicine | Admitting: Emergency Medicine

## 2016-09-27 ENCOUNTER — Encounter: Payer: Self-pay | Admitting: *Deleted

## 2016-09-27 DIAGNOSIS — F17210 Nicotine dependence, cigarettes, uncomplicated: Secondary | ICD-10-CM | POA: Diagnosis not present

## 2016-09-27 DIAGNOSIS — I10 Essential (primary) hypertension: Secondary | ICD-10-CM | POA: Insufficient documentation

## 2016-09-27 DIAGNOSIS — J449 Chronic obstructive pulmonary disease, unspecified: Secondary | ICD-10-CM | POA: Insufficient documentation

## 2016-09-27 DIAGNOSIS — R079 Chest pain, unspecified: Secondary | ICD-10-CM | POA: Insufficient documentation

## 2016-09-27 DIAGNOSIS — Z7982 Long term (current) use of aspirin: Secondary | ICD-10-CM | POA: Diagnosis not present

## 2016-09-27 DIAGNOSIS — Z79899 Other long term (current) drug therapy: Secondary | ICD-10-CM | POA: Insufficient documentation

## 2016-09-27 LAB — BASIC METABOLIC PANEL
Anion gap: 6 (ref 5–15)
BUN: 19 mg/dL (ref 6–20)
CO2: 24 mmol/L (ref 22–32)
CREATININE: 0.96 mg/dL (ref 0.61–1.24)
Calcium: 9.3 mg/dL (ref 8.9–10.3)
Chloride: 105 mmol/L (ref 101–111)
Glucose, Bld: 120 mg/dL — ABNORMAL HIGH (ref 65–99)
Potassium: 4.3 mmol/L (ref 3.5–5.1)
SODIUM: 135 mmol/L (ref 135–145)

## 2016-09-27 LAB — CBC
HCT: 45.2 % (ref 40.0–52.0)
Hemoglobin: 15.2 g/dL (ref 13.0–18.0)
MCH: 28.1 pg (ref 26.0–34.0)
MCHC: 33.6 g/dL (ref 32.0–36.0)
MCV: 83.8 fL (ref 80.0–100.0)
PLATELETS: 240 10*3/uL (ref 150–440)
RBC: 5.39 MIL/uL (ref 4.40–5.90)
RDW: 14.8 % — AB (ref 11.5–14.5)
WBC: 23.7 10*3/uL — AB (ref 3.8–10.6)

## 2016-09-27 LAB — TROPONIN I

## 2016-09-27 MED ORDER — MORPHINE SULFATE (PF) 4 MG/ML IV SOLN
4.0000 mg | Freq: Once | INTRAVENOUS | Status: AC
  Administered 2016-09-27: 4 mg via INTRAVENOUS
  Filled 2016-09-27: qty 1

## 2016-09-27 MED ORDER — OXYCODONE-ACETAMINOPHEN 5-325 MG PO TABS
1.0000 | ORAL_TABLET | Freq: Once | ORAL | Status: AC
  Administered 2016-09-27: 1 via ORAL
  Filled 2016-09-27: qty 1

## 2016-09-27 MED ORDER — IOPAMIDOL (ISOVUE-370) INJECTION 76%
75.0000 mL | Freq: Once | INTRAVENOUS | Status: AC | PRN
  Administered 2016-09-27: 75 mL via INTRAVENOUS

## 2016-09-27 MED ORDER — OXYCODONE-ACETAMINOPHEN 5-325 MG PO TABS: 1.0000 | ORAL_TABLET | Freq: Four times a day (QID) | ORAL | 0 refills | Status: AC | PRN

## 2016-09-27 MED ORDER — ONDANSETRON HCL 4 MG/2ML IJ SOLN
4.0000 mg | Freq: Once | INTRAMUSCULAR | Status: AC
  Administered 2016-09-27: 4 mg via INTRAVENOUS
  Filled 2016-09-27: qty 2

## 2016-09-27 NOTE — Discharge Instructions (Signed)
Please see either the cardiologist I mentioned above or Dr. Rebecka Apley. Please call them tomorrow morning and try to get them to see her tomorrow. If you tell the office staff that you were seen in the emergency room for chest pain they should be able to see very quickly. Please return here if he get worse at all. This includes increasing shortness of breath chest heaviness fever etc.

## 2016-09-27 NOTE — ED Provider Notes (Signed)
[facility] Emergency Department Provider Note   ____________________________________________   First MD Initiated Contact with Patient 09/27/16 1642     (approximate)  I have reviewed the triage vital signs and the nursing notes.   HISTORY  Chief Complaint Chest Pain    HPI Mark Vang is a 56 y.o. male patient reports he feels like he has 200 pounds sitting on his shoulders. This began this morning. He reports he also has pain in the top part of his chest when he takes a deep breath. He is not particularly short of breath. He says his left arm feels like it is bruised. He has some degenerative joint disease in his neck he reports but he denies any weakness or numbness in his arms. At least nothing new since he had a stroke that affected the right arm. Patient has no chest heaviness the heaviness is on top of the shoulders. He does not have anything radiating to the back of the neck the pressure on his shoulders does not seem to change with exertion.   Past Medical History:  Diagnosis Date  . COPD (chronic obstructive pulmonary disease) ([place])   . Hypertension   . Stroke [facility])     Patient Active Problem List   Diagnosis Date Noted  . Arthritis 07/01/2016  . Seizure ([place]) 07/01/2016  . Sinusitis 07/01/2016  . Stroke ([place]) 07/01/2016  . COPD (chronic obstructive pulmonary disease) ([place]) 01/11/2013  . Dyspnea on exertion 01/11/2013  . GERD (gastroesophageal reflux disease) 01/11/2013  . HTN (hypertension) 01/11/2013  . Seizures ([place]) 01/11/2013  . Tobacco abuse 01/11/2013  . Chronic sinusitis 12/13/2012  . 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 KNEE SURGERY    . NASAL SINUS SURGERY      Prior to Admission medications   Medication Sig Start Date End Date Taking? Authorizing Provider  albuterol (PROVENTIL) (2.5 MG/3ML) 0.083% nebulizer solution Take 2.5 mg by  nebulization every 6 (six) hours as needed for wheezing or shortness of breath.    Historical Provider, MD  aspirin (ASPIRIN EC) 81 MG EC tablet Take 81 mg by mouth daily. Swallow whole.    Historical Provider, MD  atorvastatin (LIPITOR) 40 MG tablet Take 40 mg by mouth daily.    Historical Provider, MD  cyclobenzaprine (FLEXERIL) 5 MG tablet Take 1 tablet (5 mg total) by mouth 3 (three) times daily as needed for muscle spasms. 08/18/16   Johnn Hai, PA-C  fluticasone (FLONASE) 50 MCG/ACT nasal spray Place into both nostrils daily.    Historical Provider, MD  lisinopril (PRINIVIL,ZESTRIL) 20 MG tablet Take 20 mg by mouth daily.    Historical Provider, MD  loratadine (CLARITIN) 10 MG tablet Take 10 mg by mouth daily.    Historical Provider, MD  naproxen (NAPROSYN) 250 MG tablet Take 250 mg by mouth 2 (two) times daily with a meal.     Historical Provider, MD  NONFORMULARY OR COMPOUNDED ITEM Combination Pain Cream-[facility] 2 refills    Historical Provider, MD  Oxcarbazepine (TRILEPTAL) 300 MG tablet Take 300 mg by mouth 2 (two) times daily.     Historical Provider, MD  oxycodone (OXY-IR) 5 MG capsule Take 1 capsule (5 mg total) by mouth every 4 (four) hours as needed. 03/01/16   Arlyss Repress, PA-C  oxyCODONE-acetaminophen (PERCOCET) 5-325 MG tablet Take 1 tablet by mouth every 4 (four) hours as needed for severe pain. 08/18/16  Johnn Hai, PA-C  OXYGEN Inhale into the lungs.    Historical Provider, MD  pantoprazole (PROTONIX) 40 MG tablet Take 40 mg by mouth daily.    Historical Provider, MD  tiotropium (SPIRIVA) 18 MCG inhalation capsule Place 18 mcg into inhaler and inhale daily.    Historical Provider, MD    Allergies Amlodipine besy-benazepril hcl and Amlodipine  Family History  Problem Relation Age of Onset  . Cancer Mother   . Cancer Father     Social History Social History  Substance Use Topics  . Smoking status: Current Every Day Smoker    Packs/day: 1.00      Types: Cigarettes  . Smokeless tobacco: Never Used  . Alcohol use No    Review of Systems Constitutional: No fever/chills Eyes: No visual changes. ENT: No sore throat. Cardiovascular: Denies chest pain. Respiratory:shortness of breath. Gastrointestinal: No abdominal pain.  No nausea, no vomiting.  No diarrhea.  No constipation. Genitourinary: Negative for dysuria. Musculoskeletal: Negative for back pain. Skin: Negative for rash. Neurological: Negative for headaches, focal weakness or numbness.  Allergic/Immunilogical: **} 10-point ROS otherwise negative.  ____________________________________________   PHYSICAL EXAM:  VITAL SIGNS: ED Triage Vitals  Enc Vitals Group     BP 09/27/16 1700 118/79     Pulse Rate 09/27/16 1531 94     Resp 09/27/16 1531 20     Temp 09/27/16 1531 98.8 F (37.1 C)     Temp Source 09/27/16 1531 Oral     SpO2 09/27/16 1531 98 %     Weight 09/27/16 1532 264 lb (119.7 kg)     Height 09/27/16 1532 6' (1.829 m)     Head Circumference --      Peak Flow --      Pain Score 09/27/16 1533 10     Pain Loc --      Pain Edu? --      Excl. in [place]? --    Constitutional: Alert and oriented. Well appearing and in no acute distress. Eyes: Conjunctivae are normal. PERRL. EOMI. Head: Atraumatic. Nose: No congestion/rhinnorhea. Mouth/Throat: Mucous membranes are moist.  Oropharynx non-erythematous. Neck: No stridor.   Cardiovascular: Normal rate, regular rhythm. Grossly normal heart sounds.  Good peripheral circulation. Respiratory: Normal respiratory effort.  No retractions. Lungs CTAB. Gastrointestinal: Soft and nontender. No distention. No abdominal bruits. No CVA tenderness. Musculoskeletal: No lower extremity tenderness nor edema.  No joint effusions. Neurologic:  Normal speech and language. No gross focal neurologic deficits are appreciated.Take it early in the arms and hands No gait instability. Skin:  Skin is warm, dry and intact. No rash  noted. Psychiatric: Mood and affect are normal. Speech and behavior are normal.  ____________________________________________   LABS (all labs ordered are listed, but only abnormal results are displayed)  Labs Reviewed  BASIC METABOLIC PANEL - Abnormal; Notable for the following:       Result Value   Glucose, Bld 120 (*)    All other components within normal limits  CBC - Abnormal; Notable for the following:    WBC 23.7 (*)    RDW 14.8 (*)    All other components within normal limits  TROPONIN I  TROPONIN I   ____________________________________________  EKG  EKG read and interpreted by me shows sinus tachycardia rate 101 normal axis no acute ST-T wave changes there is at least one PVC and there are several areas of very poor baseline. ____________________________________________  RADIOLOGY  Study Result   CLINICAL DATA:  Chest pain  EXAM: CHEST  2 VIEW  COMPARISON:  08/06/2016  FINDINGS: Cardiomediastinal silhouette is stable. No infiltrate or pleural effusion. No pulmonary edema. Bony thorax is unremarkable.  IMPRESSION: No active cardiopulmonary disease.   Electronically Signed   By: Lahoma Crocker M.D.   On: 09/27/2016 15:55   Study Result   CLINICAL DATA:  Heaviness in both shoulders shortness of breath  EXAM: CT ANGIOGRAPHY CHEST WITH CONTRAST  TECHNIQUE: Multidetector CT imaging of the chest was performed using the standard protocol during bolus administration of intravenous contrast. Multiplanar CT image reconstructions and MIPs were obtained to evaluate the vascular anatomy.  CONTRAST:  75 mL Isovue 370 intravenous  COMPARISON:  Chest x-ray 09/27/2016, CT chest 05/20/2012  FINDINGS: Cardiovascular: Satisfactory opacification of the pulmonary arteries to the segmental level. No evidence of pulmonary embolism. Atherosclerotic calcifications within the aorta. No aneurysm or dissection. Heart size within normal limits. No  pericardial effusion.  Mediastinum/Nodes: Stable nonspecific mediastinal lymph nodes. No hilar adenopathy. No axillary adenopathy. Thyroid unremarkable. Trachea is midline. Esophagus within normal limits.  Lungs/Pleura: Mild emphysematous disease bilaterally. No acute consolidation or effusion. No pneumothorax.  Upper Abdomen: Fatty infiltration of the liver with sparing at the gallbladder fossa. No acute abnormalities.  Musculoskeletal: Degenerative changes of the thoracic spine, mild. No acute or suspicious bone lesions.  Review of the MIP images confirms the above findings.  IMPRESSION: 1. No CT evidence for acute pulmonary embolus. 2. Mild emphysematous disease within the lungs. No acute infiltrates. 3. Fatty infiltration of the liver.   Electronically Signed   By: Donavan Foil M.D.   On: 09/27/2016 19:49    Study Result   CLINICAL DATA:  56 year old male with shoulder heaviness, arm ache, cough and tachycardia since this morning. No reported injury.  EXAM: CT CERVICAL SPINE WITHOUT CONTRAST  TECHNIQUE: Multidetector CT imaging of the cervical spine was performed without intravenous contrast. Multiplanar CT image reconstructions were also generated.  COMPARISON:  08/18/2016 cervical spine radiographs.  FINDINGS: Alignment: Straightening of the cervical spine. There is minimal 2 mm retrolisthesis at C6-7, stable. Otherwise no subluxation. Dens is well positioned between the lateral masses of C1.  Skull base and vertebrae: No acute fracture. No primary bone lesion or focal pathologic process.  Soft tissues and spinal canal: No prevertebral fluid or swelling. No visible canal hematoma.  Disc levels: Moderate degenerative disc disease at C5-6 and C6-7, not appreciably change. Mild to moderate degenerative foraminal stenosis on the right at C5-6 predominantly due to uncovertebral hypertrophy. Moderate degenerative foraminal stenosis  bilaterally at C6-7 predominantly due to uncovertebral hypertrophy.  Upper chest: Centrilobular emphysema at the lung apices.  Other: Visualized mastoid air cells appear clear. No discrete thyroid nodules. No pathologically enlarged cervical nodes.  IMPRESSION: 1. No fracture or new malalignment in the cervical spine. 2. Moderate degenerative disc disease and degenerative foraminal stenosis in the lower cervical spine as described. 3. Minimal 2 mm retrolisthesis at C6-7, stable, probably degenerative. 4. Centrilobular emphysema at the lung apices.   Electronically Signed   By: Ilona Sorrel M.D.   On: 09/27/2016 19:50      ____________________________________________   PROCEDURES  Procedure(s) performed:   Procedures  Critical Care performed:  ____________________________________________   INITIAL IMPRESSION / ASSESSMENT AND PLAN / ED COURSE  Pertinent labs & imaging results that were available during my care of the patient were reviewed by me and considered in my medical decision making (see chart for details).    Clinical Course  ____________________________________________   FINAL CLINICAL IMPRESSION(S) / ED DIAGNOSES  Final diagnoses:  Nonspecific chest pain      NEW MEDICATIONS STARTED DURING THIS VISIT:  New Prescriptions   No medications on file     Note:  This document was prepared using Dragon voice recognition software and may include unintentional dictation errors.    Nena Polio, MD 09/27/16 2211

## 2016-09-27 NOTE — ED Triage Notes (Signed)
Pt ambulatory to triage.  Pt states it feels like heaviness on both shoulders.  No n/v/d. Reports sob.  Hx copd.  cig smoker.  Sx began this am.  Pt alert.

## 2016-09-30 ENCOUNTER — Ambulatory Visit: Payer: Medicaid Other | Admitting: Podiatry
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
## 2016-10-21 ENCOUNTER — Emergency Department: Payer: Medicaid Other

## 2016-10-21 ENCOUNTER — Emergency Department
Admission: EM | Admit: 2016-10-21 | Discharge: 2016-10-21 | Disposition: A | Discharge status: Home / Self Care | Payer: Medicaid Other | Attending: Emergency Medicine | Admitting: Emergency Medicine

## 2016-10-21 DIAGNOSIS — Z7982 Long term (current) use of aspirin: Secondary | ICD-10-CM | POA: Insufficient documentation

## 2016-10-21 DIAGNOSIS — R42 Dizziness and giddiness: Secondary | ICD-10-CM | POA: Insufficient documentation

## 2016-10-21 DIAGNOSIS — J069 Acute upper respiratory infection, unspecified: Secondary | ICD-10-CM

## 2016-10-21 DIAGNOSIS — J441 Chronic obstructive pulmonary disease with (acute) exacerbation: Secondary | ICD-10-CM | POA: Diagnosis not present

## 2016-10-21 DIAGNOSIS — I10 Essential (primary) hypertension: Secondary | ICD-10-CM | POA: Diagnosis not present

## 2016-10-21 DIAGNOSIS — Z8673 Personal history of transient ischemic attack (TIA), and cerebral infarction without residual deficits: Secondary | ICD-10-CM | POA: Diagnosis not present

## 2016-10-21 DIAGNOSIS — B9789 Other viral agents as the cause of diseases classified elsewhere: Secondary | ICD-10-CM

## 2016-10-21 DIAGNOSIS — F17210 Nicotine dependence, cigarettes, uncomplicated: Secondary | ICD-10-CM | POA: Insufficient documentation

## 2016-10-21 DIAGNOSIS — Z79899 Other long term (current) drug therapy: Secondary | ICD-10-CM | POA: Diagnosis not present

## 2016-10-21 DIAGNOSIS — R079 Chest pain, unspecified: Secondary | ICD-10-CM | POA: Diagnosis present

## 2016-10-21 LAB — INFLUENZA PANEL BY PCR (TYPE A & B)
INFLBPCR: NEGATIVE
Influenza A By PCR: NEGATIVE

## 2016-10-21 LAB — BASIC METABOLIC PANEL
Anion gap: 9 (ref 5–15)
BUN: 13 mg/dL (ref 6–20)
CALCIUM: 9.5 mg/dL (ref 8.9–10.3)
CO2: 26 mmol/L (ref 22–32)
CREATININE: 1.02 mg/dL (ref 0.61–1.24)
Chloride: 102 mmol/L (ref 101–111)
GFR calc non Af Amer: >60 mL/min (ref >60)
Glucose, Bld: 176 mg/dL — ABNORMAL HIGH (ref 65–99)
Potassium: 4.1 mmol/L (ref 3.5–5.1)
SODIUM: 137 mmol/L (ref 135–145)

## 2016-10-21 LAB — CBC
HCT: 45.9 % (ref 40.0–52.0)
Hemoglobin: 14.9 g/dL (ref 13.0–18.0)
MCH: 27.6 pg (ref 26.0–34.0)
MCHC: 32.5 g/dL (ref 32.0–36.0)
MCV: 85 fL (ref 80.0–100.0)
PLATELETS: 434 10*3/uL (ref 150–440)
RBC: 5.4 MIL/uL (ref 4.40–5.90)
RDW: 14.9 % — ABNORMAL HIGH (ref 11.5–14.5)
WBC: 18 10*3/uL — ABNORMAL HIGH (ref 3.8–10.6)

## 2016-10-21 LAB — TROPONIN I

## 2016-10-21 MED ORDER — ALBUTEROL SULFATE HFA 108 (90 BASE) MCG/ACT IN AERS: 2.0000 | INHALATION_SPRAY | Freq: Four times a day (QID) | RESPIRATORY_TRACT | 2 refills | Status: DC | PRN

## 2016-10-21 MED ORDER — PREDNISONE 20 MG PO TABS: 60.0000 mg | ORAL_TABLET | Freq: Every day | ORAL | 0 refills | Status: AC

## 2016-10-21 MED ORDER — DOXYCYCLINE HYCLATE 100 MG PO TABS
100.0000 mg | ORAL_TABLET | Freq: Once | ORAL | Status: AC
  Administered 2016-10-21: 100 mg via ORAL
  Filled 2016-10-21: qty 1

## 2016-10-21 MED ORDER — ACETAMINOPHEN 500 MG PO TABS
ORAL_TABLET | ORAL | Status: AC
  Filled 2016-10-21: qty 2

## 2016-10-21 MED ORDER — SODIUM CHLORIDE 0.9 % IV BOLUS (SEPSIS)
1000.0000 mL | Freq: Once | INTRAVENOUS | Status: AC
  Administered 2016-10-21: 1000 mL via INTRAVENOUS

## 2016-10-21 MED ORDER — IPRATROPIUM-ALBUTEROL 0.5-2.5 (3) MG/3ML IN SOLN
3.0000 mL | Freq: Once | RESPIRATORY_TRACT | Status: AC
  Administered 2016-10-21: 3 mL via RESPIRATORY_TRACT
  Filled 2016-10-21: qty 3

## 2016-10-21 MED ORDER — CYCLOBENZAPRINE HCL 10 MG PO TABS
ORAL_TABLET | ORAL | Status: AC
  Filled 2016-10-21: qty 1

## 2016-10-21 MED ORDER — ASPIRIN 81 MG PO CHEW
324.0000 mg | CHEWABLE_TABLET | Freq: Once | ORAL | Status: DC
Start: 2016-10-21 — End: 2016-10-21
  Filled 2016-10-21: qty 4

## 2016-10-21 MED ORDER — METHYLPREDNISOLONE SODIUM SUCC 125 MG IJ SOLR
125.0000 mg | Freq: Once | INTRAMUSCULAR | Status: AC
  Administered 2016-10-21: 125 mg via INTRAVENOUS
  Filled 2016-10-21: qty 2

## 2016-10-21 MED ORDER — CYCLOBENZAPRINE HCL 10 MG PO TABS
10.0000 mg | ORAL_TABLET | Freq: Once | ORAL | Status: AC
  Administered 2016-10-21: 10 mg via ORAL

## 2016-10-21 MED ORDER — ACETAMINOPHEN 500 MG PO TABS
1000.0000 mg | ORAL_TABLET | Freq: Once | ORAL | Status: AC
  Administered 2016-10-21: 1000 mg via ORAL

## 2016-10-21 MED ORDER — DOXYCYCLINE HYCLATE 100 MG PO CAPS: 100.0000 mg | ORAL_CAPSULE | Freq: Two times a day (BID) | ORAL | 0 refills | Status: AC

## 2016-10-21 NOTE — ED Triage Notes (Signed)
Pt arrives with reports of midsternal CP that radiates to his left chest and under his breast  -  He also reports dizziness and lightheadedness accompanies his pain  He reports taking 2- 324mg  aspirin prior to arrival

## 2016-10-21 NOTE — ED Notes (Signed)
Pt to Xray - ecg completed

## 2016-10-21 NOTE — ED Notes (Signed)
Report from Amber, RN

## 2016-10-21 NOTE — Discharge Instructions (Signed)

## 2016-10-21 NOTE — ED Provider Notes (Signed)
[facility] Emergency Department Provider Note  ____________________________________________  Time seen: Approximately 6:28 PM  I have reviewed the triage vital signs and the nursing notes.   HISTORY  Chief Complaint Chest Pain   HPI Mark Vang is a 56 y.o. male a history of COPD, active smoker, hypertension, CVA who presents for evaluation of chest pain. Patient reports that he has had intermittent cramping of the left side of his chest and left side of his abdomen that has been going on since 11AM this morning. Patient has a history of chronic muscle cramping and takes Flexeril 15mg  3 times a day. This episode earlier this morning is similar to all the other ones that he has on a daily basis. He reports that these episodes can happen on his chest, abdomen, back, arms. He reports that after he got out of the shower he was drying himself and felt dizzy like he was going to pass out. He reports that he has been feeling dizzy intermittently throughout the day todaywhich is similar to his prior strokes and that is why he came to the emergency room to make sure he was not having another stroke. He denies any slurred speech, difficulty swallowing, facial droop, unilateral numbness or weakness of his extremities. Patient is not on Plavix only aspirin. He continues to smoke. He also complaining of multiple episodes of vomiting today, nausea, cough productive of yellow sputum, and mildly worsening baseline shortness of breath and wheezing. He has not received his flu shot this year. Patient has no chest pain at this time. He just recently established care with Dr. Humphrey Rolls and has a stress test and ECHO scheduled in 3 days.  Past Medical History:  Diagnosis Date  . COPD (chronic obstructive pulmonary disease) ([place])   . Hypertension   . Stroke [facility])     Patient Active Problem List   Diagnosis Date Noted  . Arthritis 07/01/2016  . Seizure ([place]) 07/01/2016  . Sinusitis  07/01/2016  . Stroke ([place]) 07/01/2016  . COPD (chronic obstructive pulmonary disease) ([place]) 01/11/2013  . Dyspnea on exertion 01/11/2013  . GERD (gastroesophageal reflux disease) 01/11/2013  . HTN (hypertension) 01/11/2013  . Seizures ([place]) 01/11/2013  . Tobacco abuse 01/11/2013  . Chronic sinusitis 12/13/2012  . 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 KNEE SURGERY    . NASAL SINUS SURGERY      Prior to Admission medications   Medication Sig Start Date End Date Taking? Authorizing Provider  albuterol (PROVENTIL HFA;VENTOLIN HFA) 108 (90 Base) MCG/ACT inhaler Inhale 2 puffs into the lungs every 6 (six) hours as needed for wheezing or shortness of breath. 10/21/16   Rudene Re, MD  aspirin (ASPIRIN EC) 81 MG EC tablet Take 81 mg by mouth daily. Swallow whole.    Historical Provider, MD  atorvastatin (LIPITOR) 40 MG tablet Take 40 mg by mouth daily.    Historical Provider, MD  cyclobenzaprine (FLEXERIL) 5 MG tablet Take 1 tablet (5 mg total) by mouth 3 (three) times daily as needed for muscle spasms. 08/18/16   Johnn Hai, PA-C  doxycycline (VIBRAMYCIN) 100 MG capsule Take 1 capsule (100 mg total) by mouth 2 (two) times daily. 10/21/16 10/28/16  Rudene Re, MD  fluticasone (FLONASE) 50 MCG/ACT nasal spray Place into both nostrils daily.    Historical Provider, MD  lisinopril (PRINIVIL,ZESTRIL) 20 MG tablet Take 20 mg by mouth daily.    Historical  Provider, MD  loratadine (CLARITIN) 10 MG tablet Take 10 mg by mouth daily.    Historical Provider, MD  naproxen (NAPROSYN) 250 MG tablet Take 250 mg by mouth 2 (two) times daily with a meal.     Historical Provider, MD  NONFORMULARY OR COMPOUNDED ITEM Combination Pain Cream-[facility] 2 refills    Historical Provider, MD  Oxcarbazepine (TRILEPTAL) 300 MG tablet Take 300 mg by mouth 2 (two) times daily.     Historical Provider, MD  oxycodone  (OXY-IR) 5 MG capsule Take 1 capsule (5 mg total) by mouth every 4 (four) hours as needed. 03/01/16   Arlyss Repress, PA-C  oxyCODONE-acetaminophen (PERCOCET) 5-325 MG tablet Take 1 tablet by mouth every 4 (four) hours as needed for severe pain. 08/18/16   Johnn Hai, PA-C  oxyCODONE-acetaminophen (ROXICET) 5-325 MG tablet Take 1 tablet by mouth every 6 (six) hours as needed. 09/27/16 09/27/17  Nena Polio, MD  OXYGEN Inhale into the lungs.    Historical Provider, MD  pantoprazole (PROTONIX) 40 MG tablet Take 40 mg by mouth daily.    Historical Provider, MD  predniSONE (DELTASONE) 20 MG tablet Take 3 tablets (60 mg total) by mouth daily. 10/21/16 10/25/16  Rudene Re, MD  tiotropium (SPIRIVA) 18 MCG inhalation capsule Place 18 mcg into inhaler and inhale daily.    Historical Provider, MD    Allergies Amlodipine besy-benazepril hcl and Amlodipine  Family History  Problem Relation Age of Onset  . Cancer Mother   . Cancer Father     Social History Social History  Substance Use Topics  . Smoking status: Current Every Day Smoker    Packs/day: 1.00    Types: Cigarettes  . Smokeless tobacco: Never Used  . Alcohol use No    Review of Systems  Constitutional: Negative for fever. + Lightheadedness Eyes: Negative for visual changes. ENT: Negative for sore throat. Neck: No neck pain  Cardiovascular: + chest pain. Respiratory: + shortness of breath, wheezing, cough Gastrointestinal: Negative for abdominal pain, diarrhea. + vomiting Genitourinary: Negative for dysuria. Musculoskeletal: Negative for back pain. + muscle cramping Skin: Negative for rash. Neurological: Negative for headaches, weakness or numbness. Psych: No SI or HI  ____________________________________________   PHYSICAL EXAM:  VITAL SIGNS: ED Triage Vitals [10/21/16 1541]  Enc Vitals Group     BP 123/88     Pulse Rate (!) 103     Resp 20     Temp 98.5 F (36.9 C)     Temp Source Oral     SpO2 99 %      Weight 264 lb (119.7 kg)     Height 6' (1.829 m)     Head Circumference      Peak Flow      Pain Score 7     Pain Loc      Pain Edu?      Excl. in [place]?     Constitutional: Alert and oriented. Well appearing and in no apparent distress. HEENT:      Head: Normocephalic and atraumatic.         Eyes: Conjunctivae are normal. Sclera is non-icteric. EOMI. PERRL      Mouth/Throat: Mucous membranes are dry.       Neck: Supple with no signs of meningismus. Cardiovascular: Tachycardic with regular rhythm. No murmurs, gallops, or rubs. 2+ symmetrical distal pulses are present in all extremities. No JVD. Respiratory: Normal respiratory effort, normal sats, patient is moving good air with faint expiratory wheezes,  no crackles Gastrointestinal: Soft, non tender, and non distended with positive bowel sounds. No rebound or guarding. Musculoskeletal: Nontender with normal range of motion in all extremities. No edema, cyanosis, or erythema of extremities. Neurologic: Normal speech and language. Face is symmetric. Moving all extremities. No gross focal neurologic deficits are appreciated. Skin: Skin is warm, dry and intact. No rash noted. Psychiatric: Mood and affect are normal. Speech and behavior are normal.  ____________________________________________   LABS (all labs ordered are listed, but only abnormal results are displayed)  Labs Reviewed  BASIC METABOLIC PANEL - Abnormal; Notable for the following:       Result Value   Glucose, Bld 176 (*)    All other components within normal limits  CBC - Abnormal; Notable for the following:    WBC 18.0 (*)    RDW 14.9 (*)    All other components within normal limits  TROPONIN I  INFLUENZA PANEL BY PCR (TYPE A & B)  TROPONIN I   ____________________________________________  EKG  ED ECG REPORT I, Rudene Re, the attending physician, personally viewed and interpreted this ECG.  Sinus tachycardia with occasional PVCs, rate of 104, normal  intervals, normal axis, no ST elevations or depressions, nonspecific ST abnormalities. Unchanged from prior ____________________________________________  RADIOLOGY  CXR: Negative ____________________________________________   PROCEDURES  Procedure(s) performed: None Procedures Critical Care performed:  None ____________________________________________   INITIAL IMPRESSION / ASSESSMENT AND PLAN / ED COURSE  56 y.o. male a history of COPD, active smoker, hypertension, CVA who presents for evaluation of muscle cramps, cough, SOB, wheezing, lightheadedness and vomiting. Patient has wheezing, dry mucous membranes and mild tachycardia with remaining of his complaints concerning for a viral process such as Flu vs gastroenteritis. EKG with no ischemic changes. Will monitor patient on telemetry and cycle his cardiac enzymes. We'll give IV fluids, DuoNeb, Solu-Medrol, and doxycycline for COPD exacerbation. Chest x-ray with no evidence of pneumonia.  Clinical Course as of Oct 21 2038  Ludwig Clarks Oct 21, 2016  2019 Patient is improved after DuoNeb treatment. Chest x-ray with no assistance and pneumonia. Patient is receiving IV fluids. Second troponin is negative. Flu is negative. Patient feels markedly improved with duoneb and IVF. Plan to discharge home on a computer all, doxycycline, and prednisone for COPD exacerbation in the setting of a viral URI.  [CV]    Clinical Course User Index [CV] Rudene Re, MD    Pertinent labs & imaging results that were available during my care of the patient were reviewed by me and considered in my medical decision making (see chart for details).    ____________________________________________   FINAL CLINICAL IMPRESSION(S) / ED DIAGNOSES  Final diagnoses:  COPD exacerbation ([place])  Viral URI with cough      NEW MEDICATIONS STARTED DURING THIS VISIT:  New Prescriptions   ALBUTEROL (PROVENTIL HFA;VENTOLIN HFA) 108 (90 BASE) MCG/ACT INHALER    Inhale  2 puffs into the lungs every 6 (six) hours as needed for wheezing or shortness of breath.   DOXYCYCLINE (VIBRAMYCIN) 100 MG CAPSULE    Take 1 capsule (100 mg total) by mouth 2 (two) times daily.   PREDNISONE (DELTASONE) 20 MG TABLET    Take 3 tablets (60 mg total) by mouth daily.     Note:  This document was prepared using Dragon voice recognition software and may include unintentional dictation errors.    Rudene Re, MD 10/21/16 2040

## 2016-10-21 NOTE — ED Notes (Signed)
Pt alert and oriented X4, active, cooperative, pt in NAD. RR even and unlabored, color WNL.  Pt informed to return if any life threatening symptoms occur.   

## 2016-10-21 NOTE — ED Notes (Addendum)
Pt asking for pain medication due to "stomach cramping". MD informed and orders received.

## 2017-08-06 ENCOUNTER — Encounter: Payer: Self-pay | Admitting: Emergency Medicine

## 2017-08-06 ENCOUNTER — Emergency Department
Admission: EM | Admit: 2017-08-06 | Discharge: 2017-08-06 | Disposition: A | Discharge status: Home / Self Care | Payer: Medicaid Other | Attending: Emergency Medicine | Admitting: Emergency Medicine

## 2017-08-06 ENCOUNTER — Emergency Department: Payer: Medicaid Other

## 2017-08-06 ENCOUNTER — Other Ambulatory Visit: Payer: Self-pay

## 2017-08-06 DIAGNOSIS — Z79899 Other long term (current) drug therapy: Secondary | ICD-10-CM | POA: Diagnosis not present

## 2017-08-06 DIAGNOSIS — J441 Chronic obstructive pulmonary disease with (acute) exacerbation: Secondary | ICD-10-CM | POA: Insufficient documentation

## 2017-08-06 DIAGNOSIS — Z7982 Long term (current) use of aspirin: Secondary | ICD-10-CM | POA: Insufficient documentation

## 2017-08-06 DIAGNOSIS — J40 Bronchitis, not specified as acute or chronic: Secondary | ICD-10-CM

## 2017-08-06 DIAGNOSIS — Z8673 Personal history of transient ischemic attack (TIA), and cerebral infarction without residual deficits: Secondary | ICD-10-CM | POA: Insufficient documentation

## 2017-08-06 DIAGNOSIS — I10 Essential (primary) hypertension: Secondary | ICD-10-CM | POA: Diagnosis not present

## 2017-08-06 DIAGNOSIS — J209 Acute bronchitis, unspecified: Secondary | ICD-10-CM | POA: Diagnosis not present

## 2017-08-06 DIAGNOSIS — F17210 Nicotine dependence, cigarettes, uncomplicated: Secondary | ICD-10-CM | POA: Insufficient documentation

## 2017-08-06 DIAGNOSIS — R0602 Shortness of breath: Secondary | ICD-10-CM | POA: Diagnosis present

## 2017-08-06 LAB — CBC WITH DIFFERENTIAL/PLATELET
BASOS ABS: 0.2 10*3/uL — AB (ref 0–0.1)
BASOS PCT: 1 %
EOS PCT: 4 %
Eosinophils Absolute: 0.6 10*3/uL (ref 0–0.7)
HCT: 45.4 % (ref 40.0–52.0)
Hemoglobin: 14.9 g/dL (ref 13.0–18.0)
Lymphocytes Relative: 33 %
Lymphs Abs: 4.5 10*3/uL — ABNORMAL HIGH (ref 1.0–3.6)
MCH: 28 pg (ref 26.0–34.0)
MCHC: 32.7 g/dL (ref 32.0–36.0)
MCV: 85.7 fL (ref 80.0–100.0)
MONO ABS: 1.2 10*3/uL — AB (ref 0.2–1.0)
Monocytes Relative: 9 %
Neutro Abs: 7.1 10*3/uL — ABNORMAL HIGH (ref 1.4–6.5)
Neutrophils Relative %: 53 %
PLATELETS: 312 10*3/uL (ref 150–440)
RBC: 5.3 MIL/uL (ref 4.40–5.90)
RDW: 14.1 % (ref 11.5–14.5)
WBC: 13.6 10*3/uL — AB (ref 3.8–10.6)

## 2017-08-06 LAB — BASIC METABOLIC PANEL
ANION GAP: 10 (ref 5–15)
BUN: 14 mg/dL (ref 6–20)
CALCIUM: 9.3 mg/dL (ref 8.9–10.3)
CO2: 23 mmol/L (ref 22–32)
Chloride: 104 mmol/L (ref 101–111)
Creatinine, Ser: 0.85 mg/dL (ref 0.61–1.24)
GLUCOSE: 133 mg/dL — AB (ref 65–99)
Potassium: 3.5 mmol/L (ref 3.5–5.1)
SODIUM: 137 mmol/L (ref 135–145)

## 2017-08-06 MED ORDER — IPRATROPIUM-ALBUTEROL 0.5-2.5 (3) MG/3ML IN SOLN
RESPIRATORY_TRACT | Status: AC
  Administered 2017-08-06: 3 mL via RESPIRATORY_TRACT
  Filled 2017-08-06: qty 9

## 2017-08-06 MED ORDER — IPRATROPIUM-ALBUTEROL 0.5-2.5 (3) MG/3ML IN SOLN
3.0000 mL | Freq: Once | RESPIRATORY_TRACT | Status: AC
  Administered 2017-08-06: 3 mL via RESPIRATORY_TRACT

## 2017-08-06 MED ORDER — DOXYCYCLINE HYCLATE 100 MG PO TABS
100.0000 mg | ORAL_TABLET | Freq: Once | ORAL | Status: AC
Start: 2017-08-06 — End: 2017-08-06
  Administered 2017-08-06: 100 mg via ORAL
  Filled 2017-08-06: qty 1

## 2017-08-06 MED ORDER — PREDNISONE 20 MG PO TABS: 60.0000 mg | ORAL_TABLET | Freq: Every day | ORAL | 0 refills | Status: AC

## 2017-08-06 MED ORDER — PREDNISONE 20 MG PO TABS
60.0000 mg | ORAL_TABLET | Freq: Once | ORAL | Status: AC
Start: 2017-08-06 — End: 2017-08-06
  Administered 2017-08-06: 60 mg via ORAL

## 2017-08-06 MED ORDER — DOXYCYCLINE HYCLATE 100 MG PO CAPS: 100.0000 mg | ORAL_CAPSULE | Freq: Two times a day (BID) | ORAL | 0 refills | Status: AC

## 2017-08-06 MED ORDER — PREDNISONE 20 MG PO TABS
ORAL_TABLET | ORAL | Status: AC
  Filled 2017-08-06: qty 3

## 2017-08-06 MED ORDER — TIOTROPIUM BROMIDE MONOHYDRATE 18 MCG IN CAPS: 18.0000 ug | ORAL_CAPSULE | Freq: Every day | RESPIRATORY_TRACT | 1 refills | Status: DC

## 2017-08-06 MED ORDER — ALBUTEROL SULFATE HFA 108 (90 BASE) MCG/ACT IN AERS: 2.0000 | INHALATION_SPRAY | Freq: Four times a day (QID) | RESPIRATORY_TRACT | 2 refills | Status: DC | PRN

## 2017-08-06 NOTE — ED Notes (Signed)
ED Provider at bedside. 

## 2017-08-06 NOTE — ED Notes (Signed)
Pt reports blisters popping up on face, and hands.  No noticeable rash at this time. Pt states he has been short of breath for about a week. Pt gets short of breath after speaking in long sentences.  Pt states he is supposed to wear 2L [place] at night when sleeping, but pt states he has not been using it every night. Pt reports non-productive cough, states he has tried mucinex.

## 2017-08-06 NOTE — ED Triage Notes (Signed)
Pt to ED c/o shortness of breath for the past week. Pt states that he has been "choking" for the past week and is unable to get anything up. Pt has hx/o COPD. Pt also states that over the past week he has been waking up with different "bites" on his fingers and face. Pt states that he has "pockets of poison" in the palms of his hands where he has "pushed in poison out of his fingers". Pt states that he has changed the sheets on his bed but he still was waking up with bites.

## 2017-08-06 NOTE — Discharge Instructions (Signed)

## 2017-08-06 NOTE — ED Provider Notes (Signed)
[facility] Emergency Department Provider Note  ____________________________________________  Time seen: Approximately 2:33 PM  I have reviewed the triage vital signs and the nursing notes.   HISTORY  Chief Complaint Shortness of Breath   HPI Mark Vang is a 56 y.o. male with a history of smoking, COPD, hypertension, and stroke who presents for evaluation of shortness of breath. Patient reports a productive cough for a week however and able to bring up any phlegm. Progressively worsening shortness of breath and wheezing. He has been using his inhalers at home. Patient still smoking. He denies chest pain but does complain of mild chest tightness when the wheezing gets really bad. Reports one episode of subjective fever last night, no chills, no nausea, no vomiting, no diarrhea, no abdominal pain, no body aches or sore throat.  Past Medical History:  Diagnosis Date  . COPD (chronic obstructive pulmonary disease) ([place])   . Hypertension   . Stroke [facility])     Patient Active Problem List   Diagnosis Date Noted  . Arthritis 07/01/2016  . Seizure ([place]) 07/01/2016  . Sinusitis 07/01/2016  . Stroke ([place]) 07/01/2016  . COPD (chronic obstructive pulmonary disease) ([place]) 01/11/2013  . Dyspnea on exertion 01/11/2013  . GERD (gastroesophageal reflux disease) 01/11/2013  . HTN (hypertension) 01/11/2013  . Seizures ([place]) 01/11/2013  . Tobacco abuse 01/11/2013  . Chronic sinusitis 12/13/2012  . 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 KNEE SURGERY    . NASAL SINUS SURGERY      Prior to Admission medications   Medication Sig Start Date End Date Taking? Authorizing Provider  albuterol (PROVENTIL HFA;VENTOLIN HFA) 108 (90 Base) MCG/ACT inhaler Inhale 2 puffs into the lungs every 6 (six) hours as needed for wheezing or shortness of breath. 10/21/16   Alfred Levins, [place], MD  aspirin  (ASPIRIN EC) 81 MG EC tablet Take 81 mg by mouth daily. Swallow whole.    [provider]  atorvastatin (LIPITOR) 40 MG tablet Take 40 mg by mouth daily.    [provider]  cyclobenzaprine (FLEXERIL) 5 MG tablet Take 1 tablet (5 mg total) by mouth 3 (three) times daily as needed for muscle spasms. 08/18/16   Johnn Hai, PA-C  doxycycline (VIBRAMYCIN) 100 MG capsule Take 1 capsule (100 mg total) 2 (two) times daily for 7 days by mouth. 08/06/17 08/13/17  Alfred Levins, [place], MD  fluticasone [facility]) 50 MCG/ACT nasal spray Place into both nostrils daily.    [provider]  lisinopril (PRINIVIL,ZESTRIL) 20 MG tablet Take 20 mg by mouth daily.    [provider]  loratadine (CLARITIN) 10 MG tablet Take 10 mg by mouth daily.    [provider]  naproxen (NAPROSYN) 250 MG tablet Take 250 mg by mouth 2 (two) times daily with a meal.     [provider]  NONFORMULARY OR COMPOUNDED ITEM Combination Pain Cream-[facility] 2 refills    [provider]  Oxcarbazepine (TRILEPTAL) 300 MG tablet Take 300 mg by mouth 2 (two) times daily.     [provider]  oxycodone (OXY-IR) 5 MG capsule Take 1 capsule (5 mg total) by mouth every 4 (four) hours as needed. 03/01/16   Beers, Pierce Crane, PA-C  oxyCODONE-acetaminophen (PERCOCET) 5-325 MG tablet Take 1 tablet by mouth every 4 (four) hours as needed for severe pain. 08/18/16   Johnn Hai, PA-C  oxyCODONE-acetaminophen (ROXICET) 5-325 MG tablet Take  1 tablet by mouth every 6 (six) hours as needed. 09/27/16 09/27/17  Nena Polio, MD  OXYGEN Inhale into the lungs.    [provider]  pantoprazole (PROTONIX) 40 MG tablet Take 40 mg by mouth daily.    [provider]  predniSONE (DELTASONE) 20 MG tablet Take 3 tablets (60 mg total) daily for 4 days by mouth. 08/06/17 08/10/17  Alfred Levins, [place], MD  tiotropium (SPIRIVA) 18 MCG inhalation capsule Place 18 mcg into  inhaler and inhale daily.    [provider]    Allergies Amlodipine besy-benazepril hcl and Amlodipine  Family History  Problem Relation Age of Onset  . Cancer Mother   . Cancer Father     Social History Social History   Tobacco Use  . Smoking status: Current Every Day Smoker    Packs/day: 1.00    Types: Cigarettes  . Smokeless tobacco: Never Used  Substance Use Topics  . Alcohol use: No  . Drug use: No    Review of Systems  Constitutional: + fever. Eyes: Negative for visual changes. ENT: Negative for sore throat. Neck: No neck pain  Cardiovascular: + chest tightness. Respiratory: + shortness of breath. Gastrointestinal: Negative for abdominal pain, vomiting or diarrhea. Genitourinary: Negative for dysuria. Musculoskeletal: Negative for back pain. Skin: Negative for rash. Neurological: Negative for headaches, weakness or numbness. Psych: No SI or HI  ____________________________________________   PHYSICAL EXAM:  VITAL SIGNS: ED Triage Vitals  Enc Vitals Group     BP 08/06/17 1211 (!) 106/95     Pulse Rate 08/06/17 1211 (!) 103     Resp 08/06/17 1211 16     Temp 08/06/17 1211 98.6 F (37 C)     Temp Source 08/06/17 1211 Oral     SpO2 08/06/17 1211 98 %     Weight 08/06/17 1211 260 lb (117.9 kg)     Height 08/06/17 1211 6' (1.829 m)     Head Circumference --      Peak Flow --      Pain Score 08/06/17 1210 10     Pain Loc --      Pain Edu? --      Excl. in [place]? --     Constitutional: Alert and oriented. Well appearing and in no apparent distress. HEENT:      Head: Normocephalic and atraumatic.         Eyes: Conjunctivae are normal. Sclera is non-icteric.       Mouth/Throat: Mucous membranes are moist.       Neck: Supple with no signs of meningismus. Cardiovascular: Tachycardic with regular rhythm. No murmurs, gallops, or rubs. 2+ symmetrical distal pulses are present in all extremities. No JVD. Respiratory: Increased work of breathing,  audible wheezing, normal sats, decreased air movement bilaterally with diffuse expiratory wheezes throughout.  Gastrointestinal: Soft, non tender, and non distended with positive bowel sounds. No rebound or guarding. Musculoskeletal: Nontender with normal range of motion in all extremities. No edema, cyanosis, or erythema of extremities. Neurologic: Normal speech and language. Face is symmetric. Moving all extremities. No gross focal neurologic deficits are appreciated. Skin: Skin is warm, dry and intact. No rash noted. Psychiatric: Mood and affect are normal. Speech and behavior are normal.  ____________________________________________   LABS (all labs ordered are listed, but only abnormal results are displayed)  Labs Reviewed  CBC WITH DIFFERENTIAL/PLATELET - Abnormal; Notable for the following components:      Result Value   WBC 13.6 (*)  Neutro Abs 7.1 (*)    Lymphs Abs 4.5 (*)    Monocytes Absolute 1.2 (*)    Basophils Absolute 0.2 (*)    All other components within normal limits  BASIC METABOLIC PANEL - Abnormal; Notable for the following components:   Glucose, Bld 133 (*)    All other components within normal limits   ____________________________________________  EKG  ED ECG REPORT I, Rudene Re, the attending physician, personally viewed and interpreted this ECG.  Sinus tachycardia, rate of 100, normal intervals, normal axis, occasional PVCs, no ST elevations or depressions. Unchanged from prior from 09/2016 ____________________________________________  RADIOLOGY  CXR: Negative ____________________________________________   PROCEDURES  Procedure(s) performed: None Procedures Critical Care performed:  None ____________________________________________   INITIAL IMPRESSION / ASSESSMENT AND PLAN / ED COURSE  56 y.o. male with a history of smoking, COPD, hypertension, and stroke who presents for evaluation of productive cough, wheezing, subjective fever,  and shortness of breath. Patient is in mild respiratory distress with increased work of breathing, audible wheezing, decreased air movement with bilateral expiratory wheezes. Patient is afebrile here. Chest x-ray with no infiltrate. EKG with no ischemic changes. Labs show a leukocytosis with white count of 13.6. We'll give doing abs, start patient on prednisone and doxycycline for COPD exacerbation/bronchitis.    _________________________ 2:53 PM on 08/06/2017 -----------------------------------------  Patient received DuoNeb 3, doxycycline, and prednisone with full resolution of his shortness of breath, chest tightness, and wheezing. Patient is moving great air with no wheezing or crackles. Sats are normal. Patient be provided with refusal for his rescue inhaler and Spiriva. Patient is also going to be discharged home on prednisone and doxycycline for bronchitis. I recommended close follow-up with primary care doctor. Discussed return precautions with patient.   As part of my medical decision making, I reviewed the following data within the [place] notes reviewed and incorporated, Labs reviewed , EKG interpreted , Old EKG reviewed, Old chart reviewed, Radiograph reviewed , Notes from prior ED visits and [place] Controlled Substance Database    Pertinent labs & imaging results that were available during my care of the patient were reviewed by me and considered in my medical decision making (see chart for details).    ____________________________________________   FINAL CLINICAL IMPRESSION(S) / ED DIAGNOSES  Final diagnoses:  COPD exacerbation ([place])  Bronchitis      NEW MEDICATIONS STARTED DURING THIS VISIT:  This SmartLink is deprecated. Use AVSMEDLIST instead to display the medication list for a patient.   Note:  This document was prepared using Dragon voice recognition software and may include unintentional dictation errors.    Rudene Re,  MD 08/06/17 1455

## 2017-08-24 ENCOUNTER — Encounter: Payer: Self-pay | Admitting: Emergency Medicine

## 2017-08-24 ENCOUNTER — Emergency Department: Payer: Medicaid Other

## 2017-08-24 ENCOUNTER — Emergency Department
Admission: EM | Admit: 2017-08-24 | Discharge: 2017-08-24 | Disposition: A | Discharge status: Home / Self Care | Payer: Medicaid Other | Attending: Student in an Organized Health Care Education/Training Program | Admitting: Student in an Organized Health Care Education/Training Program

## 2017-08-24 DIAGNOSIS — J449 Chronic obstructive pulmonary disease, unspecified: Secondary | ICD-10-CM | POA: Diagnosis not present

## 2017-08-24 DIAGNOSIS — R1011 Right upper quadrant pain: Secondary | ICD-10-CM | POA: Diagnosis not present

## 2017-08-24 DIAGNOSIS — Z8673 Personal history of transient ischemic attack (TIA), and cerebral infarction without residual deficits: Secondary | ICD-10-CM | POA: Diagnosis not present

## 2017-08-24 DIAGNOSIS — R059 Cough, unspecified: Secondary | ICD-10-CM

## 2017-08-24 DIAGNOSIS — F17210 Nicotine dependence, cigarettes, uncomplicated: Secondary | ICD-10-CM | POA: Insufficient documentation

## 2017-08-24 DIAGNOSIS — I10 Essential (primary) hypertension: Secondary | ICD-10-CM | POA: Diagnosis not present

## 2017-08-24 DIAGNOSIS — R05 Cough: Secondary | ICD-10-CM | POA: Insufficient documentation

## 2017-08-24 DIAGNOSIS — J40 Bronchitis, not specified as acute or chronic: Secondary | ICD-10-CM | POA: Diagnosis not present

## 2017-08-24 DIAGNOSIS — R0789 Other chest pain: Secondary | ICD-10-CM

## 2017-08-24 DIAGNOSIS — Z79899 Other long term (current) drug therapy: Secondary | ICD-10-CM | POA: Diagnosis not present

## 2017-08-24 DIAGNOSIS — R079 Chest pain, unspecified: Secondary | ICD-10-CM | POA: Diagnosis present

## 2017-08-24 LAB — COMPREHENSIVE METABOLIC PANEL
ALBUMIN: 4.2 g/dL (ref 3.5–5.0)
ALT: 40 U/L (ref 17–63)
AST: 34 U/L (ref 15–41)
Alkaline Phosphatase: 102 U/L (ref 38–126)
Anion gap: 9 (ref 5–15)
BUN: 13 mg/dL (ref 6–20)
CHLORIDE: 102 mmol/L (ref 101–111)
CO2: 24 mmol/L (ref 22–32)
CREATININE: 0.93 mg/dL (ref 0.61–1.24)
Calcium: 9.1 mg/dL (ref 8.9–10.3)
GFR calc Af Amer: >60 mL/min (ref >60)
GFR calc non Af Amer: >60 mL/min (ref >60)
Glucose, Bld: 166 mg/dL — ABNORMAL HIGH (ref 65–99)
POTASSIUM: 3.9 mmol/L (ref 3.5–5.1)
SODIUM: 135 mmol/L (ref 135–145)
Total Bilirubin: 0.5 mg/dL (ref 0.3–1.2)
Total Protein: 7.5 g/dL (ref 6.5–8.1)

## 2017-08-24 LAB — URINALYSIS, COMPLETE (UACMP) WITH MICROSCOPIC
BACTERIA UA: NONE SEEN
Bilirubin Urine: NEGATIVE
GLUCOSE, UA: NEGATIVE mg/dL
Hgb urine dipstick: NEGATIVE
KETONES UR: NEGATIVE mg/dL
LEUKOCYTES UA: NEGATIVE
Nitrite: NEGATIVE
PROTEIN: NEGATIVE mg/dL
Specific Gravity, Urine: 1.005 (ref 1.005–1.030)
pH: 6 (ref 5.0–8.0)

## 2017-08-24 LAB — CBC
HEMATOCRIT: 46.7 % (ref 40.0–52.0)
Hemoglobin: 15.2 g/dL (ref 13.0–18.0)
MCH: 27.6 pg (ref 26.0–34.0)
MCHC: 32.6 g/dL (ref 32.0–36.0)
MCV: 84.6 fL (ref 80.0–100.0)
PLATELETS: 309 10*3/uL (ref 150–440)
RBC: 5.52 MIL/uL (ref 4.40–5.90)
RDW: 14.1 % (ref 11.5–14.5)
WBC: 13.2 10*3/uL — ABNORMAL HIGH (ref 3.8–10.6)

## 2017-08-24 LAB — LIPASE, BLOOD: LIPASE: 23 U/L (ref 11–51)

## 2017-08-24 LAB — FIBRIN DERIVATIVES D-DIMER (ARMC ONLY): Fibrin derivatives D-dimer (ARMC): 312.98 ng/mL (FEU) (ref 0.00–499.00)

## 2017-08-24 MED ORDER — PREDNISONE 20 MG PO TABS
60.0000 mg | ORAL_TABLET | Freq: Once | ORAL | Status: AC
  Administered 2017-08-24: 60 mg via ORAL
  Filled 2017-08-24: qty 3

## 2017-08-24 MED ORDER — IPRATROPIUM-ALBUTEROL 0.5-2.5 (3) MG/3ML IN SOLN
3.0000 mL | Freq: Once | RESPIRATORY_TRACT | Status: AC
Start: 2017-08-24 — End: 2017-08-24
  Administered 2017-08-24: 3 mL via RESPIRATORY_TRACT
  Filled 2017-08-24: qty 3

## 2017-08-24 MED ORDER — HYDROCODONE-ACETAMINOPHEN 5-325 MG PO TABS
1.0000 | ORAL_TABLET | Freq: Once | ORAL | Status: AC
  Administered 2017-08-24: 1 via ORAL
  Filled 2017-08-24: qty 1

## 2017-08-24 MED ORDER — LIDOCAINE HCL (PF) 4 % IJ SOLN
4.0000 mL | Freq: Once | INTRAMUSCULAR | Status: AC
  Administered 2017-08-24: 4 mL via RESPIRATORY_TRACT
  Filled 2017-08-24 (×2): qty 5

## 2017-08-24 MED ORDER — IPRATROPIUM-ALBUTEROL 0.5-2.5 (3) MG/3ML IN SOLN
3.0000 mL | Freq: Once | RESPIRATORY_TRACT | Status: AC
  Administered 2017-08-24: 3 mL via RESPIRATORY_TRACT
  Filled 2017-08-24: qty 3

## 2017-08-24 MED ORDER — OXYCODONE HCL 5 MG PO TABS: 5.0000 mg | ORAL_TABLET | Freq: Three times a day (TID) | ORAL | 0 refills | Status: DC | PRN

## 2017-08-24 MED ORDER — PREDNISONE 10 MG PO TABS: 10.0000 mg | ORAL_TABLET | Freq: Every day | ORAL | 0 refills | Status: DC

## 2017-08-24 NOTE — ED Provider Notes (Signed)
[facility] Emergency Department Provider Note    First MD Initiated Contact with Patient 08/24/17 1455     (approximate)  I have reviewed the triage vital signs and the nursing notes.   HISTORY  Chief Complaint Abdominal Pain    HPI Mark Vang is a 56 y.o. male history of stroke as well as COPD presents with right-sided chest pain and right sided upper quadrant abdominal pain that is gotten worse of the past 3days.  Patient states that he has been coughing very heavily.  States he tried taking Mucinex and was able to clear up some phlegm but since that has been having frequent and heavy coughing spells to the point where he is nearly having a syncopal episode.  Feels that the pain came about after 1 of these coughing spells like he "pulled something."  Denies any fevers.  No chest pain.  No nausea or vomiting.  Past Medical History:  Diagnosis Date  . COPD (chronic obstructive pulmonary disease) ([place])   . Hypertension   . Stroke [facility])    Family History  Problem Relation Age of Onset  . Cancer Mother   . Cancer Father    Past Surgical History:  Procedure Laterality Date  . KNEE SURGERY    . NASAL SINUS SURGERY     Patient Active Problem List   Diagnosis Date Noted  . Arthritis 07/01/2016  . Seizure ([place]) 07/01/2016  . Sinusitis 07/01/2016  . Stroke ([place]) 07/01/2016  . COPD (chronic obstructive pulmonary disease) ([place]) 01/11/2013  . Dyspnea on exertion 01/11/2013  . GERD (gastroesophageal reflux disease) 01/11/2013  . HTN (hypertension) 01/11/2013  . Seizures ([place]) 01/11/2013  . Tobacco abuse 01/11/2013  . Chronic sinusitis 12/13/2012  . 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 Authorizing Provider  aspirin (ASPIRIN EC) 81 MG EC tablet Take 325 mg by mouth daily. Swallow whole.    Yes [provider]  atorvastatin  (LIPITOR) 40 MG tablet Take 40 mg by mouth daily.   Yes [provider]  fluticasone (FLONASE) 50 MCG/ACT nasal spray Place into both nostrils daily.   Yes [provider]  lisinopril (PRINIVIL,ZESTRIL) 20 MG tablet Take 20 mg by mouth daily.   Yes [provider]  Oxcarbazepine (TRILEPTAL) 300 MG tablet Take 300 mg by mouth 2 (two) times daily.    Yes [provider]  pantoprazole (PROTONIX) 40 MG tablet Take 40 mg by mouth daily.   Yes [provider]  albuterol (PROVENTIL HFA;VENTOLIN HFA) 108 (90 Base) MCG/ACT inhaler Inhale 2 puffs every 6 (six) hours as needed into the lungs for wheezing or shortness of breath. 08/06/17   Rudene Re, MD  cyclobenzaprine (FLEXERIL) 5 MG tablet Take 1 tablet (5 mg total) by mouth 3 (three) times daily as needed for muscle spasms. 08/18/16   Johnn Hai, PA-C  oxycodone (OXY-IR) 5 MG capsule Take 1 capsule (5 mg total) by mouth every 4 (four) hours as needed. Patient not taking: Reported on 08/24/2017 03/01/16   Arlyss Repress, PA-C  oxyCODONE (ROXICODONE) 5 MG immediate release tablet Take 1 tablet (5 mg total) by mouth every 8 (eight) hours as needed. 08/24/17 08/24/18  Merlyn Lot, MD  oxyCODONE-acetaminophen (PERCOCET) 5-325 MG tablet Take 1 tablet by mouth every 4 (four) hours as needed for severe pain. Patient not taking: Reported on 08/24/2017 08/18/16   Madalyn Rob,  Rhonda L, PA-C  oxyCODONE-acetaminophen (ROXICET) 5-325 MG tablet Take 1 tablet by mouth every 6 (six) hours as needed. Patient not taking: Reported on 08/24/2017 09/27/16 09/27/17  Nena Polio, MD  OXYGEN Inhale into the lungs.    [provider]  predniSONE (DELTASONE) 10 MG tablet Take 1 tablet (10 mg total) by mouth daily. Day 1-2: Take 50 mg  ( 5 pills) Day 3-4 : Take 40 mg (4pills) Day 5-6: Take 30 mg (3 pills) Day 7-8:  Take 20 mg (2 pills) Day 9:  Take 10mg  (1 pill) 08/24/17   Merlyn Lot, MD  tiotropium  (SPIRIVA) 18 MCG inhalation capsule Place 1 capsule (18 mcg total) daily into inhaler and inhale. Patient not taking: Reported on 08/24/2017 08/06/17   Rudene Re, MD    Allergies Amlodipine besy-benazepril hcl and Amlodipine    Social History Social History   Tobacco Use  . Smoking status: Current Every Day Smoker    Packs/day: 1.00    Types: Cigarettes  . Smokeless tobacco: Never Used  Substance Use Topics  . Alcohol use: No  . Drug use: No    Review of Systems Patient denies headaches, rhinorrhea, blurry vision, numbness, shortness of breath, chest pain, edema, cough, abdominal pain, nausea, vomiting, diarrhea, dysuria, fevers, rashes or hallucinations unless otherwise stated above in HPI. ____________________________________________   PHYSICAL EXAM:  VITAL SIGNS: Vitals:   08/24/17 1600 08/24/17 1630  BP: 123/89 109/73  Pulse: 88 87  Resp: (!) 21 (!) 24  Temp:    SpO2: 99% 93%    Constitutional: Alert and oriented. Well appearing and in no acute distress. Eyes: Conjunctivae are normal.  Head: Atraumatic. Nose: No congestion/rhinnorhea. Mouth/Throat: Mucous membranes are moist.   Neck: No stridor. Painless ROM.  Cardiovascular: Normal rate, regular rhythm. Grossly normal heart sounds.  Good peripheral circulation. Respiratory: Normal respiratory effort.  There is scattered wheeze throughout.  There is reproducible chest wall tenderness on the right lower hemithorax.  No crepitus or ecchymosis noted. Gastrointestinal: Soft and nontender in all four quadrants No distention. No abdominal bruits. No CVA tenderness. Musculoskeletal: No lower extremity tenderness nor edema.  No joint effusions. Neurologic:  Normal speech and language. No gross focal neurologic deficits are appreciated. No facial droop Skin:  Skin is warm, dry and intact. No rash noted. Psychiatric: Mood and affect are normal. Speech and behavior are  normal.  ____________________________________________   LABS (all labs ordered are listed, but only abnormal results are displayed)  Results for orders placed or performed during the hospital encounter of 08/24/17 (from the past 24 hour(s))  Lipase, blood     Status: None   Collection Time: 08/24/17 12:23 PM  Result Value Ref Range   Lipase 23 11 - 51 U/L  Comprehensive metabolic panel     Status: Abnormal   Collection Time: 08/24/17 12:23 PM  Result Value Ref Range   Sodium 135 135 - 145 mmol/L   Potassium 3.9 3.5 - 5.1 mmol/L   Chloride 102 101 - 111 mmol/L   CO2 24 22 - 32 mmol/L   Glucose, Bld 166 (H) 65 - 99 mg/dL   BUN 13 6 - 20 mg/dL   Creatinine, Ser 0.93 0.61 - 1.24 mg/dL   Calcium 9.1 8.9 - 10.3 mg/dL   Total Protein 7.5 6.5 - 8.1 g/dL   Albumin 4.2 3.5 - 5.0 g/dL   AST 34 15 - 41 U/L   ALT 40 17 - 63 U/L   Alkaline Phosphatase 102  38 - 126 U/L   Total Bilirubin 0.5 0.3 - 1.2 mg/dL   GFR calc non Af Amer >60 >60 mL/min   GFR calc Af Amer >60 >60 mL/min   Anion gap 9 5 - 15  CBC     Status: Abnormal   Collection Time: 08/24/17 12:23 PM  Result Value Ref Range   WBC 13.2 (H) 3.8 - 10.6 K/uL   RBC 5.52 4.40 - 5.90 MIL/uL   Hemoglobin 15.2 13.0 - 18.0 g/dL   HCT 46.7 40.0 - 52.0 %   MCV 84.6 80.0 - 100.0 fL   MCH 27.6 26.0 - 34.0 pg   MCHC 32.6 32.0 - 36.0 g/dL   RDW 14.1 11.5 - 14.5 %   Platelets 309 150 - 440 K/uL  Urinalysis, Complete w Microscopic     Status: Abnormal   Collection Time: 08/24/17 12:23 PM  Result Value Ref Range   Color, Urine YELLOW (A) YELLOW   APPearance CLEAR (A) CLEAR   Specific Gravity, Urine 1.005 1.005 - 1.030   pH 6.0 5.0 - 8.0   Glucose, UA NEGATIVE NEGATIVE mg/dL   Hgb urine dipstick NEGATIVE NEGATIVE   Bilirubin Urine NEGATIVE NEGATIVE   Ketones, ur NEGATIVE NEGATIVE mg/dL   Protein, ur NEGATIVE NEGATIVE mg/dL   Nitrite NEGATIVE NEGATIVE   Leukocytes, UA NEGATIVE NEGATIVE   RBC / HPF 0-5 0 - 5 RBC/hpf   WBC, UA 0-5 0 - 5  WBC/hpf   Bacteria, UA NONE SEEN NONE SEEN   Squamous Epithelial / LPF 0-5 (A) NONE SEEN  Fibrin derivatives D-Dimer ([facility] only)     Status: None   Collection Time: 08/24/17  3:38 PM  Result Value Ref Range   Fibrin derivatives D-dimer ([facility]) 312.98 0.00 - 499.00 ng/mL (FEU)   ____________________________________________  EKG My review and personal interpretation at Time: 15:36   Indication: cough  Rate: 80  Rhythm: sinus Axis: normal Other: occasional pvc, normal intervals ____________________________________________  RADIOLOGY  I personally reviewed all radiographic images ordered to evaluate for the above acute complaints and reviewed radiology reports and findings.  These findings were personally discussed with the patient.  Please see medical record for radiology report.  ____________________________________________   PROCEDURES  Procedure(s) performed:  Procedures    Critical Care performed: no ____________________________________________   INITIAL IMPRESSION / ASSESSMENT AND PLAN / ED COURSE  Pertinent labs & imaging results that were available during my care of the patient were reviewed by me and considered in my medical decision making (see chart for details).  DDX:  ACS, pericarditis, esophagitis, boerhaaves, pe, dissection, pna, bronchitis, costochondritis    Miller Limehouse is a 56 y.o. who presents to the ED with cough and right lower chest wall pain as described above.  Radiographs ordered to evaluate for effusion fracture or pneumothorax are reassuring.  His abdominal exam is soft and benign.  This seems to be more musculoskeletal pain related to his coughing fits.  His lung exam is concerning for bronchitis therefore will give nebulizer treatments as well as an additional dose of steroids.  Does have mild leukocytosis at this time this does not seem clinically consistent with cholecystitis, appendicitis or other infectious process.  Patient was on his  previous doses of steroids which may reflect this mild leukocytosis.  Patient is low risk by Wells criteria, will send d-dimer to further risk stratify for pulmonary embolism while treating the patient's symptoms.  Clinical Course as of Aug 24 1749  Thu Aug 24, 2017  1623 D-dimer is negative.  Patient without any hypoxia.  Still having a had a cough therefore will give a lidocaine nebulizer and reassess.  [PR]  1642 Patient reassessed.  Breathing has significantly improved.  Still waiting on lidocaine nebulizer.  Repeat abdominal exam is soft and benign.  This not clinically consistent with intra-abdominal process.  [PR]  6004 Patient able to ambulate with a steady gait.  No hypoxia.  Patient feels much improved.  I do feel the patient is clinically stable for discharge with outpatient follow-up.  Discussed signs and symptoms for which he should return to the ER.  [PR]    Clinical Course User Index [PR] Merlyn Lot, MD     ____________________________________________   FINAL CLINICAL IMPRESSION(S) / ED DIAGNOSES  Final diagnoses:  Bronchitis  Acute chest wall pain  Cough      NEW MEDICATIONS STARTED DURING THIS VISIT:  This SmartLink is deprecated. Use AVSMEDLIST instead to display the medication list for a patient.   Note:  This document was prepared using Dragon voice recognition software and may include unintentional dictation errors.    Merlyn Lot, MD 08/24/17 1750

## 2017-08-24 NOTE — ED Notes (Addendum)
Pt reports firmness of abdomen has gotten worse x 3-4 hours. Bowel sounds hypoactive on LUQ and LLQ. Pt reports coughing x3 days and thinks he hurt himself coughing under his right rib cage. Pt tender to touch in RUQ. Denies N/V. Pt states he has experienced some dysuria today only "right before [he] left the house." Hx COPD. Denies any abdominal history.

## 2017-08-24 NOTE — ED Triage Notes (Signed)
Patient presents to ED via POV from home with c/o right sided abdominal pain and cough x3 days. Patient ambulatory to triage. Patient denies N/V/D.

## 2017-08-24 NOTE — ED Notes (Signed)
D\Duonebs to be completed upon patient's return to room from XR. Transported to XR at this time.

## 2017-08-24 NOTE — ED Notes (Signed)
ED Provider at bedside. 

## 2017-08-24 NOTE — ED Notes (Signed)
Pt ambulatory upon discharge. Verbalized understanding of discharge instructions, prescriptions and follow-up care. VSS. A&O x4. Skin warm and dry.

## 2017-09-03 ENCOUNTER — Encounter: Payer: Self-pay | Admitting: Emergency Medicine

## 2017-09-03 ENCOUNTER — Emergency Department
Admission: EM | Admit: 2017-09-03 | Discharge: 2017-09-03 | Disposition: A | Discharge status: Home / Self Care | Payer: Medicaid Other | Attending: Emergency Medicine | Admitting: Emergency Medicine

## 2017-09-03 ENCOUNTER — Other Ambulatory Visit: Payer: Self-pay

## 2017-09-03 DIAGNOSIS — R252 Cramp and spasm: Secondary | ICD-10-CM | POA: Diagnosis present

## 2017-09-03 DIAGNOSIS — I10 Essential (primary) hypertension: Secondary | ICD-10-CM | POA: Diagnosis not present

## 2017-09-03 DIAGNOSIS — Z7982 Long term (current) use of aspirin: Secondary | ICD-10-CM | POA: Insufficient documentation

## 2017-09-03 DIAGNOSIS — Z79899 Other long term (current) drug therapy: Secondary | ICD-10-CM | POA: Diagnosis not present

## 2017-09-03 DIAGNOSIS — J449 Chronic obstructive pulmonary disease, unspecified: Secondary | ICD-10-CM | POA: Insufficient documentation

## 2017-09-03 DIAGNOSIS — F17210 Nicotine dependence, cigarettes, uncomplicated: Secondary | ICD-10-CM | POA: Insufficient documentation

## 2017-09-03 DIAGNOSIS — E860 Dehydration: Secondary | ICD-10-CM | POA: Diagnosis not present

## 2017-09-03 LAB — COMPREHENSIVE METABOLIC PANEL
ALK PHOS: 122 U/L (ref 38–126)
ALT: 46 U/L (ref 17–63)
AST: 31 U/L (ref 15–41)
Albumin: 4.3 g/dL (ref 3.5–5.0)
Anion gap: 11 (ref 5–15)
BUN: 22 mg/dL — AB (ref 6–20)
CALCIUM: 9.8 mg/dL (ref 8.9–10.3)
CHLORIDE: 104 mmol/L (ref 101–111)
CO2: 20 mmol/L — AB (ref 22–32)
CREATININE: 1.18 mg/dL (ref 0.61–1.24)
Glucose, Bld: 162 mg/dL — ABNORMAL HIGH (ref 65–99)
Potassium: 5 mmol/L (ref 3.5–5.1)
SODIUM: 135 mmol/L (ref 135–145)
Total Bilirubin: 0.8 mg/dL (ref 0.3–1.2)
Total Protein: 8.3 g/dL — ABNORMAL HIGH (ref 6.5–8.1)

## 2017-09-03 LAB — CBC WITH DIFFERENTIAL/PLATELET
BASOS ABS: 0.2 10*3/uL — AB (ref 0–0.1)
Basophils Relative: 1 %
Eosinophils Absolute: 0.5 10*3/uL (ref 0–0.7)
Eosinophils Relative: 2 %
HCT: 50.6 % (ref 40.0–52.0)
HEMOGLOBIN: 16.4 g/dL (ref 13.0–18.0)
LYMPHS ABS: 5.3 10*3/uL — AB (ref 1.0–3.6)
LYMPHS PCT: 22 %
MCH: 27.5 pg (ref 26.0–34.0)
MCHC: 32.4 g/dL (ref 32.0–36.0)
MCV: 85 fL (ref 80.0–100.0)
Monocytes Absolute: 1.9 10*3/uL — ABNORMAL HIGH (ref 0.2–1.0)
Monocytes Relative: 8 %
NEUTROS PCT: 67 %
Neutro Abs: 16.5 10*3/uL — ABNORMAL HIGH (ref 1.4–6.5)
PLATELETS: 372 10*3/uL (ref 150–440)
RBC: 5.95 MIL/uL — AB (ref 4.40–5.90)
RDW: 14.9 % — ABNORMAL HIGH (ref 11.5–14.5)
WBC: 24.4 10*3/uL — AB (ref 3.8–10.6)

## 2017-09-03 MED ORDER — LORAZEPAM 2 MG/ML IJ SOLN
INTRAMUSCULAR | Status: AC
  Administered 2017-09-03: 2 mg via INTRAVENOUS
  Filled 2017-09-03: qty 1

## 2017-09-03 MED ORDER — SODIUM CHLORIDE 0.9 % IV BOLUS (SEPSIS)
1000.0000 mL | Freq: Once | INTRAVENOUS | Status: AC
  Administered 2017-09-03: 1000 mL via INTRAVENOUS

## 2017-09-03 MED ORDER — HALOPERIDOL LACTATE 5 MG/ML IJ SOLN
5.0000 mg | Freq: Once | INTRAMUSCULAR | Status: AC
  Administered 2017-09-03: 5 mg via INTRAVENOUS
  Filled 2017-09-03: qty 1

## 2017-09-03 MED ORDER — SODIUM CHLORIDE 0.9 % IV BOLUS (SEPSIS): 1000.0000 mL | Freq: Once | INTRAVENOUS | Status: DC

## 2017-09-03 MED ORDER — HALOPERIDOL LACTATE 5 MG/ML IJ SOLN: 5.0000 mg | Freq: Once | INTRAMUSCULAR | Status: DC

## 2017-09-03 MED ORDER — LORAZEPAM 2 MG/ML IJ SOLN
2.0000 mg | Freq: Once | INTRAMUSCULAR | Status: AC
  Administered 2017-09-03: 2 mg via INTRAVENOUS

## 2017-09-03 NOTE — ED Provider Notes (Signed)
[facility] Emergency Department Provider Note  ____________________________________________   First MD Initiated Contact with Patient 09/03/17 1214     (approximate)  I have reviewed the triage vital signs and the nursing notes.   HISTORY  Chief Complaint Generalized Body Aches   HPI Mark Vang is a 56 y.o. male who comes to the emergency department via EMS for 3 weeks of daily severe diffuse full body cramps.  He was initially seen in our emergency department roughly 3 weeks ago and was treated for bronchitis as well as given a total of 8 tablets of oxycodone instant release for his pain.  He says the oxycodone relieves his symptoms.  He ran out of oxycodone and called his primary care physician who prescribed baclofen which has not helped the patient's symptoms.  He comes to the emergency department today because his pain has worsened.  It is difficult for him to quantify what the pain feels like aside from a severe cramp.  It occurs in his abdomen and his arms his legs his neck and his back.  Seems to be somewhat worsened with walking and somewhat improved with opioids.  He was given 50 mcg of fentanyl in route which did not help his symptoms.  He denies chest pain or shortness of breath.  He denies fevers or chills.  Past Medical History:  Diagnosis Date  . COPD (chronic obstructive pulmonary disease) ([place])   . Hypertension   . Stroke [facility])     Patient Active Problem List   Diagnosis Date Noted  . Arthritis 07/01/2016  . Seizure ([place]) 07/01/2016  . Sinusitis 07/01/2016  . Stroke ([place]) 07/01/2016  . COPD (chronic obstructive pulmonary disease) ([place]) 01/11/2013  . Dyspnea on exertion 01/11/2013  . GERD (gastroesophageal reflux disease) 01/11/2013  . HTN (hypertension) 01/11/2013  . Seizures ([place]) 01/11/2013  . Tobacco abuse 01/11/2013  . Chronic sinusitis 12/13/2012  . 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 KNEE SURGERY    . NASAL SINUS SURGERY      Prior to Admission medications   Medication Sig Start Date End Date Taking? Authorizing Provider  albuterol (PROVENTIL HFA;VENTOLIN HFA) 108 (90 Base) MCG/ACT inhaler Inhale 2 puffs every 6 (six) hours as needed into the lungs for wheezing or shortness of breath. 08/06/17   Rudene Re, MD  aspirin (ASPIRIN EC) 81 MG EC tablet Take 325 mg by mouth daily. Swallow whole.     [provider]  atorvastatin (LIPITOR) 40 MG tablet Take 40 mg by mouth daily.    [provider]  cyclobenzaprine (FLEXERIL) 5 MG tablet Take 1 tablet (5 mg total) by mouth 3 (three) times daily as needed for muscle spasms. 08/18/16   Johnn Hai, PA-C  fluticasone (FLONASE) 50 MCG/ACT nasal spray Place into both nostrils daily.    [provider]  lisinopril (PRINIVIL,ZESTRIL) 20 MG tablet Take 20 mg by mouth daily.    [provider]  Oxcarbazepine (TRILEPTAL) 300 MG tablet Take 300 mg by mouth 2 (two) times daily.     [provider]  oxycodone (OXY-IR) 5 MG capsule Take 1 capsule (5 mg total) by mouth every 4 (four) hours as needed. Patient not taking: Reported on 08/24/2017 03/01/16   Arlyss Repress, PA-C  oxyCODONE (ROXICODONE) 5 MG immediate release tablet Take 1 tablet (5 mg total) by mouth every 8 (eight) hours as needed. 08/24/17 08/24/18  Merlyn Lot, MD  oxyCODONE-acetaminophen (PERCOCET) 5-325 MG tablet Take 1 tablet by mouth every 4 (four) hours as needed for severe pain. Patient not taking: Reported on 08/24/2017 08/18/16   Johnn Hai, PA-C  oxyCODONE-acetaminophen (ROXICET) 5-325 MG tablet Take 1 tablet by mouth every 6 (six) hours as needed. Patient not taking: Reported on 08/24/2017 09/27/16 09/27/17  Nena Polio, MD  OXYGEN Inhale into the lungs.    [provider]  pantoprazole (PROTONIX) 40 MG tablet Take 40 mg by mouth daily.     [provider]  predniSONE (DELTASONE) 10 MG tablet Take 1 tablet (10 mg total) by mouth daily. Day 1-2: Take 50 mg  ( 5 pills) Day 3-4 : Take 40 mg (4pills) Day 5-6: Take 30 mg (3 pills) Day 7-8:  Take 20 mg (2 pills) Day 9:  Take 10mg  (1 pill) 08/24/17   Merlyn Lot, MD  tiotropium (SPIRIVA) 18 MCG inhalation capsule Place 1 capsule (18 mcg total) daily into inhaler and inhale. Patient not taking: Reported on 08/24/2017 08/06/17   Rudene Re, MD    Allergies Amlodipine besy-benazepril hcl and Amlodipine  Family History  Problem Relation Age of Onset  . Cancer Mother   . Cancer Father     Social History Social History   Tobacco Use  . Smoking status: Current Every Day Smoker    Packs/day: 1.00    Types: Cigarettes  . Smokeless tobacco: Never Used  Substance Use Topics  . Alcohol use: No  . Drug use: No    Review of Systems Constitutional: No fever/chills Eyes: No visual changes. ENT: No sore throat. Cardiovascular: Denies chest pain. Respiratory: Denies shortness of breath. Gastrointestinal: No abdominal pain.  No nausea, no vomiting.  No diarrhea.  No constipation. Genitourinary: Negative for dysuria. Musculoskeletal: Positive for back pain. Skin: Negative for rash. Neurological: Negative for headaches, focal weakness or numbness.   ____________________________________________   PHYSICAL EXAM:  VITAL SIGNS: ED Triage Vitals  Enc Vitals Group     BP      Pulse      Resp      Temp      Temp src      SpO2      Weight      Height      Head Circumference      Peak Flow      Pain Score      Pain Loc      Pain Edu?      Excl. in [place]?     Constitutional: Alert and oriented x4 hyperventilating and anxious appearing Eyes: PERRL EOMI. Head: Atraumatic. Nose: No congestion/rhinnorhea. Mouth/Throat: No trismus Neck: No stridor.   Cardiovascular: Tachycardic rate, regular rhythm. Grossly normal heart sounds.  Good peripheral  circulation. Respiratory: Taking rapid shallow breaths hyperventilating no retractions. Lungs CTAB and moving good air Gastrointestinal: Obese abdomen soft nontender no rebound or guarding no peritonitis Musculoskeletal: No lower extremity edema   No midline back tenderness Neurologic:  Normal speech and language. No gross focal neurologic deficits are appreciated. Skin:  Skin is warm, dry and intact. No rash noted. Psychiatric: Appears quite anxious    ____________________________________________   DIFFERENTIAL includes but not limited to  Dehydration, hypomagnesemia, hyponatremia, hypokalemia, hypocalcemia ____________________________________________   LABS (all labs ordered are listed, but only abnormal results are displayed)  Labs Reviewed  CBC WITH DIFFERENTIAL/PLATELET - Abnormal; Notable for the following components:      Result Value   WBC 24.4 (*)  RBC 5.95 (*)    RDW 14.9 (*)    Neutro Abs 16.5 (*)    Lymphs Abs 5.3 (*)    Monocytes Absolute 1.9 (*)    Basophils Absolute 0.2 (*)    All other components within normal limits  COMPREHENSIVE METABOLIC PANEL - Abnormal; Notable for the following components:   CO2 20 (*)    Glucose, Bld 162 (*)    BUN 22 (*)    Total Protein 8.3 (*)    All other components within normal limits     _______ Appreciate the patient's elevated white count however he is currently taking steroids ___________________________________  EKG    ____________________________________________  RADIOLOGY   ____________________________________________   PROCEDURES  Procedure(s) performed: no  Procedures  Critical Care performed: no  Observation: no ____________________________________________   INITIAL IMPRESSION / ASSESSMENT AND PLAN / ED COURSE  Pertinent labs & imaging results that were available during my care of the patient were reviewed by me and considered in my medical decision making (see chart for  details).  Differential is broad in this patient with subacute cramping pain.  We will begin with CBC and electrolytes as well as fluids and haloperidol for the patient's anxiety and pain.     ----------------------------------------- 2:25 PM on 09/03/2017 -----------------------------------------  Patient is resting comfortably when I wake him up he is currently asymptomatic with no pain.  Appreciate his elevated white count but he is currently taking steroids.  As he is currently in no pain and feels better after hydration I have encouraged him to remain well-hydrated and to follow-up with his primary care physician in 2 days.  He is discharged home in improved condition verbalizes understanding and agrees with the plan ____________________________________________   FINAL CLINICAL IMPRESSION(S) / ED DIAGNOSES  Final diagnoses:  Muscle cramps  Dehydration      NEW MEDICATIONS STARTED DURING THIS VISIT:  This SmartLink is deprecated. Use AVSMEDLIST instead to display the medication list for a patient.   Note:  This document was prepared using Dragon voice recognition software and may include unintentional dictation errors.     Darel Hong, MD 09/03/17 1438

## 2017-09-03 NOTE — ED Notes (Signed)
This RN went in to discharge pt and he is asleep and snoring. Will give him some more time for ativan to wear off so he is able to wait safely in the lobby for his ride.

## 2017-09-03 NOTE — ED Triage Notes (Signed)
Pt brought in by [facility] from home for generalized body cramps, pt seen x 3 weeks ago for same and was given medication that helped, his PCP changed his medication to Baclofen but this has not helped. Pt states that his pain is 10/10. Pt was given 50 mcg of Fentanyl by EMS in route. Pt states that this did not help with his pain.

## 2017-09-03 NOTE — ED Notes (Signed)
Pt ambulatory upon discharge. This RN walked pt to lobby to make sure he was steady enough on his feet s/p ativan administration at 1245. Pt waiting for ride in lobby at this time. VSS. A&O x4. Skin warm and dry. Pt verbalized understanding of discharge instructions, follow-up care and importance of keeping hydrated.

## 2017-09-03 NOTE — Discharge Instructions (Signed)
Please make sure you remain well-hydrated and follow-up with your primary care physician in 2 days for recheck.  Return to the emergency department sooner for any concerns.  It was a pleasure to take care of you today, and thank you for coming to our emergency department.  If you have any questions or concerns before leaving please ask the nurse to grab me and I'm more than happy to go through your aftercare instructions again.  If you were prescribed any opioid pain medication today such as Norco, Vicodin, Percocet, morphine, hydrocodone, or oxycodone please make sure you do not drive when you are taking this medication as it can alter your ability to drive safely.  If you have any concerns once you are home that you are not improving or are in fact getting worse before you can make it to your follow-up appointment, please do not hesitate to call 911 and come back for further evaluation.  Darel Hong, MD  Results for orders placed or performed during the hospital encounter of 09/03/17  CBC with Differential  Result Value Ref Range   WBC 24.4 (H) 3.8 - 10.6 K/uL   RBC 5.95 (H) 4.40 - 5.90 MIL/uL   Hemoglobin 16.4 13.0 - 18.0 g/dL   HCT 50.6 40.0 - 52.0 %   MCV 85.0 80.0 - 100.0 fL   MCH 27.5 26.0 - 34.0 pg   MCHC 32.4 32.0 - 36.0 g/dL   RDW 14.9 (H) 11.5 - 14.5 %   Platelets 372 150 - 440 K/uL   Neutrophils Relative % 67 %   Neutro Abs 16.5 (H) 1.4 - 6.5 K/uL   Lymphocytes Relative 22 %   Lymphs Abs 5.3 (H) 1.0 - 3.6 K/uL   Monocytes Relative 8 %   Monocytes Absolute 1.9 (H) 0.2 - 1.0 K/uL   Eosinophils Relative 2 %   Eosinophils Absolute 0.5 0 - 0.7 K/uL   Basophils Relative 1 %   Basophils Absolute 0.2 (H) 0 - 0.1 K/uL  Comprehensive metabolic panel  Result Value Ref Range   Sodium 135 135 - 145 mmol/L   Potassium 5.0 3.5 - 5.1 mmol/L   Chloride 104 101 - 111 mmol/L   CO2 20 (L) 22 - 32 mmol/L   Glucose, Bld 162 (H) 65 - 99 mg/dL   BUN 22 (H) 6 - 20 mg/dL   Creatinine,  Ser 1.18 0.61 - 1.24 mg/dL   Calcium 9.8 8.9 - 10.3 mg/dL   Total Protein 8.3 (H) 6.5 - 8.1 g/dL   Albumin 4.3 3.5 - 5.0 g/dL   AST 31 15 - 41 U/L   ALT 46 17 - 63 U/L   Alkaline Phosphatase 122 38 - 126 U/L   Total Bilirubin 0.8 0.3 - 1.2 mg/dL   GFR calc non Af Amer >60 >60 mL/min   GFR calc Af Amer >60 >60 mL/min   Anion gap 11 5 - 15   Dg Chest 2 View  Result Date: 08/24/2017 CLINICAL DATA:  Abdominal firmness. Hypoactive bowel sounds. Cough. Abdominal tenderness. EXAM: CHEST  2 VIEW COMPARISON:  08/06/2017, 08/18/2016, 08/06/2016, 10/01/2013, 06/28/2012 . FINDINGS: Mediastinum and hilar structures are normal. Heart size normal. Biapical pleural-parenchymal thickening consistent with scarring. No focal alveolar infiltrate. No pleural effusion or pneumothorax. Thoracic spine degenerative change. IMPRESSION: No acute cardiopulmonary disease. Biapical pleural-parenchymal thickening consistent with scarring . Electronically Signed   By: Marcello Moores  Register   On: 08/24/2017 15:56   Dg Chest 2 View  Result Date: 08/06/2017 CLINICAL DATA:  Short of breath for 1 week.  Shaking. EXAM: CHEST  2 VIEW COMPARISON:  [id number] FINDINGS: Normal mediastinum and cardiac silhouette. Normal pulmonary vasculature. No evidence of effusion, infiltrate, or pneumothorax. No acute bony abnormality. IMPRESSION: No acute cardiopulmonary process. Electronically Signed   By: Suzy Bouchard M.D.   On: 08/06/2017 13:31

## 2018-03-12 NOTE — Progress Notes (Signed)
03/13/2018 1:37 PM   Mark Vang Sep 21, 1961 [id number]  Referring provider: Jodi Marble, MD [place], [place] [address]  Chief Complaint  Patient presents with  . Nephrolithiasis    HPI: Patient is a 57 year old Caucasian male who was referred by Threasa Alpha, NP for possible bilateral nephrolithiasis.  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    He states that he had a RUS at his PCP office and was told he had 12 stones in his right kidney and 20 stones in the left kidney.  He has been having intermittent pain in his left lower quadrant that radiates to the left flank pain.  This started two months ago.  He states the pain has not been present for the last week.  He states the pain can be intense.  Patient denies any gross hematuria, dysuria or suprapubic/flank pain.  Patient denies any fevers, chills, nausea or vomiting.   His only urinary complaint at this time is nocturia.  His UA is negative.    PMH: Past Medical History:  Diagnosis Date  . COPD (chronic obstructive pulmonary disease) ([place])   . Hypertension   . Stroke [facility])     Surgical History: Past Surgical History:  Procedure Laterality Date  . KNEE SURGERY    . NASAL SINUS SURGERY      Home Medications:  Allergies as of 03/13/2018      Reactions   Amlodipine Besy-benazepril Hcl Other (See Comments)   Scales, burning, itching, rash   Amlodipine       Medication List        Accurate as of 03/13/18  1:37 PM. Always use your most recent med list.          amLODipine 5 MG tablet Commonly known as:  NORVASC Take 5 mg by mouth daily.   amoxicillin-clavulanate 875-125 MG tablet Commonly known as:  AUGMENTIN Take 1 tablet by mouth 2 (two) times daily.   aspirin 325 MG EC tablet Take 325 mg by mouth daily.   atorvastatin 40 MG tablet Commonly known as:  LIPITOR Take 40 mg by mouth daily.   cholecalciferol 1000 units tablet Commonly known as:  VITAMIN D Take 1,000 Units  by mouth daily.   dexlansoprazole 60 MG capsule Commonly known as:  DEXILANT Take 60 mg by mouth daily.   fluticasone 50 MCG/ACT nasal spray Commonly known as:  FLONASE Place into both nostrils daily.   lisinopril 20 MG tablet Commonly known as:  PRINIVIL,ZESTRIL Take 20 mg by mouth daily.   multivitamin tablet Take 1 tablet by mouth daily.   Oxcarbazepine 300 MG tablet Commonly known as:  TRILEPTAL Take 300 mg by mouth 2 (two) times daily.   OXYGEN Inhale into the lungs.   potassium citrate 10 MEQ (1080 MG) SR tablet Commonly known as:  UROCIT-K Take 10 mEq by mouth 3 (three) times daily with meals.   TRELEGY ELLIPTA 100-62.5-25 MCG/INH Aepb Generic drug:  Fluticasone-Umeclidin-Vilant Inhale into the lungs.       Allergies:  Allergies  Allergen Reactions  . Amlodipine Besy-Benazepril Hcl Other (See Comments)    Scales, burning, itching, rash  . Amlodipine     Family History: Family History  Problem Relation Age of Onset  . Cancer Mother   . Cancer Father   . Bladder Cancer Neg Hx   . Kidney cancer Neg Hx   . Prostate cancer Neg Hx     Social History:  reports that he has  been smoking cigarettes.  He has been smoking about 1.00 pack per day. He has never used smokeless tobacco. He reports that he does not drink alcohol or use drugs.  ROS: UROLOGY Frequent Urination?: No Hard to postpone urination?: No Burning/pain with urination?: No Get up at night to urinate?: Yes Leakage of urine?: No Urine stream starts and stops?: No Trouble starting stream?: No Do you have to strain to urinate?: No Blood in urine?: No Urinary tract infection?: No Sexually transmitted disease?: No Injury to kidneys or bladder?: No Painful intercourse?: No Weak stream?: No Erection problems?: No Penile pain?: No  Gastrointestinal Nausea?: No Vomiting?: No Indigestion/heartburn?: No Diarrhea?: No Constipation?: No  Constitutional Fever: No Night sweats?: No Weight  loss?: No Fatigue?: Yes  Skin Skin rash/lesions?: Yes Itching?: No  Eyes Blurred vision?: No Double vision?: No  Ears/Nose/Throat Sore throat?: No Sinus problems?: Yes  Hematologic/Lymphatic Swollen glands?: No Easy bruising?: No  Cardiovascular Leg swelling?: No Chest pain?: No  Respiratory Cough?: Yes Shortness of breath?: Yes  Endocrine Excessive thirst?: No  Musculoskeletal Back pain?: Yes Joint pain?: Yes  Neurological Headaches?: Yes Dizziness?: Yes  Psychologic Depression?: No Anxiety?: No  Physical Exam: BP (!) 150/84 (BP Location: Right Arm, Patient Position: Sitting, Cuff Size: Normal)   Pulse 89   Ht 6' (1.829 m)   Wt 255 lb 12.8 oz (116 kg)   BMI 34.69 kg/m   Constitutional:  Well nourished. Alert and oriented, No acute distress. HEENT: [place] AT, moist mucus membranes.  Trachea midline, no masses. Cardiovascular: No clubbing, cyanosis, or edema. Respiratory: Normal respiratory effort, no increased work of breathing. GI: Abdomen is soft, non tender, non distended, no abdominal masses. Liver and spleen not palpable.  No hernias appreciated.  Stool sample for occult testing is not indicated.   GU: No CVA tenderness.  No bladder fullness or masses.   Skin: No rashes, bruises or suspicious lesions. Lymph: No cervical or inguinal adenopathy. Neurologic: Grossly intact, no focal deficits, moving all 4 extremities. Psychiatric: Normal mood and affect.  Laboratory Data: Lab Results  Component Value Date   WBC 24.4 (H) 09/03/2017   HGB 16.4 09/03/2017   HCT 50.6 09/03/2017   MCV 85.0 09/03/2017   PLT 372 09/03/2017    Lab Results  Component Value Date   CREATININE 1.18 09/03/2017    No results found for: PSA  No results found for: TESTOSTERONE  Lab Results  Component Value Date   HGBA1C 6.0 06/29/2012    No results found for: TSH     Component Value Date/Time   CHOL 143 10/02/2013 0451   HDL 21 (L) 10/02/2013 0451   VLDL 31  10/02/2013 0451   LDLCALC 91 10/02/2013 0451    Lab Results  Component Value Date   AST 31 09/03/2017   Lab Results  Component Value Date   ALT 46 09/03/2017   No components found for: ALKALINEPHOPHATASE No components found for: BILIRUBINTOTAL  No results found for: ESTRADIOL  Urinalysis Negative.  See Epic.    I have reviewed the labs.   Assessment & Plan:    1. Left flank pain RUS in PCP's office demonstrated bilateral nephrolithiasis Will obtain CT Renal stone study as the RUS does not give the detail needed to decide on treatment plan  Advised to contact our office or seek treatment in the ED if becomes febrile or pain/ vomiting are difficult control in order to arrange for emergent/urgent intervention  Return for CT report.  These notes  generated with voice recognition software. I apologize for typographical errors.  Zara Council, PA-C  [facility] [address]  [place], [place] [address] [phone]

## 2018-03-13 ENCOUNTER — Ambulatory Visit: Payer: Medicaid Other | Admitting: Urology

## 2018-03-13 ENCOUNTER — Encounter: Payer: Self-pay | Admitting: Urology

## 2018-03-13 VITALS — BP 150/84 | HR 89 | Ht 72.0 in | Wt 255.8 lb

## 2018-03-13 DIAGNOSIS — R109 Unspecified abdominal pain: Secondary | ICD-10-CM

## 2018-03-13 DIAGNOSIS — N200 Calculus of kidney: Secondary | ICD-10-CM

## 2018-03-13 LAB — URINALYSIS, COMPLETE
BILIRUBIN UA: NEGATIVE
GLUCOSE, UA: NEGATIVE
LEUKOCYTES UA: NEGATIVE
Nitrite, UA: NEGATIVE
RBC, UA: NEGATIVE
Specific Gravity, UA: 1.02 (ref 1.005–1.030)
Urobilinogen, Ur: 1 mg/dL (ref 0.2–1.0)
pH, UA: 5.5 (ref 5.0–7.5)

## 2018-03-14 LAB — CBC WITH DIFFERENTIAL/PLATELET
BASOS: 1 %
Basophils Absolute: 0.1 10*3/uL (ref 0.0–0.2)
EOS (ABSOLUTE): 0.3 10*3/uL (ref 0.0–0.4)
Eos: 2 %
Hematocrit: 42 % (ref 37.5–51.0)
Hemoglobin: 14 g/dL (ref 13.0–17.7)
IMMATURE GRANS (ABS): 0.1 10*3/uL (ref 0.0–0.1)
IMMATURE GRANULOCYTES: 1 %
LYMPHS: 34 %
Lymphocytes Absolute: 4.7 10*3/uL — ABNORMAL HIGH (ref 0.7–3.1)
MCH: 28.6 pg (ref 26.6–33.0)
MCHC: 33.3 g/dL (ref 31.5–35.7)
MCV: 86 fL (ref 79–97)
Monocytes Absolute: 1.3 10*3/uL — ABNORMAL HIGH (ref 0.1–0.9)
Monocytes: 9 %
NEUTROS PCT: 53 %
Neutrophils Absolute: 7.4 10*3/uL — ABNORMAL HIGH (ref 1.4–7.0)
PLATELETS: 344 10*3/uL (ref 150–450)
RBC: 4.9 x10E6/uL (ref 4.14–5.80)
RDW: 14.4 % (ref 12.3–15.4)
WBC: 13.8 10*3/uL — ABNORMAL HIGH (ref 3.4–10.8)

## 2018-03-14 LAB — BASIC METABOLIC PANEL
BUN/Creatinine Ratio: 11 (ref 9–20)
BUN: 14 mg/dL (ref 6–24)
CALCIUM: 9.5 mg/dL (ref 8.7–10.2)
CHLORIDE: 104 mmol/L (ref 96–106)
CO2: 19 mmol/L — AB (ref 20–29)
Creatinine, Ser: 1.24 mg/dL (ref 0.76–1.27)
GFR calc Af Amer: 73 mL/min/{1.73_m2} (ref >59)
GFR calc non Af Amer: 63 mL/min/{1.73_m2} (ref >59)
GLUCOSE: 112 mg/dL — AB (ref 65–99)
Potassium: 4.6 mmol/L (ref 3.5–5.2)
Sodium: 141 mmol/L (ref 134–144)

## 2018-03-16 LAB — CULTURE, URINE COMPREHENSIVE

## 2018-03-26 ENCOUNTER — Other Ambulatory Visit: Payer: Self-pay | Admitting: Urology

## 2018-03-26 ENCOUNTER — Telehealth: Payer: Self-pay | Admitting: Urology

## 2018-03-26 DIAGNOSIS — N200 Calculus of kidney: Secondary | ICD-10-CM

## 2018-03-26 NOTE — Telephone Encounter (Signed)
Pt called office asking about scheduling his CT before his return appt to see Larene Beach, upon reviewing his chart, there are no orders for CT to call him to schedule. Please advise. Thanks.

## 2018-03-26 NOTE — Progress Notes (Signed)
CT orders are in.

## 2018-04-02 ENCOUNTER — Ambulatory Visit
Admission: RE | Admit: 2018-04-02 | Discharge: 2018-04-02 | Disposition: A | Discharge status: Home / Self Care | Payer: Medicaid Other | Source: Ambulatory Visit | Attending: Nurse Practitioner | Admitting: Nurse Practitioner

## 2018-04-02 ENCOUNTER — Other Ambulatory Visit
Admission: RE | Admit: 2018-04-02 | Discharge: 2018-04-02 | Disposition: A | Discharge status: Home / Self Care | Payer: Medicaid Other | Source: Ambulatory Visit | Attending: Nurse Practitioner | Admitting: Nurse Practitioner

## 2018-04-02 ENCOUNTER — Other Ambulatory Visit: Payer: Self-pay

## 2018-04-02 ENCOUNTER — Ambulatory Visit: Payer: Medicaid Other | Attending: Nurse Practitioner | Admitting: Nurse Practitioner

## 2018-04-02 ENCOUNTER — Encounter: Payer: Self-pay | Admitting: Nurse Practitioner

## 2018-04-02 VITALS — BP 112/92 | HR 95 | Temp 98.7°F | Resp 18 | Ht 72.0 in | Wt 255.0 lb

## 2018-04-02 DIAGNOSIS — M5441 Lumbago with sciatica, right side: Secondary | ICD-10-CM | POA: Insufficient documentation

## 2018-04-02 DIAGNOSIS — J449 Chronic obstructive pulmonary disease, unspecified: Secondary | ICD-10-CM | POA: Insufficient documentation

## 2018-04-02 DIAGNOSIS — M899 Disorder of bone, unspecified: Secondary | ICD-10-CM | POA: Diagnosis not present

## 2018-04-02 DIAGNOSIS — M25552 Pain in left hip: Secondary | ICD-10-CM

## 2018-04-02 DIAGNOSIS — M79644 Pain in right finger(s): Secondary | ICD-10-CM | POA: Diagnosis not present

## 2018-04-02 DIAGNOSIS — M48061 Spinal stenosis, lumbar region without neurogenic claudication: Secondary | ICD-10-CM | POA: Diagnosis not present

## 2018-04-02 DIAGNOSIS — M25572 Pain in left ankle and joints of left foot: Secondary | ICD-10-CM | POA: Diagnosis not present

## 2018-04-02 DIAGNOSIS — M47812 Spondylosis without myelopathy or radiculopathy, cervical region: Secondary | ICD-10-CM | POA: Diagnosis not present

## 2018-04-02 DIAGNOSIS — M19042 Primary osteoarthritis, left hand: Secondary | ICD-10-CM | POA: Insufficient documentation

## 2018-04-02 DIAGNOSIS — M19041 Primary osteoarthritis, right hand: Secondary | ICD-10-CM | POA: Insufficient documentation

## 2018-04-02 DIAGNOSIS — M25551 Pain in right hip: Secondary | ICD-10-CM | POA: Diagnosis not present

## 2018-04-02 DIAGNOSIS — M79646 Pain in unspecified finger(s): Secondary | ICD-10-CM | POA: Diagnosis not present

## 2018-04-02 DIAGNOSIS — M545 Low back pain, unspecified: Secondary | ICD-10-CM | POA: Insufficient documentation

## 2018-04-02 DIAGNOSIS — M5442 Lumbago with sciatica, left side: Secondary | ICD-10-CM | POA: Insufficient documentation

## 2018-04-02 DIAGNOSIS — I700 Atherosclerosis of aorta: Secondary | ICD-10-CM | POA: Diagnosis not present

## 2018-04-02 DIAGNOSIS — M79645 Pain in left finger(s): Secondary | ICD-10-CM | POA: Diagnosis not present

## 2018-04-02 DIAGNOSIS — Z79891 Long term (current) use of opiate analgesic: Secondary | ICD-10-CM | POA: Insufficient documentation

## 2018-04-02 DIAGNOSIS — M533 Sacrococcygeal disorders, not elsewhere classified: Secondary | ICD-10-CM | POA: Insufficient documentation

## 2018-04-02 DIAGNOSIS — G894 Chronic pain syndrome: Secondary | ICD-10-CM | POA: Diagnosis not present

## 2018-04-02 DIAGNOSIS — Z789 Other specified health status: Secondary | ICD-10-CM

## 2018-04-02 DIAGNOSIS — Z79899 Other long term (current) drug therapy: Secondary | ICD-10-CM

## 2018-04-02 DIAGNOSIS — G8929 Other chronic pain: Secondary | ICD-10-CM

## 2018-04-02 DIAGNOSIS — I10 Essential (primary) hypertension: Secondary | ICD-10-CM | POA: Insufficient documentation

## 2018-04-02 DIAGNOSIS — M79604 Pain in right leg: Secondary | ICD-10-CM

## 2018-04-02 DIAGNOSIS — M19072 Primary osteoarthritis, left ankle and foot: Secondary | ICD-10-CM | POA: Insufficient documentation

## 2018-04-02 DIAGNOSIS — M79605 Pain in left leg: Secondary | ICD-10-CM

## 2018-04-02 DIAGNOSIS — Z8673 Personal history of transient ischemic attack (TIA), and cerebral infarction without residual deficits: Secondary | ICD-10-CM | POA: Diagnosis not present

## 2018-04-02 LAB — COMPREHENSIVE METABOLIC PANEL
ALT: 36 U/L (ref 0–44)
AST: 26 U/L (ref 15–41)
Albumin: 4.5 g/dL (ref 3.5–5.0)
Alkaline Phosphatase: 108 U/L (ref 38–126)
Anion gap: 9 (ref 5–15)
BUN: 22 mg/dL — ABNORMAL HIGH (ref 6–20)
CHLORIDE: 103 mmol/L (ref 98–111)
CO2: 25 mmol/L (ref 22–32)
CREATININE: 1.01 mg/dL (ref 0.61–1.24)
Calcium: 9.4 mg/dL (ref 8.9–10.3)
GFR calc non Af Amer: >60 mL/min (ref >60)
Glucose, Bld: 113 mg/dL — ABNORMAL HIGH (ref 70–99)
Potassium: 4.1 mmol/L (ref 3.5–5.1)
SODIUM: 137 mmol/L (ref 135–145)
Total Bilirubin: 0.6 mg/dL (ref 0.3–1.2)
Total Protein: 8 g/dL (ref 6.5–8.1)

## 2018-04-02 LAB — SEDIMENTATION RATE: Sed Rate: 2 mm/hr (ref 0–20)

## 2018-04-02 LAB — C-REACTIVE PROTEIN: CRP: <0.8 mg/dL (ref <1.0)

## 2018-04-02 LAB — URINE DRUG SCREEN, QUALITATIVE (ARMC ONLY)
Amphetamines, Ur Screen: NOT DETECTED
Benzodiazepine, Ur Scrn: NOT DETECTED
COCAINE METABOLITE, UR ~~LOC~~: NOT DETECTED
Cannabinoid 50 Ng, Ur ~~LOC~~: NOT DETECTED
MDMA (ECSTASY) UR SCREEN: NOT DETECTED
METHADONE SCREEN, URINE: NOT DETECTED
Opiate, Ur Screen: NOT DETECTED
Phencyclidine (PCP) Ur S: NOT DETECTED
Tricyclic, Ur Screen: NOT DETECTED

## 2018-04-02 LAB — MAGNESIUM: MAGNESIUM: 2 mg/dL (ref 1.7–2.4)

## 2018-04-02 LAB — VITAMIN B12: Vitamin B-12: 186 pg/mL (ref 180–914)

## 2018-04-02 NOTE — Progress Notes (Addendum)
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  08/06/17 260 lb (117.9 kg)  Psych/Mental status: Alert, oriented x 3 (person, place, & time)       Eyes: PERLA Respiratory: No evidence of acute respiratory distress  Cervical Spine Exam  Inspection: No masses, redness, or swelling Alignment: Symmetrical Functional ROM: Unrestricted ROM      Stability: No instability detected Muscle strength & Tone: Functionally intact Sensory: Unimpaired Palpation: Complains of area being tender to palpation              Upper Extremity (UE) Exam    Side: Right upper extremity  Side: Left upper extremity  Inspection: No masses, redness, swelling, or asymmetry. No contractures  Inspection: No masses, redness, swelling, or asymmetry. No contractures  Functional ROM: Unrestricted ROM          Functional ROM: Unrestricted ROM          Muscle strength & Tone: Movement possible, but not against gravity (2/5)  Muscle strength & Tone: Functionally intact  Sensory: Unimpaired  Sensory:  Unimpaired  Palpation: No palpable anomalies              Palpation: No palpable anomalies              Specialized Test(s): Deferred         Specialized Test(s): Deferred          Thoracic Spine Exam  Inspection: No masses, redness, or swelling Alignment: Symmetrical Functional ROM: Unrestricted ROM Stability: No instability detected Sensory: Unimpaired Muscle strength & Tone: No palpable anomalies  Lumbar Spine Exam  Inspection: No masses, redness, or swelling Alignment: Symmetrical Functional ROM:  Unrestricted ROM      Stability: No instability detected Muscle strength & Tone: Functionally intact Sensory: Unimpaired Palpation: Complains of area being tender to palpation       Provocative Tests: Lumbar Hyperextension and rotation test: Unable to perform       Patrick's Maneuver: Positive for bilateral S-I arthralgia              Gait & Posture Assessment  Ambulation: Unassisted Gait: Antalgic Posture: WNL   Lower Extremity Exam    Side: Right lower extremity  Side: Left lower extremity  Inspection: No masses, redness, swelling, or asymmetry. No contractures  Inspection: No masses, redness, swelling, or asymmetry. No contractures  Functional ROM: Unrestricted ROM          Functional ROM: Unrestricted ROM          Muscle strength & Tone: Functionally intact  Muscle strength & Tone: Functionally intact  Sensory: Unimpaired  Sensory: Unimpaired  Palpation: No palpable anomalies  Palpation: No palpable anomalies   Assessment  Primary Diagnosis & Pertinent Problem List: The primary encounter diagnosis was Chronic sacroiliac joint pain. Diagnoses of Chronic pain of both lower extremities, Osteoarthritis of cervical spine, unspecified spinal osteoarthritis complication status, Chronic pain of both hips, Chronic bilateral low back pain with bilateral sciatica, Chronic pain syndrome, Long term current use of opiate analgesic, Pharmacologic therapy, Disorder of skeletal system, Problems  influencing health status, Chronic thumb pain, bilateral, and Pain of joint of left ankle and foot were also pertinent to this visit.  Visit Diagnosis: 1. Chronic sacroiliac joint pain   2. Chronic pain of both lower extremities   3. Osteoarthritis of cervical spine, unspecified spinal osteoarthritis complication status   4. Chronic pain of both hips   5. Chronic bilateral low back pain with bilateral sciatica   6. Chronic pain syndrome   7. Long term current use of opiate analgesic   8. Pharmacologic therapy   9. Disorder of skeletal system   10. Problems influencing health status   11. Chronic thumb pain, bilateral   12. Pain of joint of left ankle and foot    Plan of Care  Initial treatment plan:  Please be advised that as per protocol, today's visit has been an evaluation only. We have not taken over the patient's controlled substance management.  Problem-specific plan: No problem-specific Assessment & Plan notes found for this encounter.  Ordered Lab-work, Procedure(s), Referral(s), & Consult(s): Orders Placed This Encounter  Procedures  . DG Lumbar Spine Complete W/Bend  . DG Hand Complete Left  . DG Hand Complete Right  . DG HIP UNILAT W OR W/O PELVIS 2-3 VIEWS LEFT  . DG HIP UNILAT W OR W/O PELVIS 2-3 VIEWS RIGHT  . DG Si Joints  . DG Foot Complete Left  . Compliance Drug Analysis, Ur  . Comp. Metabolic Panel (12)  . Magnesium  . Vitamin B12  . Sedimentation rate  . 25-Hydroxyvitamin D Lcms D2+D3  . C-reactive protein  . NCV with EMG(electromyography)   Pharmacotherapy: Medications ordered:  No orders of the defined types were placed in this encounter.  Medications administered during this visit: Mark Vang had no medications administered during this visit.   Pharmacotherapy under consideration:  Opioid Analgesics: The patient was informed that there is no guarantee that he would be a candidate for opioid analgesics. The decision will be made following  CDC guidelines. This decision will be based on the results of diagnostic studies, as well as Mark Vang's risk  profile.  Membrane stabilizer: To be determined at a later time Muscle relaxant: To be determined at a later time NSAID: To be determined at a later time Other analgesic(s): To be determined at a later time   Interventional therapies under consideration: Mark Vang was informed that there is no guarantee that he would be a candidate for interventional therapies. The decision will be based on the results of diagnostic studies, as well as Mark Vang's risk profile.  Possible procedure(s): Diagnostic bilateral lumbar epidural steroid injection Diagnostic bilateral lumbar facet nerve block Possible bilateral lumbar facet radiofrequency ablation Diagnostic bilateral cervical facet nerve block Possible bilateral cervical facet frequency ablation Diagnostic trigger point injections Diagnostic bilateral intra-articular hip injections Diagnostic greater occipital nerve block Diagnostic bilateral articular thumb injections   Provider-requested follow-up: Return for 2nd Visit, w/ Dr. Dossie Arbour, xrays before return for second visit.  Future Appointments  Date Time Provider [place]  04/06/2018  3:00 PM [facility]  04/09/2018  1:45 PM McGowan, Hunt Oris, PA-C [facility] None  04/30/2018 11:30 AM Milinda Pointer, MD [facility] None    Primary Care Physician: Remi Haggard, FNP Location: [facility] Note by:  Date: 04/02/2018; Time: 8:08 PM  Pain Score Disclaimer: We use the NRS-11 scale. This is a self-reported, subjective measurement of pain severity with only modest accuracy. It is used primarily to identify changes within a particular patient. It must be understood that outpatient pain scales are significantly less accurate that those used for research, where they can be applied under ideal controlled circumstances with minimal  exposure to variables. In reality, the score is likely to be a combination of pain intensity and pain affect, where pain affect describes the degree of emotional arousal or changes in action readiness caused by the sensory experience of pain. Factors such as social and work situation, setting, emotional state, anxiety levels, expectation, and prior pain experience may influence pain perception and show large inter-individual differences that may also be affected by time variables.  Patient instructions provided during this appointment: Patient Instructions   ____________________________________________________________________________________________  Appointment Policy Summary  It is our goal and responsibility to provide the medical community with assistance in the evaluation and management of patients with chronic pain. Unfortunately our resources are limited. Because we do not have an unlimited amount of time, or available appointments, we are required to closely monitor and manage their use. The following rules exist to maximize their use:  Patient's responsibilities: 1. Punctuality:  At what time should I arrive? You should be physically present in our office 30 minutes before your scheduled appointment. Your scheduled appointment is with your assigned healthcare provider. However, it takes 5-10 minutes to be "checked-in", and another 15 minutes for the nurses to do the admission. If you arrive to our office at the time you were given for your appointment, you will end up being at least 20-25 minutes late to your appointment with the provider. 2. Tardiness:  What happens if I arrive only a few minutes after my scheduled appointment time? You will need to reschedule your appointment. The cutoff is your appointment time. This is why it is so important that you arrive at least 30 minutes before that appointment. If you have an appointment scheduled for 10:00 AM and you arrive at 10:01, you will be  required to reschedule your appointment.  3. Plan ahead:  Always assume that you will encounter traffic on your way in. Plan for it. If you are dependent on  a driver, make sure they understand these rules and the need to arrive early. 4. Other appointments and responsibilities:  Avoid scheduling any other appointments before or after your pain clinic appointments.  5. Be prepared:  Write down everything that you need to discuss with your healthcare provider and give this information to the admitting nurse. Write down the medications that you will need refilled. Bring your pills and bottles (even the empty ones), to all of your appointments, except for those where a procedure is scheduled. 6. No children or pets:  Find someone to take care of them. It is not appropriate to bring them in. 7. Scheduling changes:  We request "advanced notification" of any changes or cancellations. 8. Advanced notification:  Defined as a time period of more than 24 hours prior to the originally scheduled appointment. This allows for the appointment to be offered to other patients. 9. Rescheduling:  When a visit is rescheduled, it will require the cancellation of the original appointment. For this reason they both fall within the category of "Cancellations".  10. Cancellations:  They require advanced notification. Any cancellation less than 24 hours before the  appointment will be recorded as a "No Show". 11. No Show:  Defined as an unkept appointment where the patient failed to notify or declare to the practice their intention or inability to keep the appointment.  Corrective process for repeat offenders:  1. Tardiness: Three (3) episodes of rescheduling due to late arrivals will be recorded as one (1) "No Show". 2. Cancellation or reschedule: Three (3) cancellations or rescheduling will be recorded as one (1) "No Show". 3. "No Shows": Three (3) "No Shows" within a 12 month period will result in discharge from the  practice. ____________________________________________________________________________________________  ____________________________________________________________________________________________  Pain Scale  Introduction: The pain score used by this practice is the Verbal Numerical Rating Scale (VNRS-11). This is an 11-point scale. It is for adults and children 10 years or older. There are significant differences in how the pain score is reported, used, and applied. Forget everything you learned in the past and learn this scoring system.  General Information: The scale should reflect your current level of pain. Unless you are specifically asked for the level of your worst pain, or your average pain. If you are asked for one of these two, then it should be understood that it is over the past 24 hours.  Basic Activities of Daily Living (ADL): Personal hygiene, dressing, eating, transferring, and using restroom.  Instructions: Most patients tend to report their level of pain as a combination of two factors, their physical pain and their psychosocial pain. This last one is also known as "suffering" and it is reflection of how physical pain affects you socially and psychologically. From now on, report them separately. From this point on, when asked to report your pain level, report only your physical pain. Use the following table for reference.  Pain Clinic Pain Levels (0-5/10)  Pain Level Score  Description  No Pain 0   Mild pain 1 Nagging, annoying, but does not interfere with basic activities of daily living (ADL). Patients are able to eat, bathe, get dressed, toileting (being able to get on and off the toilet and perform personal hygiene functions), transfer (move in and out of bed or a chair without assistance), and maintain continence (able to control bladder and bowel functions). Blood pressure and heart rate are unaffected. A normal heart rate for a healthy adult ranges from 60 to 100 bpm  (beats  per minute).   Mild to moderate pain 2 Noticeable and distracting. Impossible to hide from other people. More frequent flare-ups. Still possible to adapt and function close to normal. It can be very annoying and may have occasional stronger flare-ups. With discipline, patients may get used to it and adapt.   Moderate pain 3 Interferes significantly with activities of daily living (ADL). It becomes difficult to feed, bathe, get dressed, get on and off the toilet or to perform personal hygiene functions. Difficult to get in and out of bed or a chair without assistance. Very distracting. With effort, it can be ignored when deeply involved in activities.   Moderately severe pain 4 Impossible to ignore for more than a few minutes. With effort, patients may still be able to manage work or participate in some social activities. Very difficult to concentrate. Signs of autonomic nervous system discharge are evident: dilated pupils (mydriasis); mild sweating (diaphoresis); sleep interference. Heart rate becomes elevated (>115 bpm). Diastolic blood pressure (lower number) rises above 100 mmHg. Patients find relief in laying down and not moving.   Severe pain 5 Intense and extremely unpleasant. Associated with frowning face and frequent crying. Pain overwhelms the senses.  Ability to do any activity or maintain social relationships becomes significantly limited. Conversation becomes difficult. Pacing back and forth is common, as getting into a comfortable position is nearly impossible. Pain wakes you up from deep sleep. Physical signs will be obvious: pupillary dilation; increased sweating; goosebumps; brisk reflexes; cold, clammy hands and feet; nausea, vomiting or dry heaves; loss of appetite; significant sleep disturbance with inability to fall asleep or to remain asleep. When persistent, significant weight loss is observed due to the complete loss of appetite and sleep deprivation.  Blood pressure and heart  rate becomes significantly elevated. Caution: If elevated blood pressure triggers a pounding headache associated with blurred vision, then the patient should immediately seek attention at an urgent or emergency care unit, as these may be signs of an impending stroke.    Emergency Department Pain Levels (6-10/10)  Emergency Room Pain 6 Severely limiting. Requires emergency care and should not be seen or managed at an outpatient pain management facility. Communication becomes difficult and requires great effort. Assistance to reach the emergency department may be required. Facial flushing and profuse sweating along with potentially dangerous increases in heart rate and blood pressure will be evident.   Distressing pain 7 Self-care is very difficult. Assistance is required to transport, or use restroom. Assistance to reach the emergency department will be required. Tasks requiring coordination, such as bathing and getting dressed become very difficult.   Disabling pain 8 Self-care is no longer possible. At this level, pain is disabling. The individual is unable to do even the most "basic" activities such as walking, eating, bathing, dressing, transferring to a bed, or toileting. Fine motor skills are lost. It is difficult to think clearly.   Incapacitating pain 9 Pain becomes incapacitating. Thought processing is no longer possible. Difficult to remember your own name. Control of movement and coordination are lost.   The worst pain imaginable 10 At this level, most patients pass out from pain. When this level is reached, collapse of the autonomic nervous system occurs, leading to a sudden drop in blood pressure and heart rate. This in turn results in a temporary and dramatic drop in blood flow to the brain, leading to a loss of consciousness. Fainting is one of the body's self defense mechanisms. Passing out puts the brain in  a calmed state and causes it to shut down for a while, in order to begin the  healing process.    Summary: 1. Refer to this scale when providing [place] with your pain level. 2. Be accurate and careful when reporting your pain level. This will help with your care. 3. Over-reporting your pain level will lead to loss of credibility. 4. Even a level of 1/10 means that there is pain and will be treated at our facility. 5. High, inaccurate reporting will be documented as "Symptom Exaggeration", leading to loss of credibility and suspicions of possible secondary gains such as obtaining more narcotics, or wanting to appear disabled, for fraudulent reasons. 6. Only pain levels of 5 or below will be seen at our facility. 7. Pain levels of 6 and above will be sent to the Emergency Department and the appointment cancelled. ____________________________________________________________________________________________

## 2018-04-02 NOTE — Progress Notes (Signed)
Safety precautions to be maintained throughout the outpatient stay will include: orient to surroundings, keep bed in low position, maintain call bell within reach at all times, provide assistance with transfer out of bed and ambulation.  

## 2018-04-02 NOTE — Patient Instructions (Signed)

## 2018-04-03 LAB — VITAMIN D 25 HYDROXY (VIT D DEFICIENCY, FRACTURES): Vit D, 25-Hydroxy: 35.6 ng/mL (ref 30.0–100.0)

## 2018-04-03 NOTE — Progress Notes (Signed)
Results were reviewed and found to be: abnormal  No acute injury or pathology identified  Review would suggest interventional pain management techniques may be of benefit

## 2018-04-03 NOTE — Progress Notes (Signed)
Results were reviewed and found to be: mildly abnormal  No acute injury or pathology identified  Review would suggest interventional pain management techniques may be of benefit 

## 2018-04-06 ENCOUNTER — Ambulatory Visit
Admission: RE | Admit: 2018-04-06 | Discharge: 2018-04-06 | Disposition: A | Discharge status: Home / Self Care | Payer: Medicaid Other | Source: Ambulatory Visit | Attending: Urology | Admitting: Urology

## 2018-04-06 ENCOUNTER — Encounter: Payer: Self-pay | Admitting: Podiatry

## 2018-04-06 DIAGNOSIS — N200 Calculus of kidney: Secondary | ICD-10-CM | POA: Insufficient documentation

## 2018-04-06 DIAGNOSIS — I700 Atherosclerosis of aorta: Secondary | ICD-10-CM | POA: Insufficient documentation

## 2018-04-06 NOTE — Progress Notes (Signed)
Medical records requested by [facility] were faxed through [place] per signed medical records release form.

## 2018-04-08 NOTE — Progress Notes (Signed)
04/09/2018 2:48 PM   [name] 1961/01/30 [id number]  Referring provider: Remi Haggard, [address] [place], [place] [address]  Chief Complaint  Patient presents with  . Nephrolithiasis    Follow up CT results    HPI: Patient is a 57 year old Caucasian male with a history of nephrolithiasis who presents today to review his CT results.    Background history Patient is a 57 year old Caucasian male who was referred by Threasa Alpha, NP for possible bilateral nephrolithiasis.  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  He states that he had a RUS at his PCP office and was told he had 12 stones in his right kidney and 20 stones in the left kidney.  He has been having intermittent pain in his left lower quadrant that radiates to the left flank pain.  This started two months ago.  He states the pain has not been present for the last week.  He states the pain can be intense.  Patient denies any gross hematuria, dysuria or suprapubic/flank pain.  Patient denies any fevers, chills, nausea or vomiting.   His only urinary complaint at this time is nocturia.  His UA is negative.    CT Renal stone study on 04/06/2018 noted normal adrenal glands.  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 Left upper pole calculus measures 4 mm. No ureteral calculi identified bilaterally. No mass or hydronephrosis. Urinary bladder appears Normal.  Today, he has been having pain in his right flank.   Patient denies any gross hematuria, dysuria or suprapubic/flank pain.  Patient denies any fevers, chills, nausea or vomiting.   He believes the pain is MSK in nature.  He states he is seeing the pain clinic soon.     PMH: Past Medical History:  Diagnosis Date  . COPD (chronic obstructive pulmonary disease) ([place])   . Hypertension   . Stroke [facility])     Surgical History: Past Surgical History:  Procedure Laterality Date  . KNEE SURGERY    . NASAL SINUS SURGERY      Home  Medications:  Allergies as of 04/09/2018      Reactions   Amlodipine Besy-benazepril Hcl Other (See Comments)   Scales, burning, itching, rash   Amlodipine       Medication List        Accurate as of 04/09/18  2:48 PM. Always use your most recent med list.          amLODipine 5 MG tablet Commonly known as:  NORVASC Take 5 mg by mouth daily.   aspirin 325 MG EC tablet Take 325 mg by mouth daily.   atorvastatin 40 MG tablet Commonly known as:  LIPITOR Take 40 mg by mouth daily.   cholecalciferol 1000 units tablet Commonly known as:  VITAMIN D Take 1,000 Units by mouth daily.   dexlansoprazole 60 MG capsule Commonly known as:  DEXILANT Take 60 mg by mouth daily.   fluticasone 50 MCG/ACT nasal spray Commonly known as:  FLONASE Place into both nostrils daily.   lisinopril 20 MG tablet Commonly known as:  PRINIVIL,ZESTRIL Take 20 mg by mouth daily.   multivitamin tablet Take 1 tablet by mouth daily.   Oxcarbazepine 300 MG tablet Commonly known as:  TRILEPTAL Take 300 mg by mouth 2 (two) times daily.   OXYGEN Inhale into the lungs.   potassium citrate 10 MEQ (1080 MG) SR tablet Commonly known as:  UROCIT-K Take 10 mEq  by mouth 3 (three) times daily with meals.   predniSONE 5 MG tablet Commonly known as:  DELTASONE Take by mouth.   TRELEGY ELLIPTA 100-62.5-25 MCG/INH Aepb Generic drug:  Fluticasone-Umeclidin-Vilant Inhale into the lungs.       Allergies:  Allergies  Allergen Reactions  . Amlodipine Besy-Benazepril Hcl Other (See Comments)    Scales, burning, itching, rash  . Amlodipine     Family History: Family History  Problem Relation Age of Onset  . Cancer Mother   . Cancer Father   . Bladder Cancer Neg Hx   . Kidney cancer Neg Hx   . Prostate cancer Neg Hx     Social History:  reports that he has been smoking cigarettes.  He has been smoking about 1.00 pack per day. He has never used smokeless tobacco. He reports that he does not drink  alcohol or use drugs.  ROS: UROLOGY Frequent Urination?: No Hard to postpone urination?: No Burning/pain with urination?: No Get up at night to urinate?: No Leakage of urine?: No Urine stream starts and stops?: No Trouble starting stream?: No Do you have to strain to urinate?: No Blood in urine?: No Urinary tract infection?: No Sexually transmitted disease?: No Injury to kidneys or bladder?: No Painful intercourse?: No Weak stream?: No Erection problems?: No Penile pain?: No  Gastrointestinal Nausea?: No Vomiting?: No Indigestion/heartburn?: No Diarrhea?: No Constipation?: No  Constitutional Fever: No Night sweats?: No Weight loss?: No Fatigue?: No  Skin Skin rash/lesions?: No Itching?: No  Eyes Blurred vision?: No Double vision?: No  Ears/Nose/Throat Sore throat?: No Sinus problems?: No  Hematologic/Lymphatic Swollen glands?: No Easy bruising?: No  Cardiovascular Leg swelling?: No Chest pain?: No  Respiratory Cough?: No Shortness of breath?: No  Endocrine Excessive thirst?: No  Musculoskeletal Back pain?: No Joint pain?: No  Neurological Headaches?: No Dizziness?: No  Psychologic Depression?: No Anxiety?: No  Physical Exam: BP 109/68   Pulse 90   Wt 246 lb (111.6 kg)   BMI 33.36 kg/m   Constitutional: Well nourished. Alert and oriented, No acute distress. HEENT: [place] AT, moist mucus membranes. Trachea midline, no masses. Cardiovascular: No clubbing, cyanosis, or edema. Respiratory: Normal respiratory effort, no increased work of breathing. Skin: No rashes, bruises or suspicious lesions. Lymph: No cervical or inguinal adenopathy. Neurologic: Grossly intact, no focal deficits, moving all 4 extremities. Psychiatric: Normal mood and affect.   Laboratory Data: Lab Results  Component Value Date   WBC 13.8 (H) 03/13/2018   HGB 14.0 03/13/2018   HCT 42.0 03/13/2018   MCV 86 03/13/2018   PLT 344 03/13/2018    Lab Results    Component Value Date   CREATININE 1.01 04/02/2018    No results found for: PSA  No results found for: TESTOSTERONE  Lab Results  Component Value Date   HGBA1C 6.0 06/29/2012    No results found for: TSH     Component Value Date/Time   CHOL 143 10/02/2013 0451   HDL 21 (L) 10/02/2013 0451   VLDL 31 10/02/2013 0451   LDLCALC 91 10/02/2013 0451    Lab Results  Component Value Date   AST 26 04/02/2018   Lab Results  Component Value Date   ALT 36 04/02/2018   No components found for: ALKALINEPHOPHATASE No components found for: BILIRUBINTOTAL  No results found for: ESTRADIOL    I have reviewed the labs.  Pertinent Imaging CLINICAL DATA:  Complains of intermittent left flank pain radiating into left lower quadrant.  EXAM: CT ABDOMEN  AND PELVIS WITHOUT CONTRAST  TECHNIQUE: Multidetector CT imaging of the abdomen and pelvis was performed following the standard protocol without IV contrast.  COMPARISON:  None  FINDINGS: Lower chest: No acute abnormality.  Hepatobiliary: No focal liver abnormality is seen. No gallstones, gallbladder wall thickening, or biliary dilatation.  Pancreas: Unremarkable. No pancreatic ductal dilatation or surrounding inflammatory changes.  Spleen: Normal in size without focal abnormality.  Adrenals/Urinary Tract: Normal adrenal glands.  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 Left upper pole calculus measures 4 mm. No ureteral calculi identified bilaterally. No mass or hydronephrosis. Urinary bladder appears normal.  Stomach/Bowel: Stomach appears normal. The small bowel loops have a normal course and caliber. The appendix is visualized and appears normal. No pathologic dilatation of the colon.  Vascular/Lymphatic: Aortic atherosclerosis. No aneurysm. No abdominal or pelvic adenopathy. No inguinal adenopathy.  Reproductive: Prostate is unremarkable.  Other: No abdominal wall hernia or  abnormality. No abdominopelvic ascites.  Musculoskeletal: Lumbosacral spondylosis noted.  IMPRESSION: 1. Bilateral nonobstructing renal calculi. 2.  Aortic Atherosclerosis (ICD10-I70.0).   Electronically Signed   By: Kerby Moors M.D.   On: 04/06/2018 15:24    I have independently reviewed the films  Assessment & Plan:    1. Flank pain Explained that non obstructing stones should not be causing pain  2. Bilateral nephrolithiasis Monitor with yearly KUB's - refilled Flomax  Advised to contact our office or seek treatment in the ED if becomes febrile or pain/ vomiting are difficult control in order to arrange for emergent/urgent intervention  3. PSA screening Discussed with the patient that the AUA panel feels that in men age 5 to 59 years, there was sufficient certainty that the benefits of screening could outweigh the harms - he states that he has been having yearly DRE's and PSA's with his PCP  Return in about 1 year (around 04/10/2019) for KUB and office visit .  These notes generated with voice recognition software. I apologize for typographical errors.  Zara Council, PA-C  [facility] [address]  [place], [place] [address] [phone]

## 2018-04-09 ENCOUNTER — Other Ambulatory Visit: Payer: Self-pay

## 2018-04-09 ENCOUNTER — Encounter: Payer: Self-pay | Admitting: Urology

## 2018-04-09 ENCOUNTER — Ambulatory Visit: Payer: Medicaid Other | Admitting: Urology

## 2018-04-09 VITALS — BP 109/68 | HR 90 | Wt 246.0 lb

## 2018-04-09 DIAGNOSIS — R109 Unspecified abdominal pain: Secondary | ICD-10-CM | POA: Diagnosis not present

## 2018-04-09 DIAGNOSIS — N200 Calculus of kidney: Secondary | ICD-10-CM | POA: Diagnosis not present

## 2018-04-09 DIAGNOSIS — Z125 Encounter for screening for malignant neoplasm of prostate: Secondary | ICD-10-CM | POA: Diagnosis not present

## 2018-04-09 LAB — COMPLIANCE DRUG ANALYSIS, UR

## 2018-04-09 MED ORDER — TAMSULOSIN HCL 0.4 MG PO CAPS
0.4000 mg | ORAL_CAPSULE | Freq: Every day | ORAL | 3 refills | Status: DC
Start: 2018-04-09 — End: 2018-05-30

## 2018-04-12 DIAGNOSIS — M5412 Radiculopathy, cervical region: Secondary | ICD-10-CM | POA: Insufficient documentation

## 2018-04-16 ENCOUNTER — Emergency Department: Payer: Medicaid Other

## 2018-04-16 ENCOUNTER — Other Ambulatory Visit: Payer: Self-pay

## 2018-04-16 ENCOUNTER — Emergency Department
Admission: EM | Admit: 2018-04-16 | Discharge: 2018-04-16 | Disposition: A | Discharge status: Home / Self Care | Payer: Medicaid Other | Attending: Emergency Medicine | Admitting: Emergency Medicine

## 2018-04-16 DIAGNOSIS — Z79899 Other long term (current) drug therapy: Secondary | ICD-10-CM | POA: Diagnosis not present

## 2018-04-16 DIAGNOSIS — J449 Chronic obstructive pulmonary disease, unspecified: Secondary | ICD-10-CM | POA: Insufficient documentation

## 2018-04-16 DIAGNOSIS — F17210 Nicotine dependence, cigarettes, uncomplicated: Secondary | ICD-10-CM | POA: Diagnosis not present

## 2018-04-16 DIAGNOSIS — Z7982 Long term (current) use of aspirin: Secondary | ICD-10-CM | POA: Diagnosis not present

## 2018-04-16 DIAGNOSIS — M6281 Muscle weakness (generalized): Secondary | ICD-10-CM | POA: Diagnosis not present

## 2018-04-16 DIAGNOSIS — I10 Essential (primary) hypertension: Secondary | ICD-10-CM | POA: Insufficient documentation

## 2018-04-16 DIAGNOSIS — R519 Headache, unspecified: Secondary | ICD-10-CM

## 2018-04-16 DIAGNOSIS — R531 Weakness: Secondary | ICD-10-CM

## 2018-04-16 DIAGNOSIS — R51 Headache: Secondary | ICD-10-CM | POA: Diagnosis not present

## 2018-04-16 LAB — CBC
HEMATOCRIT: 42.2 % (ref 40.0–52.0)
HEMOGLOBIN: 14 g/dL (ref 13.0–18.0)
MCH: 28.6 pg (ref 26.0–34.0)
MCHC: 33.2 g/dL (ref 32.0–36.0)
MCV: 86.2 fL (ref 80.0–100.0)
Platelets: 297 10*3/uL (ref 150–440)
RBC: 4.89 MIL/uL (ref 4.40–5.90)
RDW: 14.6 % — ABNORMAL HIGH (ref 11.5–14.5)
WBC: 17.9 10*3/uL — AB (ref 3.8–10.6)

## 2018-04-16 LAB — URINALYSIS, COMPLETE (UACMP) WITH MICROSCOPIC
BILIRUBIN URINE: NEGATIVE
Bacteria, UA: NONE SEEN
Glucose, UA: NEGATIVE mg/dL
Hgb urine dipstick: NEGATIVE
Ketones, ur: NEGATIVE mg/dL
LEUKOCYTES UA: NEGATIVE
Nitrite: NEGATIVE
PH: 5 (ref 5.0–8.0)
Protein, ur: NEGATIVE mg/dL
SPECIFIC GRAVITY, URINE: 1.023 (ref 1.005–1.030)
SQUAMOUS EPITHELIAL / LPF: NONE SEEN (ref 0–5)

## 2018-04-16 LAB — COMPREHENSIVE METABOLIC PANEL
ALK PHOS: 99 U/L (ref 38–126)
ALT: 29 U/L (ref 0–44)
AST: 25 U/L (ref 15–41)
Albumin: 4.2 g/dL (ref 3.5–5.0)
Anion gap: 8 (ref 5–15)
BILIRUBIN TOTAL: 0.6 mg/dL (ref 0.3–1.2)
BUN: 27 mg/dL — AB (ref 6–20)
CALCIUM: 9.1 mg/dL (ref 8.9–10.3)
CO2: 25 mmol/L (ref 22–32)
Chloride: 104 mmol/L (ref 98–111)
Creatinine, Ser: 1.12 mg/dL (ref 0.61–1.24)
GFR calc Af Amer: >60 mL/min (ref >60)
Glucose, Bld: 181 mg/dL — ABNORMAL HIGH (ref 70–99)
POTASSIUM: 4.6 mmol/L (ref 3.5–5.1)
Sodium: 137 mmol/L (ref 135–145)
TOTAL PROTEIN: 7.3 g/dL (ref 6.5–8.1)

## 2018-04-16 LAB — DIFFERENTIAL
Basophils Absolute: 0.2 10*3/uL — ABNORMAL HIGH (ref 0–0.1)
Basophils Relative: 1 %
Eosinophils Absolute: 0.3 10*3/uL (ref 0–0.7)
Eosinophils Relative: 2 %
Lymphocytes Relative: 26 %
Lymphs Abs: 4.6 10*3/uL — ABNORMAL HIGH (ref 1.0–3.6)
MONOS PCT: 8 %
Monocytes Absolute: 1.5 10*3/uL — ABNORMAL HIGH (ref 0.2–1.0)
NEUTROS ABS: 11.4 10*3/uL — AB (ref 1.4–6.5)
Neutrophils Relative %: 63 %

## 2018-04-16 LAB — APTT: aPTT: 28 seconds (ref 24–36)

## 2018-04-16 LAB — PROTIME-INR
INR: 0.91
Prothrombin Time: 12.2 seconds (ref 11.4–15.2)

## 2018-04-16 LAB — TROPONIN I

## 2018-04-16 MED ORDER — SODIUM CHLORIDE 0.9 % IV BOLUS: 500.0000 mL | Freq: Once | INTRAVENOUS | Status: DC

## 2018-04-16 MED ORDER — METOCLOPRAMIDE HCL 5 MG/ML IJ SOLN: 10.0000 mg | Freq: Once | INTRAMUSCULAR | Status: DC

## 2018-04-16 NOTE — Discharge Instructions (Addendum)
Return to the ER for new, worsening, persistent severe headache, weakness or numbness, lightheadedness, fevers, vomiting, or any other new or worsening symptoms that concern you.  Follow-up with your primary care doctor.

## 2018-04-16 NOTE — ED Triage Notes (Signed)
Pt states started last night Ha and dizziness. Pt is a/o smokes 2 packs of cigs a day. Pt VSS , pt has a slow drift on right side r/t CVA in 2015 which is baseline to him.

## 2018-04-16 NOTE — ED Provider Notes (Signed)
[facility] Emergency Department Provider Note ____________________________________________   First MD Initiated Contact with Patient 04/16/18 1523     (approximate)  I have reviewed the triage vital signs and the nursing notes.   HISTORY  Chief Complaint Headache    HPI Mark Vang is a 57 y.o. male with PMH as noted below including COPD on home O2 and prior stroke with residual right-sided deficits who presents primarily with dizziness, which he describes as lightheaded, relatively acute onset last night and persisting today.  He also states that he has not had a headache, primarily in the front of his head for about the last 1.5 months although it is not there constantly.  He states that the dizziness he is feeling today feels somewhat like he felt before his stroke several years ago.  He denies increased shortness of breath, chest pain, vomiting or diarrhea, or fever.  He does report some bilateral blurred vision.  Past Medical History:  Diagnosis Date  . COPD (chronic obstructive pulmonary disease) ([place])   . Hypertension   . Stroke [facility])     Patient Active Problem List   Diagnosis Date Noted  . Chronic pain of both lower extremities (Primary Area of Pain) (R>L) 04/02/2018  . Cervical spondylosis (Secondary Area of Pain) 04/02/2018  . Chronic pain of both hips [facility] Area of Pain) 04/02/2018  . Chronic bilateral low back pain with bilateral sciatica (Fourth Area of Pain) (R>L) 04/02/2018  . Chronic pain syndrome 04/02/2018  . Long term current use of opiate analgesic 04/02/2018  . Pharmacologic therapy 04/02/2018  . Disorder of skeletal system 04/02/2018  . Problems influencing health status 04/02/2018  . Arthritis 07/01/2016  . Seizure ([place]) 07/01/2016  . Sinusitis 07/01/2016  . Stroke ([place]) 07/01/2016  . COPD (chronic obstructive pulmonary disease) ([place]) 01/11/2013  . Dyspnea on exertion 01/11/2013  . GERD (gastroesophageal reflux  disease) 01/11/2013  . HTN (hypertension) 01/11/2013  . Seizures ([place]) 01/11/2013  . Tobacco abuse 01/11/2013  . Chronic sinusitis 12/13/2012  . 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 KNEE SURGERY    . NASAL SINUS SURGERY      Prior to Admission medications   Medication Sig Start Date End Date Taking? Authorizing Provider  amLODipine (NORVASC) 5 MG tablet Take 5 mg by mouth daily.   Yes [provider]  aspirin 325 MG EC tablet Take 325 mg by mouth daily.   Yes [provider]  atorvastatin (LIPITOR) 40 MG tablet Take 40 mg by mouth daily.   Yes [provider]  cholecalciferol (VITAMIN D) 1000 units tablet Take 1,000 Units by mouth daily.   Yes [provider]  fluticasone (FLONASE) 50 MCG/ACT nasal spray Place 1 spray into both nostrils daily.    Yes [provider]  Multiple Vitamin (MULTIVITAMIN) tablet Take 1 tablet by mouth daily.   Yes [provider]  pantoprazole (PROTONIX) 40 MG tablet Take 1 tablet by mouth daily. 04/10/18  Yes [provider]  tamsulosin (FLOMAX) 0.4 MG CAPS capsule Take 1 capsule (0.4 mg total) by mouth daily. 04/09/18  Yes McGowan, Larene [place] A, PA-C  dexlansoprazole (DEXILANT) 60 MG capsule Take 60 mg by mouth daily.    [provider]  Fluticasone-Umeclidin-Vilant (TRELEGY ELLIPTA) 100-62.5-25 MCG/INH AEPB Inhale into the lungs.    [provider]  lisinopril (PRINIVIL,ZESTRIL) 20 MG tablet Take 20 mg by mouth daily.  [provider]  Oxcarbazepine (TRILEPTAL) 300 MG tablet Take 300 mg by mouth 2 (two) times daily.     [provider]  OXYGEN Inhale into the lungs.    [provider]  potassium citrate (UROCIT-K) 10 MEQ (1080 MG) SR tablet Take 10 mEq by mouth 3 (three) times daily with meals.    [provider]  predniSONE (DELTASONE) 5 MG tablet Take by mouth.  04/04/18 04/16/18  [provider]    Allergies Amlodipine besy-benazepril hcl and Amlodipine  Family History  Problem Relation Age of Onset  . Cancer Mother   . Cancer Father   . Bladder Cancer Neg Hx   . Kidney cancer Neg Hx   . Prostate cancer Neg Hx     Social History Social History   Tobacco Use  . Smoking status: Current Every Day Smoker    Packs/day: 1.00    Types: Cigarettes  . Smokeless tobacco: Never Used  Substance Use Topics  . Alcohol use: No  . Drug use: No    Review of Systems  Constitutional: No fever. Eyes: Positive for blurred vision. ENT: No sore throat. Cardiovascular: Denies chest pain. Respiratory: Denies acute shortness of breath. Gastrointestinal: No vomiting. Genitourinary: Negative for dysuria.  Musculoskeletal: Positive for chronic back pain. Skin: Negative for rash. Neurological: Positive for headache.   ____________________________________________   PHYSICAL EXAM:  VITAL SIGNS: ED Triage Vitals  Enc Vitals Group     BP 04/16/18 1510 112/69     Pulse Rate 04/16/18 1510 91     Resp 04/16/18 1510 19     Temp 04/16/18 1510 98.6 F (37 C)     Temp Source 04/16/18 1510 Oral     SpO2 04/16/18 1510 97 %     Weight 04/16/18 1510 247 lb (112 kg)     Height 04/16/18 1510 6' (1.829 m)     Head Circumference --      Peak Flow --      Pain Score 04/16/18 1512 2     Pain Loc --      Pain Edu? --      Excl. in [place]? --     Constitutional: Alert and oriented.  Slightly uncomfortable appearing but in no acute distress. Eyes: Conjunctivae are normal.  EOMI.  PERRLA. Head: Atraumatic. Nose: No congestion/rhinnorhea. Mouth/Throat: Mucous membranes are slightly dry.   Neck: Normal range of motion.  Cardiovascular: Normal rate, regular rhythm. Grossly normal heart sounds.  Good peripheral circulation. Respiratory: Normal respiratory effort.  No retractions. Lungs CTAB. Gastrointestinal: Soft and nontender. No distention.    Genitourinary: No flank tenderness. Musculoskeletal: No lower extremity edema.  Extremities warm and well perfused.  Neurologic:  Normal speech and language.  Mild right upper extremity weakness which the patient states is baseline.  Motor and sensory are otherwise intact to all extremities.  Normal coordination with no ataxia on finger-to-nose.  No facial droop. Skin:  Skin is warm and dry. No rash noted. Psychiatric: Mood and affect are normal. Speech and behavior are normal.  ____________________________________________   LABS (all labs ordered are listed, but only abnormal results are displayed)  Labs Reviewed  CBC - Abnormal; Notable for the following components:      Result Value   WBC 17.9 (*)    RDW 14.6 (*)    All other components within normal limits  DIFFERENTIAL - Abnormal; Notable for the following components:   Neutro Abs 11.4 (*)    Lymphs Abs 4.6 (*)  Monocytes Absolute 1.5 (*)    Basophils Absolute 0.2 (*)    All other components within normal limits  COMPREHENSIVE METABOLIC PANEL - Abnormal; Notable for the following components:   Glucose, Bld 181 (*)    BUN 27 (*)    All other components within normal limits  URINALYSIS, COMPLETE (UACMP) WITH MICROSCOPIC - Abnormal; Notable for the following components:   Color, Urine YELLOW (*)    APPearance CLEAR (*)    All other components within normal limits  PROTIME-INR  APTT  TROPONIN I  CBG MONITORING, ED   ____________________________________________  EKG  ED ECG REPORT I, Arta Silence, the attending physician, personally viewed and interpreted this ECG.  Date: 04/16/2018 EKG Time: 1511 Rate: 81 Rhythm: normal sinus rhythm QRS Axis: normal Intervals: normal ST/T Wave abnormalities: normal Narrative Interpretation: no evidence of acute ischemia  ____________________________________________  RADIOLOGY  CT head: No ICH or other acute findings CXR: No focal  infiltrate  ____________________________________________   PROCEDURES  Procedure(s) performed: No  Procedures  Critical Care performed: No ____________________________________________   INITIAL IMPRESSION / ASSESSMENT AND PLAN / ED COURSE  Pertinent labs & imaging results that were available during my care of the patient were reviewed by me and considered in my medical decision making (see chart for details).  57 year old male with PMH as noted above presents with lightheadedness since last night, as well as headache over approximately the last month.  I reviewed the past medical records in Epic; the patient was most recently seen and released from the ED late last year due to body aches.  He was evaluated for a TIA in 2015.  He had a prior stroke in 2013.  On exam, the patient is relatively well-appearing.  The vital signs are normal.  The neuro exam is nonfocal except for residual right upper extremity weakness which the patient states it is his baseline.  Differential includes primarily systemic causes such as dehydration, electrolyte abnormality or other metabolic etiology, or less likely infection or cardiac cause.  However given the patient's increased stroke risk and reported similar to prior symptoms we will obtain a CT head as well.  I will treat symptomatically and give fluids.  Anticipate discharge home if negative work-up and symptoms are improved.  ----------------------------------------- 6:59 PM on 04/16/2018 -----------------------------------------  The patient's work-up has been unremarkable.  CT head shows no acute findings.  Patient does have an elevated WBC count although this may just be stress related, as he has no fever, no other infectious symptoms, and both his chest x-ray and UA are negative.  The patient is feeling much better after fluids and would like to go home.  I counseled the patient on the results of the work-up.  Return precautions given, and he  expresses understanding. ____________________________________________   FINAL CLINICAL IMPRESSION(S) / ED DIAGNOSES  Final diagnoses:  Nonintractable episodic headache, unspecified headache type  Generalized weakness      NEW MEDICATIONS STARTED DURING THIS VISIT:  New Prescriptions   No medications on file     Note:  This document was prepared using Dragon voice recognition software and may include unintentional dictation errors.    Arta Silence, MD 04/16/18 1901

## 2018-04-27 DIAGNOSIS — M1710 Unilateral primary osteoarthritis, unspecified knee: Secondary | ICD-10-CM | POA: Insufficient documentation

## 2018-04-27 DIAGNOSIS — M179 Osteoarthritis of knee, unspecified: Secondary | ICD-10-CM | POA: Insufficient documentation

## 2018-04-27 DIAGNOSIS — S83249A Other tear of medial meniscus, current injury, unspecified knee, initial encounter: Secondary | ICD-10-CM | POA: Insufficient documentation

## 2018-04-27 NOTE — Progress Notes (Signed)
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 not use drugs. Alcohol:  reports that he does not drink alcohol. Tobacco:  reports that he has been smoking cigarettes.  He has been smoking about 1.00 pack per day. He has never used smokeless tobacco. Medical:  has a past medical history of COPD (chronic obstructive pulmonary disease) ([place]), Hypertension, and Stroke ([place]). Surgical: Mark Vang  has a past surgical history that includes Knee surgery and Nasal sinus surgery. Family: family history includes Cancer in his father and mother.  Constitutional Exam  General appearance: Well nourished, well developed, and well hydrated. In no apparent acute distress Vitals:   04/30/18 1135  BP: 104/79  Pulse: 82  Resp: 16  Temp: 98.9 F (37.2 C)  TempSrc: Oral  SpO2: 97%   Weight: 252 lb (114.3 kg)  Height: 6' (1.829 m)   BMI Assessment: Estimated body mass index is 34.18 kg/m as calculated from the following:   Height as of this encounter: 6' (1.829 m).   Weight as of this encounter: 252 lb (114.3 kg).  BMI interpretation table: BMI level Category Range association with higher incidence of chronic pain  <18 kg/m2 Underweight   18.5-24.9 kg/m2 Ideal body weight   25-29.9 kg/m2 Overweight Increased incidence by 20%  30-34.9 kg/m2 Obese (Class I) Increased incidence by 68%  35-39.9 kg/m2 Severe obesity (Class II) Increased incidence by 136%  >40 kg/m2 Extreme obesity (Class III) Increased incidence by 254%   Patient's current BMI Ideal Body weight  Body mass index is 34.18 kg/m. Ideal body weight: 77.6 kg (171 lb 1.2 oz) Adjusted ideal body weight: 92.3 kg (203 lb 7.1 oz)   BMI Readings from Last 4 Encounters:  04/30/18 34.18 kg/m  04/16/18 33.50 kg/m  04/09/18 33.36 kg/m  04/02/18 34.58 kg/m   Wt Readings from Last 4 Encounters:  04/30/18 252 lb (114.3 kg)  04/16/18 247 lb (112 kg)  04/09/18 246 lb (111.6 kg)  04/02/18 255 lb (115.7 kg)  Psych/Mental status: Alert, oriented x 3 (person, place, & time)       Eyes: PERLA Respiratory: No evidence of acute respiratory distress  Cervical Spine Area Exam  Skin & Axial Inspection: No masses, redness, edema, swelling, or associated skin lesions Alignment: Symmetrical Functional ROM: Minimal ROM, bilaterally, especially on cervical extension. In fact he indicates that it is hard for him to keep his head up. Stability: No instability detected Muscle Tone/Strength: Guarding observed Sensory (Neurological): Movement-associated pain Palpation: Complains of area being tender to palpation              Upper Extremity (UE) Exam    Side: Right upper extremity  Side: Left upper extremity  Skin & Extremity Inspection: Skin color, temperature, and hair growth are WNL. No peripheral edema or cyanosis.  No masses, redness, swelling, asymmetry, or associated skin lesions. No contractures.  Skin & Extremity Inspection: Skin  color, temperature, and hair growth are WNL. No peripheral edema or cyanosis. No masses, redness, swelling, asymmetry, or associated skin lesions. No contractures.  Functional ROM: Unrestricted ROM          Functional ROM: Unrestricted ROM          Muscle Tone/Strength: Functionally intact. No obvious neuro-muscular anomalies detected.  Muscle Tone/Strength: Functionally intact. No obvious neuro-muscular anomalies detected.  Sensory (Neurological): Unimpaired          Sensory (Neurological): Unimpaired          Palpation: No palpable anomalies              Palpation: No palpable anomalies              Provocative Test(s):  Phalen's test: deferred Tinel's test: deferred Apley's scratch test (touch opposite shoulder):  Action 1 (Across chest): deferred Action 2 (Overhead): deferred Action 3 (LB reach): deferred   Provocative Test(s):  Phalen's test: deferred Tinel's test: deferred Apley's scratch test (touch opposite shoulder):  Action 1 (Across chest): deferred Action 2 (Overhead): deferred Action 3 (LB reach): deferred    Thoracic Spine Area Exam  Skin & Axial Inspection: No masses, redness, or swelling Alignment: Symmetrical Functional ROM: Unrestricted ROM Stability: No instability detected Muscle Tone/Strength: Functionally intact. No obvious neuro-muscular anomalies detected. Sensory (Neurological): Unimpaired Muscle strength & Tone: No palpable anomalies  Lumbar Spine Area Exam  Skin & Axial Inspection: No masses, redness, or swelling Alignment: Symmetrical Functional ROM: Minimal ROM affecting both sides Stability: No instability detected Muscle Tone/Strength: Increased muscle tone over affected area Sensory (Neurological): Movement-associated pain Palpation: Complains of area being tender to palpation       Provocative Tests: Hyperextension/rotation  test: (+) bilaterally for facet joint pain. Lumbar quadrant test (Kemp's test): (+) bilaterally for facet joint pain. Lateral bending test: deferred today       Patrick's Maneuver: (+) for left-sided S-I arthralgia and for right hip arthralgia FABER test: (+) for left-sided S-I arthralgia and for right hip arthralgia S-I anterior distraction/compression test: deferred today         S-I lateral compression test: deferred today         S-I Thigh-thrust test: deferred today         S-I Gaenslen's test: deferred today         Gait & Posture Assessment  Ambulation: Limited Gait: Relatively normal for age and body habitus Posture: Difficulty standing up straight, due to pain   Lower Extremity Exam    Side: Right lower extremity  Side: Left lower extremity  Stability: No instability observed          Stability: No instability observed          Skin & Extremity Inspection: Skin color, temperature, and hair growth are WNL. No peripheral edema or cyanosis. No masses, redness, swelling, asymmetry, or associated skin lesions. No contractures.  Skin & Extremity Inspection: Skin color, temperature, and hair growth are WNL. No peripheral edema or cyanosis. No masses, redness, swelling, asymmetry, or associated skin lesions. No contractures.  Functional ROM: Decreased ROM for hip and knee joints          Functional ROM: Decreased ROM for hip and knee joints          Muscle Tone/Strength: Able to Toe-walk & Heel-walk without problems  Muscle Tone/Strength: Able to Toe-walk & Heel-walk without problems  Sensory (Neurological): Unimpaired  Sensory (Neurological): Unimpaired  Palpation: No palpable anomalies  Palpation: No palpable anomalies  Assessment & Plan  Primary Diagnosis & Pertinent Problem List: The primary encounter diagnosis was Chronic neck pain (Bilateral). Diagnoses of DDD (degenerative disc disease), cervical, Other cervical disc degeneration, unspecified cervical region, Cervico-occipital  neuralgia (Bilateral), Occipital headache (Bilateral), Cervicogenic headache, Cervicalgia, Chronic low back pain (Secondary Area of Pain) (Bilateral) (R>L), DDD (degenerative disc disease), lumbosacral, Lumbar facet arthropathy (Bilateral), Lumbar facet syndrome (Bilateral), Chronic sacroiliac joint pain (Left), Chronic lower extremity pain (Primary Area of Pain) (Bilateral) (R>L), Chronic hip pain (Right), Muscle spasms of neck, Chronic pain syndrome, Opiate use, and Tobacco abuse were also pertinent to this visit.  Visit Diagnosis: 1. Chronic neck pain (Bilateral)   2. DDD (degenerative disc disease), cervical   3. Other cervical disc degeneration, unspecified cervical region   4. Cervico-occipital neuralgia (Bilateral)   5. Occipital headache (Bilateral)   6. Cervicogenic headache   7. Cervicalgia   8. Chronic low back pain (Secondary Area of Pain) (Bilateral) (R>L)   9. DDD (degenerative disc disease), lumbosacral   10. Lumbar facet arthropathy (Bilateral)   11. Lumbar facet syndrome (Bilateral)   12. Chronic sacroiliac joint pain (Left)   13. Chronic lower extremity pain (Primary Area of Pain) (Bilateral) (R>L)   14. Chronic hip pain (Right)   15. Muscle spasms of neck   16. Chronic pain syndrome   17. Opiate use   18. Tobacco abuse    Problems updated and reviewed during this visit: Problem  Chronic hip pain (Tertiary Area of Pain) (Right)  Chronic neck pain (Primary Area of Pain) (Bilateral) (L>R)  Ddd (Degenerative Disc Disease), Cervical  Muscle Spasms of Neck  Cervicogenic Headache  Occipital headache (Bilateral)  Cervico-occipital neuralgia (Bilateral)  Lumbar facet syndrome (Bilateral)  Lumbar facet arthropathy (Bilateral)  Ddd (Degenerative Disc Disease), Lumbosacral  Chronic sacroiliac joint pain (Left)  Current Tear Knee, Medial Meniscus  Osteoarthritis of Knee  Cervical Radiculopathy  Chronic lower extremity pain (Tertiary Area of Pain) (Bilateral) (R>L)   Cervical Spondylosis  Chronic low back pain (Secondary Area of Pain) (Bilateral) (R>L)  Chronic Pain Syndrome  Arthritis  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 ketorolac (TORADOL) injection 60 mg  . orphenadrine (NORFLEX) injection 60 mg  . baclofen (LIORESAL) 10 MG tablet    Sig: Take 1 tablet (10 mg total) by mouth 3 (three) times daily.    Dispense:  90 tablet    Refill:  0    Do not place this medication, or any other prescription from our practice, on "Automatic Refill". Patient may have prescription filled one day early if pharmacy is closed on scheduled refill date.  Marland Kitchen oxyCODONE (OXY IR/ROXICODONE) 5 MG immediate release tablet    Sig: Take 1 tablet (5 mg total) by mouth at bedtime.    Dispense:  30 tablet    Refill:  0    Medication for Chronic Pain (G89.4). [place] STOP ACT - Not applicable. Fill one day early if pharmacy is closed on scheduled refill date.  Do not fill until: 04/30/18 To last until: 05/30/18  Procedure Orders     Cervical Epidural Injection Lab Orders  No laboratory test(s) ordered today    Imaging Orders     MR CERVICAL SPINE WO CONTRAST  Referral Orders     Ambulatory referral to Physical Therapy Pharmacological management options:  Opioid Analgesics: We'll take over management today. See above orders Membrane stabilizer: We have discussed the possibility of optimizing this mode of therapy, if tolerated Muscle relaxant: We have discussed the  possibility of a trial NSAID: We have discussed the possibility of a trial Other analgesic(s): To be determined at a later time   Interventional management options: Planned, scheduled, and/or pending:    Diagnostic left-sided cervical epidural steroid injection #1 under fluoroscopic guidance, no sedation  IM Toradol/Norflex 60/60 today.  Referral to physical therapy for cervical traction  Cervical MRI    Considering:   Diagnostic bilateral lumbar epidural steroid injection  Diagnostic bilateral lumbar facet nerve block  Possible bilateral lumbar facet radiofrequency ablation  Diagnostic bilateral cervical facet nerve block  Possible bilateral cervical facet frequency ablation  Diagnostic trigger point injections  Diagnostic bilateral intra-articular hip injections  Diagnostic greater occipital nerve block  Diagnostic bilateral articular thumb injections    PRN Procedures:   None at this time   Provider-requested follow-up: Return for Procedure (no sedation): (L) CESI #1.  Future Appointments  Date Time Provider Parole  04/15/2019  1:45 PM McGowan, Marlowe Kays None    Primary Care Physician: Remi Haggard, FNP Location: [facility] Note by: Gaspar Cola, MD Date: 04/30/2018; Time: 1:14 PM

## 2018-04-30 ENCOUNTER — Other Ambulatory Visit: Payer: Self-pay

## 2018-04-30 ENCOUNTER — Ambulatory Visit: Payer: Medicaid Other | Attending: Pain Medicine | Admitting: Pain Medicine

## 2018-04-30 ENCOUNTER — Encounter: Payer: Self-pay | Admitting: Pain Medicine

## 2018-04-30 VITALS — BP 104/79 | HR 82 | Temp 98.9°F | Resp 16 | Ht 72.0 in | Wt 252.0 lb

## 2018-04-30 DIAGNOSIS — M179 Osteoarthritis of knee, unspecified: Secondary | ICD-10-CM | POA: Diagnosis not present

## 2018-04-30 DIAGNOSIS — F1190 Opioid use, unspecified, uncomplicated: Secondary | ICD-10-CM

## 2018-04-30 DIAGNOSIS — M25551 Pain in right hip: Secondary | ICD-10-CM | POA: Diagnosis not present

## 2018-04-30 DIAGNOSIS — Z720 Tobacco use: Secondary | ICD-10-CM

## 2018-04-30 DIAGNOSIS — M5010 Cervical disc disorder with radiculopathy, unspecified cervical region: Secondary | ICD-10-CM | POA: Diagnosis not present

## 2018-04-30 DIAGNOSIS — M79604 Pain in right leg: Secondary | ICD-10-CM

## 2018-04-30 DIAGNOSIS — J329 Chronic sinusitis, unspecified: Secondary | ICD-10-CM | POA: Diagnosis not present

## 2018-04-30 DIAGNOSIS — G894 Chronic pain syndrome: Secondary | ICD-10-CM | POA: Diagnosis not present

## 2018-04-30 DIAGNOSIS — R519 Headache, unspecified: Secondary | ICD-10-CM

## 2018-04-30 DIAGNOSIS — Z7951 Long term (current) use of inhaled steroids: Secondary | ICD-10-CM | POA: Insufficient documentation

## 2018-04-30 DIAGNOSIS — M5030 Other cervical disc degeneration, unspecified cervical region: Secondary | ICD-10-CM

## 2018-04-30 DIAGNOSIS — J449 Chronic obstructive pulmonary disease, unspecified: Secondary | ICD-10-CM | POA: Insufficient documentation

## 2018-04-30 DIAGNOSIS — R569 Unspecified convulsions: Secondary | ICD-10-CM | POA: Insufficient documentation

## 2018-04-30 DIAGNOSIS — M549 Dorsalgia, unspecified: Secondary | ICD-10-CM | POA: Diagnosis present

## 2018-04-30 DIAGNOSIS — M79605 Pain in left leg: Secondary | ICD-10-CM

## 2018-04-30 DIAGNOSIS — M4722 Other spondylosis with radiculopathy, cervical region: Secondary | ICD-10-CM | POA: Diagnosis not present

## 2018-04-30 DIAGNOSIS — M5481 Occipital neuralgia: Secondary | ICD-10-CM | POA: Diagnosis not present

## 2018-04-30 DIAGNOSIS — I10 Essential (primary) hypertension: Secondary | ICD-10-CM | POA: Diagnosis not present

## 2018-04-30 DIAGNOSIS — M545 Low back pain, unspecified: Secondary | ICD-10-CM

## 2018-04-30 DIAGNOSIS — R51 Headache: Secondary | ICD-10-CM | POA: Diagnosis not present

## 2018-04-30 DIAGNOSIS — M47816 Spondylosis without myelopathy or radiculopathy, lumbar region: Secondary | ICD-10-CM

## 2018-04-30 DIAGNOSIS — M542 Cervicalgia: Secondary | ICD-10-CM

## 2018-04-30 DIAGNOSIS — M62838 Other muscle spasm: Secondary | ICD-10-CM | POA: Insufficient documentation

## 2018-04-30 DIAGNOSIS — G4486 Cervicogenic headache: Secondary | ICD-10-CM

## 2018-04-30 DIAGNOSIS — G8929 Other chronic pain: Secondary | ICD-10-CM | POA: Insufficient documentation

## 2018-04-30 DIAGNOSIS — Z8673 Personal history of transient ischemic attack (TIA), and cerebral infarction without residual deficits: Secondary | ICD-10-CM | POA: Diagnosis not present

## 2018-04-30 DIAGNOSIS — Z79891 Long term (current) use of opiate analgesic: Secondary | ICD-10-CM | POA: Diagnosis not present

## 2018-04-30 DIAGNOSIS — M533 Sacrococcygeal disorders, not elsewhere classified: Secondary | ICD-10-CM | POA: Insufficient documentation

## 2018-04-30 DIAGNOSIS — M5137 Other intervertebral disc degeneration, lumbosacral region: Secondary | ICD-10-CM | POA: Insufficient documentation

## 2018-04-30 DIAGNOSIS — K219 Gastro-esophageal reflux disease without esophagitis: Secondary | ICD-10-CM | POA: Diagnosis not present

## 2018-04-30 DIAGNOSIS — Z888 Allergy status to other drugs, medicaments and biological substances status: Secondary | ICD-10-CM | POA: Insufficient documentation

## 2018-04-30 DIAGNOSIS — F17210 Nicotine dependence, cigarettes, uncomplicated: Secondary | ICD-10-CM | POA: Diagnosis not present

## 2018-04-30 DIAGNOSIS — Z789 Other specified health status: Secondary | ICD-10-CM | POA: Insufficient documentation

## 2018-04-30 DIAGNOSIS — Z79899 Other long term (current) drug therapy: Secondary | ICD-10-CM | POA: Insufficient documentation

## 2018-04-30 DIAGNOSIS — Z7982 Long term (current) use of aspirin: Secondary | ICD-10-CM | POA: Insufficient documentation

## 2018-04-30 MED ORDER — KETOROLAC TROMETHAMINE 60 MG/2ML IM SOLN
60.0000 mg | Freq: Once | INTRAMUSCULAR | Status: AC
  Administered 2018-04-30: 60 mg via INTRAMUSCULAR
  Filled 2018-04-30: qty 2

## 2018-04-30 MED ORDER — ORPHENADRINE CITRATE 30 MG/ML IJ SOLN
60.0000 mg | Freq: Once | INTRAMUSCULAR | Status: AC
  Administered 2018-04-30: 60 mg via INTRAMUSCULAR
  Filled 2018-04-30: qty 2

## 2018-04-30 MED ORDER — BACLOFEN 10 MG PO TABS: 10.0000 mg | ORAL_TABLET | Freq: Three times a day (TID) | ORAL | 0 refills | Status: DC

## 2018-04-30 MED ORDER — OXYCODONE HCL 5 MG PO TABS: 5.0000 mg | ORAL_TABLET | Freq: Every day | ORAL | 0 refills | Status: DC

## 2018-04-30 NOTE — Patient Instructions (Addendum)
____________________________________________________________________________________________  Pain Scale  Introduction: The pain score used by this practice is the Verbal Numerical Rating Scale (VNRS-11). This is an 11-point scale. It is for adults and children 10 years or older. There are significant differences in how the pain score is reported, used, and applied. Forget everything you learned in the past and learn this scoring system.  General Information: The scale should reflect your current level of pain. Unless you are specifically asked for the level of your worst pain, or your average pain. If you are asked for one of these two, then it should be understood that it is over the past 24 hours.  Basic Activities of Daily Living (ADL): Personal hygiene, dressing, eating, transferring, and using restroom.  Instructions: Most patients tend to report their level of pain as a combination of two factors, their physical pain and their psychosocial pain. This last one is also known as "suffering" and it is reflection of how physical pain affects you socially and psychologically. From now on, report them separately. From this point on, when asked to report your pain level, report only your physical pain. Use the following table for reference.  Pain Clinic Pain Levels (0-5/10)  Pain Level Score  Description  No Pain 0   Mild pain 1 Nagging, annoying, but does not interfere with basic activities of daily living (ADL). Patients are able to eat, bathe, get dressed, toileting (being able to get on and off the toilet and perform personal hygiene functions), transfer (move in and out of bed or a chair without assistance), and maintain continence (able to control bladder and bowel functions). Blood pressure and heart rate are unaffected. A normal heart rate for a healthy adult ranges from 60 to 100 bpm (beats per minute).   Mild to moderate pain 2 Noticeable and distracting. Impossible to hide from other  people. More frequent flare-ups. Still possible to adapt and function close to normal. It can be very annoying and may have occasional stronger flare-ups. With discipline, patients may get used to it and adapt.   Moderate pain 3 Interferes significantly with activities of daily living (ADL). It becomes difficult to feed, bathe, get dressed, get on and off the toilet or to perform personal hygiene functions. Difficult to get in and out of bed or a chair without assistance. Very distracting. With effort, it can be ignored when deeply involved in activities.   Moderately severe pain 4 Impossible to ignore for more than a few minutes. With effort, patients may still be able to manage work or participate in some social activities. Very difficult to concentrate. Signs of autonomic nervous system discharge are evident: dilated pupils (mydriasis); mild sweating (diaphoresis); sleep interference. Heart rate becomes elevated (>115 bpm). Diastolic blood pressure (lower number) rises above 100 mmHg. Patients find relief in laying down and not moving.   Severe pain 5 Intense and extremely unpleasant. Associated with frowning face and frequent crying. Pain overwhelms the senses.  Ability to do any activity or maintain social relationships becomes significantly limited. Conversation becomes difficult. Pacing back and forth is common, as getting into a comfortable position is nearly impossible. Pain wakes you up from deep sleep. Physical signs will be obvious: pupillary dilation; increased sweating; goosebumps; brisk reflexes; cold, clammy hands and feet; nausea, vomiting or dry heaves; loss of appetite; significant sleep disturbance with inability to fall asleep or to remain asleep. When persistent, significant weight loss is observed due to the complete loss of appetite and sleep deprivation.  Blood   pressure and heart rate becomes significantly elevated. Caution: If elevated blood pressure triggers a pounding headache  associated with blurred vision, then the patient should immediately seek attention at an urgent or emergency care unit, as these may be signs of an impending stroke.    Emergency Department Pain Levels (6-10/10)  Emergency Room Pain 6 Severely limiting. Requires emergency care and should not be seen or managed at an outpatient pain management facility. Communication becomes difficult and requires great effort. Assistance to reach the emergency department may be required. Facial flushing and profuse sweating along with potentially dangerous increases in heart rate and blood pressure will be evident.   Distressing pain 7 Self-care is very difficult. Assistance is required to transport, or use restroom. Assistance to reach the emergency department will be required. Tasks requiring coordination, such as bathing and getting dressed become very difficult.   Disabling pain 8 Self-care is no longer possible. At this level, pain is disabling. The individual is unable to do even the most "basic" activities such as walking, eating, bathing, dressing, transferring to a bed, or toileting. Fine motor skills are lost. It is difficult to think clearly.   Incapacitating pain 9 Pain becomes incapacitating. Thought processing is no longer possible. Difficult to remember your own name. Control of movement and coordination are lost.   The worst pain imaginable 10 At this level, most patients pass out from pain. When this level is reached, collapse of the autonomic nervous system occurs, leading to a sudden drop in blood pressure and heart rate. This in turn results in a temporary and dramatic drop in blood flow to the brain, leading to a loss of consciousness. Fainting is one of the body's self defense mechanisms. Passing out puts the brain in a calmed state and causes it to shut down for a while, in order to begin the healing process.    Summary: 1. Refer to this scale when providing [place] with your pain level. 2. Be  accurate and careful when reporting your pain level. This will help with your care. 3. Over-reporting your pain level will lead to loss of credibility. 4. Even a level of 1/10 means that there is pain and will be treated at our facility. 5. High, inaccurate reporting will be documented as "Symptom Exaggeration", leading to loss of credibility and suspicions of possible secondary gains such as obtaining more narcotics, or wanting to appear disabled, for fraudulent reasons. 6. Only pain levels of 5 or below will be seen at our facility. 7. Pain levels of 6 and above will be sent to the Emergency Department and the appointment cancelled. ____________________________________________________________________________________________   ____________________________________________________________________________________________  Muscle Spasms & Cramps  Cause:  The most common cause of muscle spasms and cramps is vitamin and/or electrolyte (calcium, potassium, sodium, etc.) deficiencies.  Possible triggers: Sweating - causes loss of electrolytes thru the skin. Steroids - causes loss of electrolytes thru the urine.  Treatment: 1. Gatorade (or any other electrolyte-replenishing drink) - Take 1, 8 oz glass with each meal (3 times a day). 2. OTC (over-the-counter) Magnesium 400 to 500 mg - Take 1 tablet twice a day (one with breakfast and one before bedtime). If you have kidney problems, talk to your primary care physician before taking any Magnesium. 3. Tonic Water with quinine - Take 1, 8 oz glass before bedtime.   ____________________________________________________________________________________________   ____________________________________________________________________________________________  Medication Rules  Applies to: All patients receiving prescriptions (written or electronic).  Pharmacy of record: Pharmacy where electronic prescriptions will be  sent. If written prescriptions are  taken to a different pharmacy, please inform the nursing staff. The pharmacy listed in the electronic medical record should be the one where you would like electronic prescriptions to be sent.  Prescription refills: Only during scheduled appointments. Applies to both, written and electronic prescriptions.  NOTE: The following applies primarily to controlled substances (Opioid* Pain Medications).   Patient's responsibilities: 1. Pain Pills: Bring all pain pills to every appointment (except for procedure appointments). 2. Pill Bottles: Bring pills in original pharmacy bottle. Always bring newest bottle. Bring bottle, even if empty. 3. Medication refills: You are responsible for knowing and keeping track of what medications you need refilled. The day before your appointment, write a list of all prescriptions that need to be refilled. Bring that list to your appointment and give it to the admitting nurse. Prescriptions will be written only during appointments. If you forget a medication, it will not be "Called in", "Faxed", or "electronically sent". You will need to get another appointment to get these prescribed. 4. Prescription Accuracy: You are responsible for carefully inspecting your prescriptions before leaving our office. Have the discharge nurse carefully go over each prescription with you, before taking them home. Make sure that your name is accurately spelled, that your address is correct. Check the name and dose of your medication to make sure it is accurate. Check the number of pills, and the written instructions to make sure they are clear and accurate. Make sure that you are given enough medication to last until your next medication refill appointment. 5. Taking Medication: Take medication as prescribed. Never take more pills than instructed. Never take medication more frequently than prescribed. Taking less pills or less frequently is permitted and encouraged, when it comes to controlled  substances (written prescriptions).  6. Inform other Doctors: Always inform, all of your healthcare providers, of all the medications you take. 7. Pain Medication from other Providers: You are not allowed to accept any additional pain medication from any other Doctor or Healthcare provider. There are two exceptions to this rule. (see below) In the event that you require additional pain medication, you are responsible for notifying us, as stated below. 8. Medication Agreement: You are responsible for carefully reading and following our Medication Agreement. This must be signed before receiving any prescriptions from our practice. Safely store a copy of your signed Agreement. Violations to the Agreement will result in no further prescriptions. (Additional copies of our Medication Agreement are available upon request.) 9. Laws, Rules, & Regulations: All patients are expected to follow all Federal and [employer], [employer], Rules, [employer]. Ignorance of the Laws does not constitute a valid excuse. The use of any illegal substances is prohibited. 10. Adopted CDC guidelines & recommendations: Target dosing levels will be at or below 60 MME/day. Use of benzodiazepines** is not recommended.  Exceptions: There are only two exceptions to the rule of not receiving pain medications from other Healthcare Providers. 1. Exception #1 (Emergencies): In the event of an emergency (i.e.: accident requiring emergency care), you are allowed to receive additional pain medication. However, you are responsible for: As soon as you are able, call our office [phone], at any time of the day or night, and leave a message stating your name, the date and nature of the emergency, and the name and dose of the medication prescribed. In the event that your call is answered by a member of our staff, make sure to document and save the date, time,  and the name of the person that took your information.  2. Exception #2 (Planned  Surgery): In the event that you are scheduled by another doctor or dentist to have any type of surgery or procedure, you are allowed (for a period no longer than 30 days), to receive additional pain medication, for the acute post-op pain. However, in this case, you are responsible for picking up a copy of our "Post-op Pain Management for Surgeons" handout, and giving it to your surgeon or dentist. This document is available at our office, and does not require an appointment to obtain it. Simply go to our office during business hours (Monday-Thursday from 8:00 AM to 4:00 PM) (Friday 8:00 AM to 12:00 Noon) or if you have a scheduled appointment with [place], prior to your surgery, and ask for it by name. In addition, you will need to provide [place] with your name, name of your surgeon, type of surgery, and date of procedure or surgery.  *Opioid medications include: morphine, codeine, oxycodone, oxymorphone, hydrocodone, hydromorphone, meperidine, tramadol, tapentadol, buprenorphine, fentanyl, methadone. **Benzodiazepine medications include: diazepam (Valium), alprazolam (Xanax), clonazepam (Klonopine), lorazepam (Ativan), clorazepate (Tranxene), chlordiazepoxide (Librium), estazolam (Prosom), oxazepam (Serax), temazepam (Restoril), triazolam (Halcion) (Last updated: 11/23/2017) ____________________________________________________________________________________________   ____________________________________________________________________________________________  Medication Recommendations and Reminders  Applies to: All patients receiving prescriptions (written and/or electronic).  Medication Rules & Regulations: These rules and regulations exist for your safety and that of others. They are not flexible and neither are we. Dismissing or ignoring them will be considered "non-compliance" with medication therapy, resulting in complete and irreversible termination of such therapy. (See document titled "Medication  Rules" for more details.) In all conscience, because of safety reasons, we cannot continue providing a therapy where the patient does not follow instructions.  Pharmacy of record:   Definition: This is the pharmacy where your electronic prescriptions will be sent.   We do not endorse any particular pharmacy.  You are not restricted in your choice of pharmacy.  The pharmacy listed in the electronic medical record should be the one where you want electronic prescriptions to be sent.  If you choose to change pharmacy, simply notify our nursing staff of your choice of new pharmacy.  Recommendations:  Keep all of your pain medications in a safe place, under lock and key, even if you live alone.   After you fill your prescription, take 1 week's worth of pills and put them away in a safe place. You should keep a separate, properly labeled bottle for this purpose. The remainder should be kept in the original bottle. Use this as your primary supply, until it runs out. Once it's gone, then you know that you have 1 week's worth of medicine, and it is time to come in for a prescription refill. If you do this correctly, it is unlikely that you will ever run out of medicine.  To make sure that the above recommendation works, it is very important that you make sure your medication refill appointments are scheduled at least 1 week before you run out of medicine. To do this in an effective manner, make sure that you do not leave the office without scheduling your next medication management appointment. Always ask the nursing staff to show you in your prescription , when your medication will be running out. Then arrange for the receptionist to get you a return appointment, at least 7 days before you run out of medicine. Do not wait until you have 1 or 2 pills left, to come  in. This is very poor planning and does not take into consideration that we may need to cancel appointments due to bad weather, sickness, or  emergencies affecting our staff.  "Partial Fill": If for any reason your pharmacy does not have enough pills/tablets to completely fill or refill your prescription, do not allow for a "partial fill". You will need a separate prescription to fill the remaining amount, which we will not provide. If the reason for the partial fill is your insurance, you will need to talk to the pharmacist about payment alternatives for the remaining tablets, but again, do not accept a partial fill.  Prescription refills and/or changes in medication(s):   Prescription refills, and/or changes in dose or medication, will be conducted only during scheduled medication management appointments. (Applies to both, written and electronic prescriptions.)  No refills on procedure days. No medication will be changed or started on procedure days. No changes, adjustments, and/or refills will be conducted on a procedure day. Doing so will interfere with the diagnostic portion of the procedure.  No phone refills. No medications will be "called into the pharmacy".  No Fax refills.  No weekend refills.  No Holliday refills.  No after hours refills.  Remember:  Business hours are:  Monday to Thursday 8:00 AM to 4:00 PM Provider's Schedule: Dionisio David, NP - Appointments are:  Medication management: Monday to Thursday 8:00 AM to 4:00 PM Milinda Pointer, MD - Appointments are:  Medication management: Monday and Wednesday 8:00 AM to 4:00 PM Procedure day: Tuesday and Thursday 7:30 AM to 4:00 PM Gillis Santa, MD - Appointments are:  Medication management: Tuesday and Thursday 8:00 AM to 4:00 PM Procedure day: Monday and Wednesday 7:30 AM to 4:00 PM (Last update: 11/23/2017) ____________________________________________________________________________________________   ____________________________________________________________________________________________  CANNABIDIOL (AKA: CBD Oil or Pills)  Applies to: All  patients receiving prescriptions of controlled substances (written and/or electronic).  General Information: Cannabidiol (CBD) was discovered in [birth year]. It is one of some 113 identified cannabinoids in cannabis (Marijuana) plants, accounting for up to 40% of the plant's extract. As of 2018, preliminary clinical research on cannabidiol included studies of anxiety, cognition, movement disorders, and pain.  Cannabidiol is consummed in multiple ways, including inhalation of cannabis smoke or vapor, as an aerosol spray into the cheek, and by mouth. It may be supplied as CBD oil containing CBD as the active ingredient (no added tetrahydrocannabinol (THC) or terpenes), a full-plant CBD-dominant hemp extract oil, capsules, dried cannabis, or as a liquid solution. CBD is thought not have the same psychoactivity as THC, and may affect the actions of THC. Studies suggest that CBD may interact with different biological targets, including cannabinoid receptors and other neurotransmitter receptors. As of 2018 the mechanism of action for its biological effects has not been determined.  In the [place], cannabidiol has a limited approval by the Food and Drug Administration (FDA) for treatment of only two types of epilepsy disorders. The side effects of long-term use of the drug include somnolence, decreased appetite, diarrhea, fatigue, malaise, weakness, sleeping problems, and others.  CBD remains a Schedule I drug prohibited for any use.  Legality: Some manufacturers ship CBD products nationally, an illegal action which the FDA has not enforced in 2018, with CBD remaining the subject of an FDA investigational new drug evaluation, and is not considered legal as a dietary supplement or food ingredient as of December 2018. Federal illegality has made it difficult historically to conduct research on CBD. CBD is openly sold in head  shops and health food stores in some states where such sales have not been explicitly  legalized.  Warning: Because it is not FDA approved for general use or treatment of pain, it is not required to undergo the same manufacturing controls as prescription drugs.  This means that the available cannabidiol (CBD) may be contaminated with THC.  If this is the case, it will trigger a positive urine drug screen (UDS) test for cannabinoids (Marijuana).  Because a positive UDS for illicit substances is a violation of our medication agreement, your opioid analgesics (pain medicine) may be permanently discontinued. (Last update: 12/14/2017) ____________________________________________________________________________________________   ____________________________________________________________________________________________  General Risks and Possible Complications  Patient Responsibilities: It is important that you read this as it is part of your informed consent. It is our duty to inform you of the risks and possible complications associated with treatments offered to you. It is your responsibility as a patient to read this and to ask questions about anything that is not clear or that you believe was not covered in this document.  Patient's Rights: You have the right to refuse treatment. You also have the right to change your mind, even after initially having agreed to have the treatment done. However, under this last option, if you wait until the last second to change your mind, you may be charged for the materials used up to that point.  Introduction: Medicine is not an Chief Strategy Officer. Everything in Medicine, including the lack of treatment(s), carries the potential for danger, harm, or loss (which is by definition: Risk). In Medicine, a complication is a secondary problem, condition, or disease that can aggravate an already existing one. All treatments carry the risk of possible complications. The fact that a side effects or complications occurs, does not imply that the treatment was  conducted incorrectly. It must be clearly understood that these can happen even when everything is done following the highest safety standards.  No treatment: You can choose not to proceed with the proposed treatment alternative. The "PRO(s)" would include: avoiding the risk of complications associated with the therapy. The "CON(s)" would include: not getting any of the treatment benefits. These benefits fall under one of three categories: diagnostic; therapeutic; and/or palliative. Diagnostic benefits include: getting information which can ultimately lead to improvement of the disease or symptom(s). Therapeutic benefits are those associated with the successful treatment of the disease. Finally, palliative benefits are those related to the decrease of the primary symptoms, without necessarily curing the condition (example: decreasing the pain from a flare-up of a chronic condition, such as incurable terminal cancer).  General Risks and Complications: These are associated to most interventional treatments. They can occur alone, or in combination. They fall under one of the following six (6) categories: no benefit or worsening of symptoms; bleeding; infection; nerve damage; allergic reactions; and/or death. 1. No benefits or worsening of symptoms: In Medicine there are no guarantees, only probabilities. No healthcare provider can ever guarantee that a medical treatment will work, they can only state the probability that it may. Furthermore, there is always the possibility that the condition may worsen, either directly, or indirectly, as a consequence of the treatment. 2. Bleeding: This is more common if the patient is taking a blood thinner, either prescription or over the counter (example: Goody Powders, Fish oil, Aspirin, Garlic, etc.), or if suffering a condition associated with impaired coagulation (example: Hemophilia, cirrhosis of the liver, low platelet counts, etc.). However, even if you do not have one  on  these, it can still happen. If you have any of these conditions, or take one of these drugs, make sure to notify your treating physician. 3. Infection: This is more common in patients with a compromised immune system, either due to disease (example: diabetes, cancer, human immunodeficiency virus [HIV], etc.), or due to medications or treatments (example: therapies used to treat cancer and rheumatological diseases). However, even if you do not have one on these, it can still happen. If you have any of these conditions, or take one of these drugs, make sure to notify your treating physician. 4. Nerve Damage: This is more common when the treatment is an invasive one, but it can also happen with the use of medications, such as those used in the treatment of cancer. The damage can occur to small secondary nerves, or to large primary ones, such as those in the spinal cord and brain. This damage may be temporary or permanent and it may lead to impairments that can range from temporary numbness to permanent paralysis and/or brain death. 5. Allergic Reactions: Any time a substance or material comes in contact with our body, there is the possibility of an allergic reaction. These can range from a mild skin rash (contact dermatitis) to a severe systemic reaction (anaphylactic reaction), which can result in death. 6. Death: In general, any medical intervention can result in death, most of the time due to an unforeseen complication. ____________________________________________________________________________________________  Epidural Steroid Injection Patient Information  Description: The epidural space surrounds the nerves as they exit the spinal cord.  In some patients, the nerves can be compressed and inflamed by a bulging disc or a tight spinal canal (spinal stenosis).  By injecting steroids into the epidural space, we can bring irritated nerves into direct contact with a potentially helpful medication.  These  steroids act directly on the irritated nerves and can reduce swelling and inflammation which often leads to decreased pain.  Epidural steroids may be injected anywhere along the spine and from the neck to the low back depending upon the location of your pain.   After numbing the skin with local anesthetic (like Novocaine), a small needle is passed into the epidural space slowly.  You may experience a sensation of pressure while this is being done.  The entire block usually last less than 10 minutes.  Conditions which may be treated by epidural steroids:   Low back and leg pain  Neck and arm pain  Spinal stenosis  Post-laminectomy syndrome  Herpes zoster (shingles) pain  Pain from compression fractures  Preparation for the injection:  3. Do not eat any solid food or dairy products within 8 hours of your appointment.  4. You may drink clear liquids up to 3 hours before appointment.  Clear liquids include water, black coffee, juice or soda.  No milk or cream please. 5. You may take your regular medication, including pain medications, with a sip of water before your appointment  Diabetics should hold regular insulin (if taken separately) and take 1/2 normal NPH dos the morning of the procedure.  Carry some sugar containing items with you to your appointment. 6. A driver must accompany you and be prepared to drive you home after your procedure.  7. Bring all your current medications with your. 8. An IV may be inserted and sedation may be given at the discretion of the physician.   9. A blood pressure cuff, EKG and other monitors will often be applied during the procedure.  Some patients may  need to have extra oxygen administered for a short period. 10. You will be asked to provide medical information, including your allergies, prior to the procedure.  We must know immediately if you are taking blood thinners (like Coumadin/Warfarin)  Or if you are allergic to IV iodine contrast (dye). We must  know if you could possible be pregnant.  Possible side-effects:  Bleeding from needle site  Infection (rare, may require surgery)  Nerve injury (rare)  Numbness & tingling (temporary)  Difficulty urinating (rare, temporary)  Spinal headache ( a headache worse with upright posture)  Light -headedness (temporary)  Pain at injection site (several days)  Decreased blood pressure (temporary)  Weakness in arm/leg (temporary)  Pressure sensation in back/neck (temporary)  Call if you experience:  Fever/chills associated with headache or increased back/neck pain.  Headache worsened by an upright position.  New onset weakness or numbness of an extremity below the injection site  Hives or difficulty breathing (go to the emergency room)  Inflammation or drainage at the infection site  Severe back/neck pain  Any new symptoms which are concerning to you  Please note:  Although the local anesthetic injected can often make your back or neck feel good for several hours after the injection, the pain will likely return.  It takes 3-7 days for steroids to work in the epidural space.  You may not notice any pain relief for at least that one week.  If effective, we will often do a series of three injections spaced 3-6 weeks apart to maximally decrease your pain.  After the initial series, we generally will wait several months before considering a repeat injection of the same type.  If you have any questions, please call [phone] [place] were given an injection into the left and right hips 1.  Toradol 60 mg IM Left hip 2.  Noflex 60 mg IM right hip    Steps to Quit Smoking Smoking tobacco can be bad for your health. It can also affect almost every organ in your body. Smoking puts you and people around you at risk for many serious long-lasting (chronic) diseases. Quitting smoking is hard, but it is one of the best things that you can do  for your health. It is never too late to quit. What are the benefits of quitting smoking? When you quit smoking, you lower your risk for getting serious diseases and conditions. They can include:  Lung cancer or lung disease.  Heart disease.  Stroke.  Heart attack.  Not being able to have children (infertility).  Weak bones (osteoporosis) and broken bones (fractures).  If you have coughing, wheezing, and shortness of breath, those symptoms may get better when you quit. You may also get sick less often. If you are pregnant, quitting smoking can help to lower your chances of having a baby of low birth weight. What can I do to help me quit smoking? Talk with your doctor about what can help you quit smoking. Some things you can do (strategies) include:  Quitting smoking totally, instead of slowly cutting back how much you smoke over a period of time.  Going to in-person counseling. You are more likely to quit if you go to many counseling sessions.  Using resources and support systems, such as: ? Database administrator with a Social worker. ? Phone quitlines. ? Careers information officer. ? Support groups or group counseling. ? Text messaging programs. ? Mobile phone apps or applications.  Taking medicines.  Some of these medicines may have nicotine in them. If you are pregnant or breastfeeding, do not take any medicines to quit smoking unless your doctor says it is okay. Talk with your doctor about counseling or other things that can help you.  Talk with your doctor about using more than one strategy at the same time, such as taking medicines while you are also going to in-person counseling. This can help make quitting easier. What things can I do to make it easier to quit? Quitting smoking might feel very hard at first, but there is a lot that you can do to make it easier. Take these steps:  Talk to your family and friends. Ask them to support and encourage you.  Call phone quitlines, reach  out to support groups, or work with a Social worker.  Ask people who smoke to not smoke around you.  Avoid places that make you want (trigger) to smoke, such as: ? Bars. ? Parties. ? Smoke-break areas at work.  Spend time with people who do not smoke.  Lower the stress in your life. Stress can make you want to smoke. Try these things to help your stress: ? Getting regular exercise. ? Deep-breathing exercises. ? Yoga. ? Meditating. ? Doing a body scan. To do this, close your eyes, focus on one area of your body at a time from head to toe, and notice which parts of your body are tense. Try to relax the muscles in those areas.  Download or buy apps on your mobile phone or tablet that can help you stick to your quit plan. There are many free apps, such as QuitGuide from the [employer] Office manager for Disease Control and Prevention). You can find more support from smokefree.gov and other websites.  This information is not intended to replace advice given to you by your health care provider. Make sure you discuss any questions you have with your health care provider. Document Released: 07/09/2009 Document Revised: 05/10/2016 Document Reviewed: 01/27/2015 Elsevier Interactive Patient Education  2018 [employer].

## 2018-04-30 NOTE — Progress Notes (Signed)
Safety precautions to be maintained throughout the outpatient stay will include: orient to surroundings, keep bed in low position, maintain call bell within reach at all times, provide assistance with transfer out of bed and ambulation.  

## 2018-05-03 ENCOUNTER — Telehealth: Payer: Self-pay | Admitting: *Deleted

## 2018-05-03 DIAGNOSIS — R51 Headache: Secondary | ICD-10-CM

## 2018-05-03 DIAGNOSIS — R519 Headache, unspecified: Secondary | ICD-10-CM | POA: Insufficient documentation

## 2018-05-09 DIAGNOSIS — M4722 Other spondylosis with radiculopathy, cervical region: Secondary | ICD-10-CM | POA: Insufficient documentation

## 2018-05-09 NOTE — Progress Notes (Signed)
Patient's Name: Mark Vang  MRN: [id number]  Referring Provider: Remi Haggard, FNP  DOB: 1961/05/09  PCP: Remi Haggard, FNP  DOS: 05/10/2018  Note by: Gaspar Cola, MD  Service setting: Ambulatory outpatient  Specialty: Interventional Pain Management  Patient type: Established  Location: [facility] (AMB) Pain Management Facility  Visit type: Interventional Procedure   Primary Reason for Visit: Interventional Pain Management Treatment. CC: Neck Pain  Procedure #1:  Anesthesia, Analgesia, Anxiolysis:  Type: Diagnostic, Inter-Laminar, Epidural Steroid Injection #1  Region: Posterior Cervico-thoracic Region Level: C7-T1 Laterality: Left-Sided Paramedial  Type: Local Anesthesia Indication(s): Analgesia         Route: Infiltration ([place]/IM) IV Access: Declined Sedation: Declined  Local Anesthetic: Lidocaine 1-2%   Indications: 1. DDD (degenerative disc disease), cervical   2. Chronic neck pain (Primary Area of Pain) (Bilateral) (L>R)   3. Cervical radiculopathy   4. Osteoarthritis of spine with radiculopathy, cervical region    Procedure #2:  Anesthesia, Analgesia, Anxiolysis:  Type: Trigger Point Injection (1-2 muscle groups) CPT: 20552 Primary Purpose: Diagnostic Region: Posterior Cervical Level: Cervical Target Area: Trigger Point Approach: Percutaneous, ipsilateral approach. Laterality: Right-Sided Paramedial Position: Prone  Type: Local Anesthesia Indication(s): Analgesia         Local Anesthetic: Lidocaine 1-2% Route: Infiltration ([place]/IM) IV Access: Declined Sedation: Declined    Indications: 1. Trigger point of neck   2. Muscle spasms of neck   3. Chronic musculoskeletal pain   4. Chronic neck pain (Primary Area of Pain) (Bilateral) (L>R)    Pain Score: Pre-procedure: 9 /10 Post-procedure: 9 /10  Pre-op Assessment:  Mr. Baeten is a 57 y.o. (year old), male patient, seen today for interventional treatment. He  has a past surgical history that includes  Knee surgery and Nasal sinus surgery. Mr. Ginley has a current medication list which includes the following prescription(s): amlodipine, aspirin, atorvastatin, baclofen, cholecalciferol, dexlansoprazole, fluticasone, fluticasone-umeclidin-vilant, lisinopril, multivitamin, oxcarbazepine, oxycodone, oxygen-helium, pantoprazole, potassium citrate, and tamsulosin, and the following Facility-Administered Medications: ropivacaine (pf) 2 mg/ml (0.2%). His primarily concern today is the Neck Pain  Initial Vital Signs:  Pulse/HCG Rate: 76ECG Heart Rate: 84 Temp: 98.5 F (36.9 C) Resp: (!) 22 BP: (!) 145/89 SpO2: 98 %  BMI: Estimated body mass index is 34.45 kg/m as calculated from the following:   Height as of this encounter: 6' (1.829 m).   Weight as of this encounter: 254 lb (115.2 kg).  Risk Assessment: Allergies: Reviewed. He is allergic to amlodipine besy-benazepril hcl; amlodipine; and hydrochlorothiazide.  Allergy Precautions: None required Coagulopathies: Reviewed. None identified.  Blood-thinner therapy: None at this time Active Infection(s): Reviewed. None identified. Mr. Hortman is afebrile  Site Confirmation: Mr. Ducre was asked to confirm the procedure and laterality before marking the site Procedure checklist: Completed Consent: Before the procedure and under the influence of no sedative(s), amnesic(s), or anxiolytics, the patient was informed of the treatment options, risks and possible complications. To fulfill our ethical and legal obligations, as recommended by the American Medical Association's Code of Ethics, I have informed the patient of my clinical impression; the nature and purpose of the treatment or procedure; the risks, benefits, and possible complications of the intervention; the alternatives, including doing nothing; the risk(s) and benefit(s) of the alternative treatment(s) or procedure(s); and the risk(s) and benefit(s) of doing nothing. The patient was provided  information about the general risks and possible complications associated with the procedure. These may include, but are not limited to: failure to achieve desired goals, infection, bleeding, organ  or nerve damage, allergic reactions, paralysis, and death. In addition, the patient was informed of those risks and complications associated to Spine-related procedures, such as failure to decrease pain; infection (i.e.: Meningitis, epidural or intraspinal abscess); bleeding (i.e.: epidural hematoma, subarachnoid hemorrhage, or any other type of intraspinal or peri-dural bleeding); organ or nerve damage (i.e.: Any type of peripheral nerve, nerve root, or spinal cord injury) with subsequent damage to sensory, motor, and/or autonomic systems, resulting in permanent pain, numbness, and/or weakness of one or several areas of the body; allergic reactions; (i.e.: anaphylactic reaction); and/or death. Furthermore, the patient was informed of those risks and complications associated with the medications. These include, but are not limited to: allergic reactions (i.e.: anaphylactic or anaphylactoid reaction(s)); adrenal axis suppression; blood sugar elevation that in diabetics may result in ketoacidosis or comma; water retention that in patients with history of congestive heart failure may result in shortness of breath, pulmonary edema, and decompensation with resultant heart failure; weight gain; swelling or edema; medication-induced neural toxicity; particulate matter embolism and blood vessel occlusion with resultant organ, and/or nervous system infarction; and/or aseptic necrosis of one or more joints. Finally, the patient was informed that Medicine is not an exact science; therefore, there is also the possibility of unforeseen or unpredictable risks and/or possible complications that may result in a catastrophic outcome. The patient indicated having understood very clearly. We have given the patient no guarantees and we  have made no promises. Enough time was given to the patient to ask questions, all of which were answered to the patient's satisfaction. Mr. Trudo has indicated that he wanted to continue with the procedure. Attestation: I, the ordering provider, attest that I have discussed with the patient the benefits, risks, side-effects, alternatives, likelihood of achieving goals, and potential problems during recovery for the procedure that I have provided informed consent. Date  Time: 05/10/2018  9:13 AM  Pre-Procedure Preparation:  Monitoring: As per clinic protocol. Respiration, ETCO2, SpO2, BP, heart rate and rhythm monitor placed and checked for adequate function Safety Precautions: Patient was assessed for positional comfort and pressure points before starting the procedure. Time-out: I initiated and conducted the "Time-out" before starting the procedure, as per protocol. The patient was asked to participate by confirming the accuracy of the "Time Out" information. Verification of the correct person, site, and procedure were performed and confirmed by me, the nursing staff, and the patient. "Time-out" conducted as per Joint Commission's Universal Protocol ([email or web address].01.01.01). Time: 0952  Description of Procedure #1:  Position: Prone with head of the table was raised to facilitate breathing. Target Area: For Epidural Steroid injections the target is the interlaminar space, initially targeting the lower border of the superior vertebral body lamina. Approach: Paramedial approach. Area Prepped: Entire PosteriorCervical Region Prepping solution: ChloraPrep (2% chlorhexidine gluconate and 70% isopropyl alcohol) Safety Precautions: Aspiration looking for blood return was conducted prior to all injections. At no point did we inject any substances, as a needle was being advanced. No attempts were made at seeking any paresthesias. Safe injection practices and needle disposal techniques used. Medications properly  checked for expiration dates. SDV (single dose vial) medications used. Description of the Procedure: Protocol guidelines were followed. The procedure needle was introduced through the skin, ipsilateral to the reported pain, and advanced to the target area. Bone was contacted and the needle walked caudad, until the lamina was cleared. The epidural space was identified using "loss-of-resistance technique" with 2-3 ml of PF-NaCl (0.9% NSS), in a 5cc LOR glass syringe.  Start Time: 0952 hrs.  Materials:  Needle(s) Type: Epidural needle Gauge: 17G Length: 3.5-in Medication(s): Please see orders for medications and dosing details.  Imaging Guidance (Spinal) for procedure #1:  Type of Imaging Technique: Fluoroscopy Guidance (Spinal) Indication(s): Assistance in needle guidance and placement for procedures requiring needle placement in or near specific anatomical locations not easily accessible without such assistance. Exposure Time: Please see nurses notes. Contrast: Before injecting any contrast, we confirmed that the patient did not have an allergy to iodine, shellfish, or radiological contrast. Once satisfactory needle placement was completed at the desired level, radiological contrast was injected. Contrast injected under live fluoroscopy. No contrast complications. See chart for type and volume of contrast used. Fluoroscopic Guidance: I was personally present during the use of fluoroscopy. "Tunnel Vision Technique" used to obtain the best possible view of the target area. Parallax error corrected before commencing the procedure. "Direction-depth-direction" technique used to introduce the needle under continuous pulsed fluoroscopy. Once target was reached, antero-posterior, oblique, and lateral fluoroscopic projection used confirm needle placement in all planes. Images permanently stored in EMR. Interpretation: I personally interpreted the imaging intraoperatively. Adequate needle placement confirmed  in multiple planes. Appropriate spread of contrast into desired area was observed. No evidence of afferent or efferent intravascular uptake. No intrathecal or subarachnoid spread observed. Permanent images saved into the patient's record.  Description of Procedure #2:  Area Prepped: Entire Posterior Cervical Region Prepping solution: ChloraPrep (2% chlorhexidine gluconate and 70% isopropyl alcohol) Safety Precautions: Aspiration looking for blood return was conducted prior to all injections. At no point did we inject any substances, as a needle was being advanced. No attempts were made at seeking any paresthesias. Safe injection practices and needle disposal techniques used. Medications properly checked for expiration dates. SDV (single dose vial) medications used. Description of the Procedure: Protocol guidelines were followed. The patient was placed in position over the fluoroscopy table. The target area was identified and the area prepped in the usual manner. Skin & deeper tissues infiltrated with local anesthetic. Appropriate amount of time allowed to pass for local anesthetics to take effect. The procedure needles were then advanced to the target area. Proper needle placement secured. Negative aspiration confirmed. Solution injected in intermittent fashion, asking for systemic symptoms every 0.5cc of injectate. The needles were then removed and the area cleansed, making sure to leave some of the prepping solution back to take advantage of its long term bactericidal properties.  Vitals:   05/10/18 0942 05/10/18 0947 05/10/18 0952 05/10/18 0956  BP: 124/83 (!) 140/99 (!) 129/99 (!) 125/91  Pulse:      Resp: (!) 22 19 (!) 23 20  Temp:      SpO2: 95% 95% 95% 95%  Weight:      Height:       End Time: 0956 hrs. Materials:  Needle(s) Type: Epidural needle Gauge: 22G Length: 1.5-in Medication(s): Please see orders for medications and dosing details.  Imaging Guidance:          Type of Imaging  Technique: None used Indication(s): N/A Exposure Time: No patient exposure Contrast: None used. Fluoroscopic Guidance: N/A Ultrasound Guidance: N/A Interpretation: N/A  Antibiotic Prophylaxis:   Anti-infectives (From admission, onward)   None     Indication(s): None identified  Post-operative Assessment:  Post-procedure Vital Signs:  Pulse/HCG Rate: 7680 Temp: 98.5 F (36.9 C) Resp: 20 BP: (!) 125/91 SpO2: 95 %  EBL: None  Complications: No immediate post-treatment complications observed by team, or reported by patient.  Note:  The patient tolerated the entire procedure well. A repeat set of vitals were taken after the procedure and the patient was kept under observation following institutional policy, for this type of procedure. Post-procedural neurological assessment was performed, showing return to baseline, prior to discharge. The patient was provided with post-procedure discharge instructions, including a section on how to identify potential problems. Should any problems arise concerning this procedure, the patient was given instructions to immediately contact us, at any time, without hesitation. In any case, we plan to contact the patient by telephone for a follow-up status report regarding this interventional procedure.  Comments:  No additional relevant information.  Plan of Care   Imaging Orders     DG C-Arm 1-60 Min-No Report  Procedure Orders     Cervical Epidural Injection     TRIGGER POINT INJECTION  Medications ordered for procedure: Meds ordered this encounter  Medications  . iopamidol (ISOVUE-M) 41 % intrathecal injection 10 mL    Must be Myelogram-compatible. If not available, you may substitute with a water-soluble, non-ionic, hypoallergenic, myelogram-compatible radiological contrast medium.  Marland Kitchen lidocaine (XYLOCAINE) 2 % (with pres) injection 400 mg  . sodium chloride flush (NS) 0.9 % injection 1 mL  . ropivacaine (PF) 2 mg/mL (0.2%) (NAROPIN)  injection 1 mL  . dexamethasone (DECADRON) injection 10 mg  . ropivacaine (PF) 2 mg/mL (0.2%) (NAROPIN) injection 4 mL  . methylPREDNISolone acetate (DEPO-MEDROL) injection 80 mg  . baclofen (LIORESAL) 10 MG tablet    Sig: Take 1-2 tablets (10-20 mg total) by mouth every 6 (six) hours as needed for muscle spasms.    Dispense:  120 tablet    Refill:  0    Do not place this medication, or any other prescription from our practice, on "Automatic Refill". Patient may have prescription filled one day early if pharmacy is closed on scheduled refill date.   Medications administered: We administered iopamidol, lidocaine, sodium chloride flush, ropivacaine (PF) 2 mg/mL (0.2%), dexamethasone, and methylPREDNISolone acetate.  See the medical record for exact dosing, route, and time of administration.  New Prescriptions   No medications on file   Disposition: Discharge home  Discharge Date & Time: 05/10/2018; 1005 hrs.   Physician-requested Follow-up: Return for post-procedure eval (2 wks), w/ Dr. Dossie Arbour.  Future Appointments  Date Time Provider [place]  05/17/2018  5:00 PM [facility]  05/22/2018  1:30 PM Aldona Lento, PT [facility] None  05/29/2018  2:30 PM Aldona Lento, PT [facility] None  05/30/2018 10:15 AM Milinda Pointer, MD [facility] None  05/31/2018  2:30 PM Aldona Lento, PT [facility] None  06/04/2018  1:45 PM Aldona Lento, PT [facility] None  06/07/2018  2:15 PM Aldona Lento, PT [facility] None  04/15/2019  1:45 PM [place], Marlowe Kays None   Primary Care Physician: Remi Haggard, FNP Location: [facility] Note by: Gaspar Cola, MD Date: 05/10/2018; Time: 10:19 AM  Disclaimer:  Medicine is not an Chief Strategy Officer. The only guarantee in medicine is that nothing is guaranteed. It is important to note that the decision to proceed with this intervention was based on the information collected from the patient. The Data  and conclusions were drawn from the patient's questionnaire, the interview, and the physical examination. Because the information was provided in large part by the patient, it cannot be guaranteed that it has not been purposely or unconsciously manipulated. Every effort has been made to obtain as much relevant data  as possible for this evaluation. It is important to note that the conclusions that lead to this procedure are derived in large part from the available data. Always take into account that the treatment will also be dependent on availability of resources and existing treatment guidelines, considered by other Pain Management Practitioners as being common knowledge and practice, at the time of the intervention. For Medico-Legal purposes, it is also important to point out that variation in procedural techniques and pharmacological choices are the acceptable norm. The indications, contraindications, technique, and results of the above procedure should only be interpreted and judged by a Board-Certified Interventional Pain Specialist with extensive familiarity and expertise in the same exact procedure and technique.

## 2018-05-10 ENCOUNTER — Ambulatory Visit
Admission: RE | Admit: 2018-05-10 | Discharge: 2018-05-10 | Disposition: A | Discharge status: Home / Self Care | Payer: Medicaid Other | Source: Ambulatory Visit | Attending: Pain Medicine | Admitting: Pain Medicine

## 2018-05-10 ENCOUNTER — Other Ambulatory Visit: Payer: Self-pay

## 2018-05-10 ENCOUNTER — Ambulatory Visit: Payer: Medicaid Other | Admitting: Pain Medicine

## 2018-05-10 ENCOUNTER — Encounter: Payer: Self-pay | Admitting: Pain Medicine

## 2018-05-10 VITALS — BP 125/91 | HR 76 | Temp 98.5°F | Resp 20 | Ht 72.0 in | Wt 254.0 lb

## 2018-05-10 DIAGNOSIS — M7918 Myalgia, other site: Secondary | ICD-10-CM | POA: Insufficient documentation

## 2018-05-10 DIAGNOSIS — M5030 Other cervical disc degeneration, unspecified cervical region: Secondary | ICD-10-CM | POA: Diagnosis not present

## 2018-05-10 DIAGNOSIS — Z79899 Other long term (current) drug therapy: Secondary | ICD-10-CM | POA: Insufficient documentation

## 2018-05-10 DIAGNOSIS — M62838 Other muscle spasm: Secondary | ICD-10-CM

## 2018-05-10 DIAGNOSIS — Z9889 Other specified postprocedural states: Secondary | ICD-10-CM | POA: Insufficient documentation

## 2018-05-10 DIAGNOSIS — M542 Cervicalgia: Secondary | ICD-10-CM

## 2018-05-10 DIAGNOSIS — Z888 Allergy status to other drugs, medicaments and biological substances status: Secondary | ICD-10-CM | POA: Insufficient documentation

## 2018-05-10 DIAGNOSIS — M5412 Radiculopathy, cervical region: Secondary | ICD-10-CM

## 2018-05-10 DIAGNOSIS — M4722 Other spondylosis with radiculopathy, cervical region: Secondary | ICD-10-CM | POA: Diagnosis not present

## 2018-05-10 DIAGNOSIS — G8929 Other chronic pain: Secondary | ICD-10-CM | POA: Diagnosis present

## 2018-05-10 MED ORDER — ROPIVACAINE HCL 2 MG/ML IJ SOLN
1.0000 mL | Freq: Once | INTRAMUSCULAR | Status: AC
  Administered 2018-05-10: 1 mL via EPIDURAL

## 2018-05-10 MED ORDER — LIDOCAINE HCL 2 % IJ SOLN
INTRAMUSCULAR | Status: AC
  Filled 2018-05-10: qty 20

## 2018-05-10 MED ORDER — IOPAMIDOL (ISOVUE-M 200) INJECTION 41%
10.0000 mL | Freq: Once | INTRAMUSCULAR | Status: AC
  Administered 2018-05-10: 10 mL via EPIDURAL
  Filled 2018-05-10: qty 10

## 2018-05-10 MED ORDER — SODIUM CHLORIDE 0.9% FLUSH
1.0000 mL | Freq: Once | INTRAVENOUS | Status: AC
  Administered 2018-05-10: 1 mL

## 2018-05-10 MED ORDER — METHYLPREDNISOLONE ACETATE 80 MG/ML IJ SUSP
INTRAMUSCULAR | Status: AC
Start: 2018-05-10 — End: ?
  Filled 2018-05-10: qty 1

## 2018-05-10 MED ORDER — ROPIVACAINE HCL 2 MG/ML IJ SOLN: 4.0000 mL | Freq: Once | INTRAMUSCULAR | Status: DC

## 2018-05-10 MED ORDER — DEXAMETHASONE SODIUM PHOSPHATE 10 MG/ML IJ SOLN
INTRAMUSCULAR | Status: AC
  Filled 2018-05-10: qty 1

## 2018-05-10 MED ORDER — METHYLPREDNISOLONE ACETATE 80 MG/ML IJ SUSP
80.0000 mg | Freq: Once | INTRAMUSCULAR | Status: AC
  Administered 2018-05-10: 80 mg

## 2018-05-10 MED ORDER — ROPIVACAINE HCL 2 MG/ML IJ SOLN
INTRAMUSCULAR | Status: AC
  Filled 2018-05-10: qty 10

## 2018-05-10 MED ORDER — SODIUM CHLORIDE 0.9 % IJ SOLN
INTRAMUSCULAR | Status: AC
  Filled 2018-05-10: qty 10

## 2018-05-10 MED ORDER — DEXAMETHASONE SODIUM PHOSPHATE 10 MG/ML IJ SOLN
10.0000 mg | Freq: Once | INTRAMUSCULAR | Status: AC
  Administered 2018-05-10: 10 mg

## 2018-05-10 MED ORDER — BACLOFEN 10 MG PO TABS
10.0000 mg | ORAL_TABLET | Freq: Four times a day (QID) | ORAL | 0 refills | Status: DC | PRN
Start: 2018-05-10 — End: 2018-05-30

## 2018-05-10 MED ORDER — LIDOCAINE HCL 2 % IJ SOLN
20.0000 mL | Freq: Once | INTRAMUSCULAR | Status: AC
  Administered 2018-05-10: 400 mg

## 2018-05-10 NOTE — Patient Instructions (Addendum)
____________________________________________________________________________________________  Post-Procedure Discharge Instructions  Instructions:  Apply ice: Fill a plastic sandwich bag with crushed ice. Cover it with a small towel and apply to injection site. Apply for 15 minutes then remove x 15 minutes. Repeat sequence on day of procedure, until you go to bed. The purpose is to minimize swelling and discomfort after procedure.  Apply heat: Apply heat to procedure site starting the day following the procedure. The purpose is to treat any soreness and discomfort from the procedure.  Food intake: Start with clear liquids (like water) and advance to regular food, as tolerated.   Physical activities: Keep activities to a minimum for the first 8 hours after the procedure.   Driving: If you have received any sedation, you are not allowed to drive for 24 hours after your procedure.  Blood thinner: Restart your blood thinner 6 hours after your procedure. (Only for those taking blood thinners)  Insulin: As soon as you can eat, you may resume your normal dosing schedule. (Only for those taking insulin)  Infection prevention: Keep procedure site clean and dry.  Post-procedure Pain Diary: Extremely important that this be done correctly and accurately. Recorded information will be used to determine the next step in treatment.  Pain evaluated is that of treated area only. Do not include pain from an untreated area.  Complete every hour, on the hour, for the initial 8 hours. Set an alarm to help you do this part accurately.  Do not go to sleep and have it completed later. It will not be accurate.  Follow-up appointment: Keep your follow-up appointment after the procedure. Usually 2 weeks for most procedures. (6 weeks in the case of radiofrequency.) Bring you pain diary.   Expect:  From numbing medicine (AKA: Local Anesthetics): Numbness or decrease in pain.  Onset: Full effect within 15  minutes of injected.  Duration: It will depend on the type of local anesthetic used. On the average, 1 to 8 hours.   From steroids: Decrease in swelling or inflammation. Once inflammation is improved, relief of the pain will follow.  Onset of benefits: Depends on the amount of swelling present. The more swelling, the longer it will take for the benefits to be seen. In some cases, up to 10 days.  Duration: Steroids will stay in the system x 2 weeks. Duration of benefits will depend on multiple posibilities including persistent irritating factors.  From procedure: Some discomfort is to be expected once the numbing medicine wears off. This should be minimal if ice and heat are applied as instructed.  Call if:  You experience numbness and weakness that gets worse with time, as opposed to wearing off.  New onset bowel or bladder incontinence. (This applies to Spinal procedures only)  Emergency Numbers:  Durning business hours (Monday - Thursday, 8:00 AM - 4:00 PM) (Friday, 9:00 AM - 12:00 Noon): [phone]  After hours: [phone] ____________________________________________________________________________________________   ____________________________________________________________________________________________  Pain Scale  Introduction: The pain score used by this practice is the Verbal Numerical Rating Scale (VNRS-11). This is an 11-point scale. It is for adults and children 10 years or older. There are significant differences in how the pain score is reported, used, and applied. Forget everything you learned in the past and learn this scoring system.  General Information: The scale should reflect your current level of pain. Unless you are specifically asked for the level of your worst pain, or your average pain. If you are asked for one of these two, then  it should be understood that it is over the past 24 hours.  Basic Activities of Daily Living (ADL): Personal hygiene,  dressing, eating, transferring, and using restroom.  Instructions: Most patients tend to report their level of pain as a combination of two factors, their physical pain and their psychosocial pain. This last one is also known as "suffering" and it is reflection of how physical pain affects you socially and psychologically. From now on, report them separately. From this point on, when asked to report your pain level, report only your physical pain. Use the following table for reference.  Pain Clinic Pain Levels (0-5/10)  Pain Level Score  Description  No Pain 0   Mild pain 1 Nagging, annoying, but does not interfere with basic activities of daily living (ADL). Patients are able to eat, bathe, get dressed, toileting (being able to get on and off the toilet and perform personal hygiene functions), transfer (move in and out of bed or a chair without assistance), and maintain continence (able to control bladder and bowel functions). Blood pressure and heart rate are unaffected. A normal heart rate for a healthy adult ranges from 60 to 100 bpm (beats per minute).   Mild to moderate pain 2 Noticeable and distracting. Impossible to hide from other people. More frequent flare-ups. Still possible to adapt and function close to normal. It can be very annoying and may have occasional stronger flare-ups. With discipline, patients may get used to it and adapt.   Moderate pain 3 Interferes significantly with activities of daily living (ADL). It becomes difficult to feed, bathe, get dressed, get on and off the toilet or to perform personal hygiene functions. Difficult to get in and out of bed or a chair without assistance. Very distracting. With effort, it can be ignored when deeply involved in activities.   Moderately severe pain 4 Impossible to ignore for more than a few minutes. With effort, patients may still be able to manage work or participate in some social activities. Very difficult to concentrate. Signs of  autonomic nervous system discharge are evident: dilated pupils (mydriasis); mild sweating (diaphoresis); sleep interference. Heart rate becomes elevated (>115 bpm). Diastolic blood pressure (lower number) rises above 100 mmHg. Patients find relief in laying down and not moving.   Severe pain 5 Intense and extremely unpleasant. Associated with frowning face and frequent crying. Pain overwhelms the senses.  Ability to do any activity or maintain social relationships becomes significantly limited. Conversation becomes difficult. Pacing back and forth is common, as getting into a comfortable position is nearly impossible. Pain wakes you up from deep sleep. Physical signs will be obvious: pupillary dilation; increased sweating; goosebumps; brisk reflexes; cold, clammy hands and feet; nausea, vomiting or dry heaves; loss of appetite; significant sleep disturbance with inability to fall asleep or to remain asleep. When persistent, significant weight loss is observed due to the complete loss of appetite and sleep deprivation.  Blood pressure and heart rate becomes significantly elevated. Caution: If elevated blood pressure triggers a pounding headache associated with blurred vision, then the patient should immediately seek attention at an urgent or emergency care unit, as these may be signs of an impending stroke.    Emergency Department Pain Levels (6-10/10)  Emergency Room Pain 6 Severely limiting. Requires emergency care and should not be seen or managed at an outpatient pain management facility. Communication becomes difficult and requires great effort. Assistance to reach the emergency department may be required. Facial flushing and profuse sweating along with potentially  dangerous increases in heart rate and blood pressure will be evident.   Distressing pain 7 Self-care is very difficult. Assistance is required to transport, or use restroom. Assistance to reach the emergency department will be required. Tasks  requiring coordination, such as bathing and getting dressed become very difficult.   Disabling pain 8 Self-care is no longer possible. At this level, pain is disabling. The individual is unable to do even the most "basic" activities such as walking, eating, bathing, dressing, transferring to a bed, or toileting. Fine motor skills are lost. It is difficult to think clearly.   Incapacitating pain 9 Pain becomes incapacitating. Thought processing is no longer possible. Difficult to remember your own name. Control of movement and coordination are lost.   The worst pain imaginable 10 At this level, most patients pass out from pain. When this level is reached, collapse of the autonomic nervous system occurs, leading to a sudden drop in blood pressure and heart rate. This in turn results in a temporary and dramatic drop in blood flow to the brain, leading to a loss of consciousness. Fainting is one of the body's self defense mechanisms. Passing out puts the brain in a calmed state and causes it to shut down for a while, in order to begin the healing process.    Summary: 1. Refer to this scale when providing [place] with your pain level. 2. Be accurate and careful when reporting your pain level. This will help with your care. 3. Over-reporting your pain level will lead to loss of credibility. 4. Even a level of 1/10 means that there is pain and will be treated at our facility. 5. High, inaccurate reporting will be documented as "Symptom Exaggeration", leading to loss of credibility and suspicions of possible secondary gains such as obtaining more narcotics, or wanting to appear disabled, for fraudulent reasons. 6. Only pain levels of 5 or below will be seen at our facility. 7. Pain levels of 6 and above will be sent to the Emergency Department and the appointment cancelled. ____________________________________________________________________________________________   Pain Management Discharge  Instructions  General Discharge Instructions :  If you need to reach your doctor call: Monday-Friday 8:00 am - 4:00 pm at [phone] or toll free [phone].  After clinic hours [phone] to have operator reach doctor.  Bring all of your medication bottles to all your appointments in the pain clinic.  To cancel or reschedule your appointment with Pain Management please remember to call 24 hours in advance to avoid a fee.  Refer to the educational materials which you have been given on: General Risks, I had my Procedure. Discharge Instructions, Post Sedation.  Post Procedure Instructions:  The drugs you were given will stay in your system until tomorrow, so for the next 24 hours you should not drive, make any legal decisions or drink any alcoholic beverages.  You may eat anything you prefer, but it is better to start with liquids then soups and crackers, and gradually work up to solid foods.  Please notify your doctor immediately if you have any unusual bleeding, trouble breathing or pain that is not related to your normal pain.  Depending on the type of procedure that was done, some parts of your body may feel week and/or numb.  This usually clears up by tonight or the next day.  Walk with the use of an assistive device or accompanied by an adult for the 24 hours.  You may use ice on the affected area for the first 24  hours.  Put ice in a Ziploc bag and cover with a towel and place against area 15 minutes on 15 minutes off.  You may switch to heat after 24 hours.Epidural Steroid Injection Patient Information  Description: The epidural space surrounds the nerves as they exit the spinal cord.  In some patients, the nerves can be compressed and inflamed by a bulging disc or a tight spinal canal (spinal stenosis).  By injecting steroids into the epidural space, we can bring irritated nerves into direct contact with a potentially helpful medication.  These steroids act directly on the  irritated nerves and can reduce swelling and inflammation which often leads to decreased pain.  Epidural steroids may be injected anywhere along the spine and from the neck to the low back depending upon the location of your pain.   After numbing the skin with local anesthetic (like Novocaine), a small needle is passed into the epidural space slowly.  You may experience a sensation of pressure while this is being done.  The entire block usually last less than 10 minutes.  Conditions which may be treated by epidural steroids:   Low back and leg pain  Neck and arm pain  Spinal stenosis  Post-laminectomy syndrome  Herpes zoster (shingles) pain  Pain from compression fractures  Preparation for the injection:  1. Do not eat any solid food or dairy products within 8 hours of your appointment.  2. You may drink clear liquids up to 3 hours before appointment.  Clear liquids include water, black coffee, juice or soda.  No milk or cream please. 3. You may take your regular medication, including pain medications, with a sip of water before your appointment  Diabetics should hold regular insulin (if taken separately) and take 1/2 normal NPH dos the morning of the procedure.  Carry some sugar containing items with you to your appointment. 4. A driver must accompany you and be prepared to drive you home after your procedure.  5. Bring all your current medications with your. 6. An IV may be inserted and sedation may be given at the discretion of the physician.   7. A blood pressure cuff, EKG and other monitors will often be applied during the procedure.  Some patients may need to have extra oxygen administered for a short period. 8. You will be asked to provide medical information, including your allergies, prior to the procedure.  We must know immediately if you are taking blood thinners (like Coumadin/Warfarin)  Or if you are allergic to IV iodine contrast (dye). We must know if you could possible be  pregnant.  Possible side-effects:  Bleeding from needle site  Infection (rare, may require surgery)  Nerve injury (rare)  Numbness & tingling (temporary)  Difficulty urinating (rare, temporary)  Spinal headache ( a headache worse with upright posture)  Light -headedness (temporary)  Pain at injection site (several days)  Decreased blood pressure (temporary)  Weakness in arm/leg (temporary)  Pressure sensation in back/neck (temporary)  Call if you experience:  Fever/chills associated with headache or increased back/neck pain.  Headache worsened by an upright position.  New onset weakness or numbness of an extremity below the injection site  Hives or difficulty breathing (go to the emergency room)  Inflammation or drainage at the infection site  Severe back/neck pain  Any new symptoms which are concerning to you  Please note:  Although the local anesthetic injected can often make your back or neck feel good for several hours after the injection, the  pain will likely return.  It takes 3-7 days for steroids to work in the epidural space.  You may not notice any pain relief for at least that one week.  If effective, we will often do a series of three injections spaced 3-6 weeks apart to maximally decrease your pain.  After the initial series, we generally will wait several months before considering a repeat injection of the same type.  If you have any questions, please call [phone] [facility]  ____________________________________________________________________________________________  Muscle Spasms & Cramps  Cause:  The most common cause of muscle spasms and cramps is vitamin and/or electrolyte (calcium, potassium, sodium, etc.) deficiencies.  Possible triggers: Sweating - causes loss of electrolytes thru the skin. Steroids - causes loss of electrolytes thru the urine.  Treatment: 1. Gatorade (or any other  electrolyte-replenishing drink) - Take 1, 8 oz glass with each meal (3 times a day). 2. OTC (over-the-counter) Magnesium 400 to 500 mg - Take 1 tablet twice a day (one with breakfast and one before bedtime). If you have kidney problems, talk to your primary care physician before taking any Magnesium. 3. Tonic Water with quinine - Take 1, 8 oz glass before bedtime.   ____________________________________________________________________________________________

## 2018-05-11 ENCOUNTER — Telehealth: Payer: Self-pay

## 2018-05-11 ENCOUNTER — Telehealth: Payer: Self-pay | Admitting: Pain Medicine

## 2018-05-11 NOTE — Telephone Encounter (Signed)
Patient wants to have MRI of his head as well as cervical and wants to get done on 05-17-18 if possible. Please let patient know what Dr. Dossie Arbour decides. Still having severe headache.

## 2018-05-11 NOTE — Telephone Encounter (Signed)
Post procedure phone call.  LM 

## 2018-05-14 NOTE — Telephone Encounter (Signed)
No answer. LVM for patient to return call.

## 2018-05-17 ENCOUNTER — Ambulatory Visit
Admission: RE | Admit: 2018-05-17 | Discharge: 2018-05-17 | Disposition: A | Discharge status: Home / Self Care | Payer: Medicaid Other | Source: Ambulatory Visit | Attending: Pain Medicine | Admitting: Pain Medicine

## 2018-05-17 DIAGNOSIS — G8929 Other chronic pain: Secondary | ICD-10-CM

## 2018-05-17 DIAGNOSIS — M4802 Spinal stenosis, cervical region: Secondary | ICD-10-CM | POA: Diagnosis not present

## 2018-05-17 DIAGNOSIS — M5030 Other cervical disc degeneration, unspecified cervical region: Secondary | ICD-10-CM | POA: Insufficient documentation

## 2018-05-17 DIAGNOSIS — M50221 Other cervical disc displacement at C4-C5 level: Secondary | ICD-10-CM | POA: Insufficient documentation

## 2018-05-17 DIAGNOSIS — M47812 Spondylosis without myelopathy or radiculopathy, cervical region: Secondary | ICD-10-CM | POA: Diagnosis not present

## 2018-05-17 DIAGNOSIS — M542 Cervicalgia: Secondary | ICD-10-CM

## 2018-05-22 ENCOUNTER — Other Ambulatory Visit: Payer: Self-pay

## 2018-05-22 ENCOUNTER — Ambulatory Visit: Payer: Medicaid Other | Attending: Pain Medicine

## 2018-05-22 DIAGNOSIS — M542 Cervicalgia: Secondary | ICD-10-CM | POA: Diagnosis present

## 2018-05-22 DIAGNOSIS — M62838 Other muscle spasm: Secondary | ICD-10-CM | POA: Diagnosis present

## 2018-05-22 NOTE — Patient Instructions (Signed)
Access Code: [email or web address]  URL: [email or web address]  Date: 05/22/2018  Prepared by: Lieutenant Diego   Exercises  Neck Rotation - 15 reps - 1 sets - 1x daily - 7x weekly  Standing Isometric Cervical Flexion with Manual Resistance - 10 reps - 1 sets - 3 hold - 1x daily - 7x weekly  Educated about proper posture and importance
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
## 2018-05-29 ENCOUNTER — Ambulatory Visit: Payer: Medicaid Other | Attending: Pain Medicine

## 2018-05-29 DIAGNOSIS — M62838 Other muscle spasm: Secondary | ICD-10-CM | POA: Diagnosis present

## 2018-05-29 DIAGNOSIS — M542 Cervicalgia: Secondary | ICD-10-CM | POA: Insufficient documentation

## 2018-05-29 NOTE — Progress Notes (Signed)
Patient's Name: Mark Vang  MRN: [id number]  Referring Provider: Remi Haggard, FNP  DOB: 08-Apr-1961  PCP: Remi Haggard, FNP  DOS: 05/30/2018  Note by: Gaspar Cola, MD  Service setting: Ambulatory outpatient  Specialty: Interventional Pain Management  Location: [facility] (AMB) Pain Management Facility    Patient type: Established   Primary Reason(s) for Visit: Encounter for post-procedure evaluation of chronic illness with mild to moderate exacerbation CC: Neck Pain (right)  HPI  Mark Vang is a 57 y.o. year old, male patient, who comes today for a post-procedure evaluation. 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; Seizure ([place]); Seizures ([place]); Sinusitis; Stroke ([place]); Tobacco abuse; Chronic lower extremity pain (Tertiary Area of Pain) (Bilateral) (R>L); Chronic low back pain (Secondary Area of Pain) (Bilateral) (R>L); Chronic pain syndrome; Long term current use of opiate analgesic; Pharmacologic therapy; Disorder of skeletal system; Problems influencing health status; Current tear knee, medial meniscus; Cervical radiculopathy; Osteoarthritis of knee; Convulsions ([place]); Chronic hip pain [facility] Area of Pain) (Right); Chronic neck pain (Primary Area of Pain) (Bilateral) (L>R); DDD (degenerative disc disease), cervical; Muscle spasms of neck; Opiate use; Cervicogenic headache; Occipital headache (Bilateral); Cervico-occipital neuralgia (Bilateral); Lumbar facet syndrome (Bilateral); Lumbar facet arthropathy (Bilateral); DDD (degenerative disc disease), lumbosacral; Chronic sacroiliac joint pain (Left); Osteoarthritis of spine with radiculopathy, cervical region; Chronic musculoskeletal pain; Trigger point of neck; Chronic nonintractable headache; Neurogenic pain; Cervical facet syndrome; Spondylosis without myelopathy or  radiculopathy, cervical region; and Abnormal MRI, cervical spine on their problem list. His primarily concern today is the Neck Pain (right)  Pain Assessment: Location: Right Neck Radiating: head Onset: More than a month ago Duration: Chronic pain Quality: Pressure, Pounding Severity: 9 /10 (subjective, self-reported pain score)  Note: Reported level is inconsistent with clinical observations. Clinically the patient looks like a 2/10 A 2/10 is viewed as "Mild to Moderate" and described as noticeable and distracting. Impossible to hide from other people. More frequent flare-ups. Still possible to adapt and function close to normal. It can be very annoying and may have occasional stronger flare-ups. With discipline, patients may get used to it and adapt. Information on the proper use of the pain scale provided to the patient today. When using our objective Pain Scale, levels between 6 and 10/10 are said to belong in an emergency room, as it progressively worsens from a 6/10, described as severely limiting, requiring emergency care not usually available at an outpatient pain management facility. At a 6/10 level, communication becomes difficult and requires great effort. Assistance to reach the emergency department may be required. Facial flushing and profuse sweating along with potentially dangerous increases in heart rate and blood pressure will be evident. Timing: Intermittent Modifying factors: medication BP: 119/74  HR: 80  Mark Vang comes in today for post-procedure evaluation after the treatment done on 05/11/2018. A couple days ago the patient showed up to the clinic without an appointment indicating that he had some dental work done and he had taken more of his medicine because his dentist had not given him any pain medicine. He intended for [place] to give him an early refill. Today I had a very long conversation with the patient for several reasons. The first reason was that he clearly did not read  our medication policy or we clearly state that we will not be doing any early refills. In addition, he did not read the  information that we gave him where he clearly stated that should he have any type of surgery, he needed to notify us and not allow the surgeon to instruct him to take more of his usual pain medicine. Finally, we had a very long conversation about him lying to me. He stated to me and to my staff that he had not received any pain medication after his dental surgery when in fact this was not true. A review of the PMP revealed that he had been given pain medication and he simply took all of it in addition to taking oral we prescribed for him. Today I have given him a final warning with regards to this type of activity and being honest with me. Although I will keep be given him a final warning and this last chance, he is now well aware that doing this again will result in him being discharged from our service.  Further details on both, my assessment(s), as well as the proposed treatment plan, please see below.  Post-Procedure Assessment  05/11/2018 Procedure: Diagnostic left-sided cervical epidural steroid injection #1 under fluoroscopic guidance, no sedation + Therapeutic  Cervical paravertebral Trigger point injection  Pre-procedure pain score:  9/10 Post-procedure pain score: 9/10 No initial benefit, possible due to rapid discharge after no sedation procedure, without enough time to allow full onset of block. Influential Factors: BMI: 33.09 kg/m Intra-procedural challenges: None observed.         Assessment challenges: None detected.              Reported side-effects: None.        Post-procedural adverse reactions or complications: None reported         Sedation: No sedation used. When no sedatives are used, the analgesic levels obtained are directly associated to the effectiveness of the local anesthetics. However, when sedation is provided, the level of analgesia obtained during the  initial 1 hour following the intervention, is believed to be the result of a combination of factors. These factors may include, but are not limited to: 1. The effectiveness of the local anesthetics used. 2. The effects of the analgesic(s) and/or anxiolytic(s) used. 3. The degree of discomfort experienced by the patient at the time of the procedure. 4. The patients ability and reliability in recalling and recording the events. 5. The presence and influence of possible secondary gains and/or psychosocial factors. Reported result: Relief experienced during the 1st hour after the procedure: 60 % (Ultra-Short Term Relief)            Interpretative annotation: Clinically appropriate result. No IV Analgesic or Anxiolytic given, therefore benefits are completely due to Local Anesthetic effects. Partial relief from local anesthetics would suggest that treated area is not 100% responsible for the patient's symptoms.  Effects of local anesthetic: The analgesic effects attained during this period are directly associated to the localized infiltration of local anesthetics and therefore cary significant diagnostic value as to the etiological location, or anatomical origin, of the pain. Expected duration of relief is directly dependent on the pharmacodynamics of the local anesthetic used. Long-acting (4-6 hours) anesthetics used.  Reported result: Relief during the next 4 to 6 hour after the procedure: 60 % (Short-Term Relief)            Interpretative annotation: Clinically appropriate result. Analgesia during this period is likely to be Local Anesthetic-related.          Long-term benefit: Defined as the period of time past the expected duration of local  anesthetics (1 hour for short-acting and 4-6 hours for long-acting). With the possible exception of prolonged sympathetic blockade from the local anesthetics, benefits during this period are typically attributed to, or associated with, other factors such as analgesic  sensory neuropraxia, antiinflammatory effects, or beneficial biochemical changes provided by agents other than the local anesthetics.  Reported result: Extended relief following procedure: 70 %(Neck pain better, but headache not better.) (Long-Term Relief)            Interpretative annotation: Clinically possible results. Good relief. No permanent benefit expected. Inflammation plays a part in the etiology to the pain.          Current benefits: Defined as reported results that persistent at this point in time.   Analgesia: >50 %            Function: Somewhat improved ROM: Somewhat improved Interpretative annotation: Ongoing benefit. Therapeutic benefit observed. Effective therapeutic approach.          Interpretation: Results would suggest a successful diagnostic intervention.                  Plan:  the patient seems to have a significant component of the pain that is axial and apparently closer to the upper portion of the cervical spine which may lead to his headaches. We will bring him in for a diagnostic bilateral cervical facet block under fluoroscopic guidance and IV sedation.                Laboratory Chemistry  Inflammation Markers (CRP: Acute Phase) (ESR: Chronic Phase) Lab Results  Component Value Date   CRP <0.8 04/02/2018   ESRSEDRATE 2 04/02/2018                         Renal Markers Lab Results  Component Value Date   BUN 27 (H) 04/16/2018   CREATININE 1.12 04/16/2018   BCR 11 03/13/2018   GFRAA >60 04/16/2018   GFRNONAA >60 04/16/2018                             Hepatic Markers Lab Results  Component Value Date   AST 25 04/16/2018   ALT 29 04/16/2018   ALBUMIN 4.2 04/16/2018                        Neuropathy Markers Lab Results  Component Value Date   VITAMINB12 186 04/02/2018   HGBA1C 6.0 06/29/2012                        Hematology Parameters Lab Results  Component Value Date   INR 0.91 04/16/2018   LABPROT 12.2 04/16/2018   APTT 28 04/16/2018    PLT 297 04/16/2018   HGB 14.0 04/16/2018   HCT 42.2 04/16/2018                        CV Markers Lab Results  Component Value Date   CKTOTAL 43 06/28/2012   CKMB < 0.5 (L) 06/28/2012   TROPONINI <0.03 04/16/2018                         Note: Lab results reviewed.  Recent Imaging Results   Results for orders placed in visit on 05/10/18  DG C-Arm 1-60 Min-No Report   Narrative Fluoroscopy was utilized by  the requesting physician.  No radiographic  interpretation.    Interpretation Report: Fluoroscopy was used during the procedure to assist with needle guidance. The images were interpreted intraoperatively by the requesting physician.  Meds   Current Outpatient Medications:  .  amLODipine (NORVASC) 5 MG tablet, Take 5 mg by mouth daily., Disp: , Rfl:  .  aspirin 325 MG EC tablet, Take 325 mg by mouth daily., Disp: , Rfl:  .  atorvastatin (LIPITOR) 40 MG tablet, Take 40 mg by mouth daily., Disp: , Rfl:  .  baclofen (LIORESAL) 10 MG tablet, Take 1-2 tablets (10-20 mg total) by mouth every 6 (six) hours as needed for muscle spasms., Disp: 120 tablet, Rfl: 0 .  cholecalciferol (VITAMIN D) 1000 units tablet, Take 1,000 Units by mouth daily., Disp: , Rfl:  .  dexlansoprazole (DEXILANT) 60 MG capsule, Take 60 mg by mouth daily., Disp: , Rfl:  .  fluticasone (FLONASE) 50 MCG/ACT nasal spray, Place 1 spray into both nostrils daily. , Disp: , Rfl:  .  Fluticasone-Umeclidin-Vilant (TRELEGY ELLIPTA) 100-62.5-25 MCG/INH AEPB, Inhale into the lungs., Disp: , Rfl:  .  lisinopril (PRINIVIL,ZESTRIL) 20 MG tablet, Take 20 mg by mouth daily., Disp: , Rfl:  .  Multiple Vitamin (MULTIVITAMIN) tablet, Take 1 tablet by mouth daily., Disp: , Rfl:  .  Oxcarbazepine (TRILEPTAL) 300 MG tablet, Take 150 mg by mouth daily. , Disp: , Rfl:  .  oxyCODONE (OXY IR/ROXICODONE) 5 MG immediate release tablet, Take 1 tablet (5 mg total) by mouth at bedtime., Disp: 30 tablet, Rfl: 0 .  OXYGEN, Inhale into the lungs.,  Disp: , Rfl:  .  pantoprazole (PROTONIX) 40 MG tablet, Take 1 tablet by mouth daily., Disp: , Rfl: 5 .  potassium citrate (UROCIT-K) 10 MEQ (1080 MG) SR tablet, Take 10 mEq by mouth 3 (three) times daily with meals., Disp: , Rfl:  .  gabapentin (NEURONTIN) 100 MG capsule, Take 1-3 capsules (100-300 mg total) by mouth at bedtime. Follow written titration schedule., Disp: 90 capsule, Rfl: 0  ROS  Constitutional: Denies any fever or chills Gastrointestinal: No reported hemesis, hematochezia, vomiting, or acute GI distress Musculoskeletal: Denies any acute onset joint swelling, redness, loss of ROM, or weakness Neurological: No reported episodes of acute onset apraxia, aphasia, dysarthria, agnosia, amnesia, paralysis, loss of coordination, or loss of consciousness  Allergies  Mark Vang is allergic to amlodipine besy-benazepril hcl; amlodipine; and hydrochlorothiazide.  [place]  Drug: Mark Vang  reports that he does not use drugs. Alcohol:  reports that he does not drink alcohol. Tobacco:  reports that he has been smoking cigarettes. He has been smoking about 1.00 pack per day. He has never used smokeless tobacco. Medical:  has a past medical history of COPD (chronic obstructive pulmonary disease) ([place]), Hypertension, and Stroke ([place]). Surgical: Mark Vang  has a past surgical history that includes Knee surgery and Nasal sinus surgery. Family: family history includes Cancer in his father and mother.  Constitutional Exam  General appearance: Well nourished, well developed, and well hydrated. In no apparent acute distress Vitals:   05/30/18 1015  BP: 119/74  Pulse: 80  Resp: 16  Temp: 99 F (37.2 C)  TempSrc: Oral  SpO2: 97%  Weight: 244 lb (110.7 kg)  Height: 6' (1.829 m)   BMI Assessment: Estimated body mass index is 33.09 kg/m as calculated from the following:   Height as of this encounter: 6' (1.829 m).   Weight as of this encounter: 244 lb (110.7 kg).  BMI interpretation  table: BMI level Category Range association with higher incidence of chronic pain  <18 kg/m2 Underweight   18.5-24.9 kg/m2 Ideal body weight   25-29.9 kg/m2 Overweight Increased incidence by 20%  30-34.9 kg/m2 Obese (Class I) Increased incidence by 68%  35-39.9 kg/m2 Severe obesity (Class II) Increased incidence by 136%  >40 kg/m2 Extreme obesity (Class III) Increased incidence by 254%   Patient's current BMI Ideal Body weight  Body mass index is 33.09 kg/m. Ideal body weight: 77.6 kg (171 lb 1.2 oz) Adjusted ideal body weight: 90.8 kg (200 lb 3.9 oz)   BMI Readings from Last 4 Encounters:  05/30/18 33.09 kg/m  05/10/18 34.45 kg/m  04/30/18 34.18 kg/m  04/16/18 33.50 kg/m   Wt Readings from Last 4 Encounters:  05/30/18 244 lb (110.7 kg)  05/10/18 254 lb (115.2 kg)  04/30/18 252 lb (114.3 kg)  04/16/18 247 lb (112 kg)  Psych/Mental status: Alert, oriented x 3 (person, place, & time)       Eyes: PERLA Respiratory: No evidence of acute respiratory distress  Cervical Spine Area Exam  Skin & Axial Inspection: No masses, redness, edema, swelling, or associated skin lesions Alignment: Symmetrical Functional ROM: Improved after treatment      Stability: No instability detected Muscle Tone/Strength: Functionally intact. No obvious neuro-muscular anomalies detected. Sensory (Neurological): Unimpaired Palpation: No palpable anomalies              Upper Extremity (UE) Exam    Side: Right upper extremity  Side: Left upper extremity  Skin & Extremity Inspection: Skin color, temperature, and hair growth are WNL. No peripheral edema or cyanosis. No masses, redness, swelling, asymmetry, or associated skin lesions. No contractures.  Skin & Extremity Inspection: Skin color, temperature, and hair growth are WNL. No peripheral edema or cyanosis. No masses, redness, swelling, asymmetry, or associated skin lesions. No contractures.  Functional ROM: Unrestricted ROM          Functional ROM:  Unrestricted ROM          Muscle Tone/Strength: Functionally intact. No obvious neuro-muscular anomalies detected.  Muscle Tone/Strength: Functionally intact. No obvious neuro-muscular anomalies detected.  Sensory (Neurological): Unimpaired          Sensory (Neurological): Unimpaired          Palpation: No palpable anomalies              Palpation: No palpable anomalies              Provocative Test(s):  Phalen's test: deferred Tinel's test: deferred Apley's scratch test (touch opposite shoulder):  Action 1 (Across chest): deferred Action 2 (Overhead): deferred Action 3 (LB reach): deferred   Provocative Test(s):  Phalen's test: deferred Tinel's test: deferred Apley's scratch test (touch opposite shoulder):  Action 1 (Across chest): deferred Action 2 (Overhead): deferred Action 3 (LB reach): deferred    Thoracic Spine Area Exam  Skin & Axial Inspection: No masses, redness, or swelling Alignment: Symmetrical Functional ROM: Unrestricted ROM Stability: No instability detected Muscle Tone/Strength: Functionally intact. No obvious neuro-muscular anomalies detected. Sensory (Neurological): Unimpaired Muscle strength & Tone: No palpable anomalies  Lumbar Spine Area Exam  Skin & Axial Inspection: No masses, redness, or swelling Alignment: Symmetrical Functional ROM: Unrestricted ROM       Stability: No instability detected Muscle Tone/Strength: Functionally intact. No obvious neuro-muscular anomalies detected. Sensory (Neurological): Unimpaired Palpation: No palpable anomalies       Provocative Tests: Hyperextension/rotation test: deferred today  Lumbar quadrant test (Kemp's test): deferred today       Lateral bending test: deferred today       Patrick's Maneuver: deferred today                   FABER test: deferred today                   S-I anterior distraction/compression test: deferred today         S-I lateral compression test: deferred today         S-I Thigh-thrust  test: deferred today         S-I Gaenslen's test: deferred today          Gait & Posture Assessment  Ambulation: Unassisted Gait: Relatively normal for age and body habitus Posture: WNL   Lower Extremity Exam    Side: Right lower extremity  Side: Left lower extremity  Stability: No instability observed          Stability: No instability observed          Skin & Extremity Inspection: Skin color, temperature, and hair growth are WNL. No peripheral edema or cyanosis. No masses, redness, swelling, asymmetry, or associated skin lesions. No contractures.  Skin & Extremity Inspection: Skin color, temperature, and hair growth are WNL. No peripheral edema or cyanosis. No masses, redness, swelling, asymmetry, or associated skin lesions. No contractures.  Functional ROM: Unrestricted ROM                  Functional ROM: Unrestricted ROM                  Muscle Tone/Strength: Functionally intact. No obvious neuro-muscular anomalies detected.  Muscle Tone/Strength: Functionally intact. No obvious neuro-muscular anomalies detected.  Sensory (Neurological): Unimpaired  Sensory (Neurological): Unimpaired  Palpation: No palpable anomalies  Palpation: No palpable anomalies   Assessment  Primary Diagnosis & Pertinent Problem List: The primary encounter diagnosis was Chronic neck pain (Primary Area of Pain) (Bilateral) (L>R). Diagnoses of Spondylosis without myelopathy or radiculopathy, cervical region, Cervical facet syndrome, Cervicogenic headache, Muscle spasms of neck, Chronic pain syndrome, Chronic musculoskeletal pain, Neurogenic pain, and Abnormal MRI, cervical spine were also pertinent to this visit.  Status Diagnosis  Improving Stable Unimproved 1. Chronic neck pain (Primary Area of Pain) (Bilateral) (L>R)   2. Spondylosis without myelopathy or radiculopathy, cervical region   3. Cervical facet syndrome   4. Cervicogenic headache   5. Muscle spasms of neck   6. Chronic pain syndrome   7. Chronic  musculoskeletal pain   8. Neurogenic pain   9. Abnormal MRI, cervical spine     Problems updated and reviewed during this visit: Problem  Abnormal Mri, Cervical Spine   Disc levels: C2-3: No significant disc displacement, foraminal stenosis, or canal stenosis. C3-4: Minimal disc bulge with ventral thecal sac effacement. No significant foraminal or canal stenosis. C4-5: Small central disc protrusion with ventral thecal sac effacement and minimal anterior cord impingement with cord flattening. No significant foraminal or canal stenosis. C5-6: Disc osteophyte complex with right greater than left uncovertebral and facet hypertrophy. Moderate right and mild left foraminal stenosis. No significant canal stenosis. C6-7: Disc osteophyte complex with bilateral facet and extensive uncovertebral hypertrophy. Severe bilateral foraminal stenosis. No canal stenosis. C7-T1: No significant disc displacement, foraminal stenosis, or canal stenosis.  IMPRESSION: 1. No acute osseous or cord signal abnormality. 2. Cervical spondylosis with multilevel disc and facet degenerative changes predominantly at the C4-5 through  C6-7 levels. 3. C4-5 small central disc protrusion with minimal anterior cord impingement. 4. Severe bilateral C6-7 as well as moderate right and mild left C5-6 foraminal stenosis. 5. No significant canal stenosis.    Plan of Care  Pharmacotherapy (Medications Ordered): Meds ordered this encounter  Medications  . oxyCODONE (OXY IR/ROXICODONE) 5 MG immediate release tablet    Sig: Take 1 tablet (5 mg total) by mouth at bedtime.    Dispense:  30 tablet    Refill:  0    Medication for Chronic Pain (G89.4). [place] STOP ACT - Not applicable. Fill one day early if pharmacy is closed on scheduled refill date.  Do not fill until: 05/30/18 To last until: 06/29/18  . baclofen (LIORESAL) 10 MG tablet    Sig: Take 1-2 tablets (10-20 mg total) by mouth every 6 (six) hours as needed for muscle spasms.     Dispense:  120 tablet    Refill:  0    Do not place this medication, or any other prescription from our practice, on "Automatic Refill". Patient may have prescription filled one day early if pharmacy is closed on scheduled refill date.  . gabapentin (NEURONTIN) 100 MG capsule    Sig: Take 1-3 capsules (100-300 mg total) by mouth at bedtime. Follow written titration schedule.    Dispense:  90 capsule    Refill:  0    Do not place medication on "Automatic Refill". Fill one day early if pharmacy is closed on scheduled refill date.   Medications administered today: Mark Vang had no medications administered during this visit.   Procedure Orders     CERVICAL FACET (MEDIAL BRANCH NERVE BLOCK)  Lab Orders  No laboratory test(s) ordered today   Imaging Orders  No imaging studies ordered today   Referral Orders  No referral(s) requested today    Interventional management options: Planned, scheduled, and/or pending:   Diagnostic bilateral cervical facet block #1 under fluoroscopic guidance and IV sedation    Considering:   Diagnostic bilateral lumbar epidural steroid injection  Diagnostic bilateral lumbar facet nerve block  Possible bilateral lumbar facet radiofrequency ablation  Diagnostic bilateral cervical facet nerve block  Possible bilateral cervical facet frequency ablation  Diagnostic trigger point injections  Diagnostic bilateral intra-articular hip injections  Diagnosticgreater occipital nerve block  Diagnostic bilateral articular thumb injections    Palliative PRN treatment(s):   None at this time   Provider-requested follow-up: Return for Procedure (w/ sedation): (B) C-FCT BLK #1.  Future Appointments  Date Time Provider [place]  05/31/2018  2:30 PM [facility] PT SUB THERAPIST 1 [facility] None  06/04/2018  1:45 PM [facility] SUB THERAPIST 1 [facility] None  06/07/2018 12:45 PM Milinda Pointer, MD [facility] None  06/11/2018  9:45 AM [facility] PT SUB  THERAPIST 1 [facility] None  06/14/2018 11:15 AM [facility] PT SUB THERAPIST 1 [facility] None  06/18/2018 10:30 AM [facility] SUB THERAPIST 1 [facility] None  06/21/2018  9:00 AM [facility] PT SUB THERAPIST 1 [facility] None  06/25/2018  2:30 PM [facility] SUB THERAPIST 1 [facility] None  06/28/2018  2:30 PM [facility] SUB THERAPIST 1 [facility] None  07/02/2018  1:45 PM [facility] SUB THERAPIST 1 [facility] None  07/05/2018  2:15 PM [facility] SUB THERAPIST 1 [facility] None  04/15/2019  1:45 PM [place], Marlowe Kays None   Primary Care Physician: Remi Haggard, FNP Location: [facility] Note by: Gaspar Cola, MD Date: 05/30/2018; Time: 12:55 PM

## 2018-05-29 NOTE — Patient Instructions (Signed)
Access Code: [email or web address]  URL: [email or web address]  Date: 05/29/2018  Prepared by: Lieutenant Diego   Exercises  Neck Rotation - 15 reps - 1 sets - 1x daily - 7x weekly  Seated Isometric Cervical Flexion - 10 reps - 1 sets - 1x daily - 7x weekly  Seated Upper Trapezius Stretch - 3 reps - 1 sets - 20 hold - 1x daily - 7x weekly  Seated Levator Scapulae Stretch - 3 reps - 1 sets - 20 hold - 1x daily - 7x weekly

## 2018-05-29 NOTE — Therapy (Signed)
[facility] [address] [place], [address] Phone: [phone]   Fax:  [phone]  Physical Therapy Treatment  Patient Details  Name: Mark Vang MRN: [id number] Date of Birth: Feb 10, 1961 Referring Provider: Milinda Pointer   Encounter Date: 05/29/2018  PT End of Session - 05/29/18 1456    Visit Number  2    Number of Visits  12    Authorization Type  medicaid, authorized 3    PT Start Time  7902    PT Stop Time  1515    PT Time Calculation (min)  40 min    Activity Tolerance  Patient tolerated treatment well    Behavior During Therapy  [facility] for tasks assessed/performed       Past Medical History:  Diagnosis Date  . COPD (chronic obstructive pulmonary disease) ([place])   . Hypertension   . Stroke [facility])     Past Surgical History:  Procedure Laterality Date  . KNEE SURGERY    . NASAL SINUS SURGERY      There were no vitals filed for this visit.  Subjective Assessment - 05/29/18 1435    Subjective  Patient reports that he has a lot of pain today due to teeth being pulled. States he does have significant neck pain today too. States he took his chicken pen down today and it has been very painful since then.     Currently in Pain?  Yes    Pain Score  9     Pain Orientation  Right;Left;Mid    Pain Descriptors / Indicators  Tightness;Guarding;Grimacing;Spasm    Pain Type  Chronic pain    Pain Onset  More than a month ago       Objective: TTP of UT/levator scap, hypertonic cervical muscles  Treatment: Patient performed exercises with verbal, tactile and visual cues to promote proper technique and posture, goal; decrease muscle tension, improve ROM, decrease pain, improve strength  Manual: 10 mins STM superficial and myofascial techniques utilized. Performed on b/l UT, levator scapulae, cervical paraspinals, rhomboids to decrease tension, pain management, improve soft tissue  integrity.  Therapeutic exercises: Cervical rotation in pain free range x15 with verbal/visual cues from PT UT stretch/Levator stretches 3x20"    Modalities: High volt 100ps, 2sec (~200volts ea)with 4 pads to b/l posterior cervical paraspinals/UT to decrease tension/pain management with moist hot pack to encourage relaxation, improved tissue extensibility  Patient response to treatment: Patient reported decrease in pain from 9/10 to 8/10 and that he felt "looser". Soft tissue integrity improved by 25% post STM.?     PT Education - 05/29/18 1437    Education Details  education of HEP, posture    Person(s) Educated  Patient    Methods  Explanation;Demonstration    Comprehension  Verbalized understanding       PT Short Term Goals - 05/22/18 1542      PT SHORT TERM GOAL #1   Title  Patient will be adherent to HEP at least 3x a week to improve functional strength and ROM  for better ability to perform functional tasks at home.     Time  3    Period  Weeks    Status  New    Target Date  06/12/18        PT Long Term Goals - 05/22/18 1543      PT LONG TERM GOAL #1   Title  Patient will increase BUE gross strength to [facility] as to  improve functional strength for functional tasks, ADLs and recreational activities.    Time  6    Period  Weeks    Status  New    Target Date  07/03/18      PT LONG TERM GOAL #2   Title  Patient will be able to perform household work/ chores without increase in symptoms.    Time  6    Period  Weeks    Status  New    Target Date  07/03/18      PT LONG TERM GOAL #3   Title  Patient will increase ability to perform functional tasks independently with decreased pain and improved functional ability demonstrated with improvement in FOTO score to at least 70 or greater.     Baseline  FOTO:45    Time  6    Period  Weeks    Status  New    Target Date  07/03/18            Plan - 05/29/18 1454    Clinical Impression Statement  Patient demonstrated  improved soft tissue integrity post STM. Multimodal cues needed during exercises for proper form/technique. Intermittently patient complained of cramping, dicussion of proper hydration/electrolytes to encourage proper hydration.     Clinical Impairments Affecting Rehab Potential  decreased cervical ROM, decreased UE strength/function, significant pain, postural abnormalities, impaired soft tissue integrity of b/l cervical musculature    PT Frequency  2x / week    PT Duration  6 weeks    PT Next Visit Plan  continue STM, IASTM, ROM, postural strengthening, stretches     PT Home Exercise Plan  cervical rom in painfree range, cervical flexion isometric: [id number]     Consulted and Agree with Plan of Care  Patient       Patient will benefit from skilled therapeutic intervention in order to improve the following deficits and impairments:  Decreased range of motion, Increased fascial restricitons, Impaired tone, Decreased endurance, Increased muscle spasms, Impaired UE functional use, Decreased activity tolerance, Pain, Postural dysfunction, Decreased strength, Decreased mobility  Visit Diagnosis: Cervicalgia  Other muscle spasm     Problem List Patient Active Problem List   Diagnosis Date Noted  . Chronic musculoskeletal pain 05/10/2018  . Trigger point of neck 05/10/2018  . Osteoarthritis of spine with radiculopathy, cervical region 05/09/2018  . Chronic nonintractable headache 05/03/2018  . Chronic hip pain [facility] Area of Pain) (Right) 04/30/2018  . Chronic neck pain (Primary Area of Pain) (Bilateral) (L>R) 04/30/2018  . DDD (degenerative disc disease), cervical 04/30/2018  . Muscle spasms of neck 04/30/2018  . Opiate use 04/30/2018  . Cervicogenic headache 04/30/2018  . Occipital headache (Bilateral) 04/30/2018  . Cervico-occipital neuralgia (Bilateral) 04/30/2018  . Lumbar facet syndrome (Bilateral) 04/30/2018  . Lumbar facet arthropathy (Bilateral) 04/30/2018  . DDD  (degenerative disc disease), lumbosacral 04/30/2018  . Chronic sacroiliac joint pain (Left) 04/30/2018  . Current tear knee, medial meniscus 04/27/2018  . Osteoarthritis of knee 04/27/2018  . Cervical radiculopathy 04/12/2018  . Chronic lower extremity pain [facility] Area of Pain) (Bilateral) (R>L) 04/02/2018  . Chronic low back pain (Secondary Area of Pain) (Bilateral) (R>L) 04/02/2018  . Chronic pain syndrome 04/02/2018  . Long term current use of opiate analgesic 04/02/2018  . Pharmacologic therapy 04/02/2018  . Disorder of skeletal system 04/02/2018  . Problems influencing health status 04/02/2018  . Seizure ([place]) 07/01/2016  . Sinusitis 07/01/2016  . Stroke ([place]) 07/01/2016  . COPD (chronic obstructive pulmonary disease) ([place])  01/11/2013  . Dyspnea on exertion 01/11/2013  . GERD (gastroesophageal reflux disease) 01/11/2013  . HTN (hypertension) 01/11/2013  . Seizures ([place]) 01/11/2013  . Tobacco abuse 01/11/2013  . Convulsions ([place]) 01/11/2013  . Chronic sinusitis 12/13/2012  . 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 [place], [address] Phone: [phone]   Fax:  [phone]  Name: Mark Vang MRN: [id number] Date of Birth: [birth year]

## 2018-05-30 ENCOUNTER — Encounter: Payer: Self-pay | Admitting: Pain Medicine

## 2018-05-30 ENCOUNTER — Other Ambulatory Visit: Payer: Self-pay

## 2018-05-30 ENCOUNTER — Ambulatory Visit: Payer: Medicaid Other | Attending: Pain Medicine | Admitting: Pain Medicine

## 2018-05-30 VITALS — BP 119/74 | HR 80 | Temp 99.0°F | Resp 16 | Ht 72.0 in | Wt 244.0 lb

## 2018-05-30 DIAGNOSIS — M542 Cervicalgia: Secondary | ICD-10-CM

## 2018-05-30 DIAGNOSIS — M4722 Other spondylosis with radiculopathy, cervical region: Secondary | ICD-10-CM | POA: Insufficient documentation

## 2018-05-30 DIAGNOSIS — Z888 Allergy status to other drugs, medicaments and biological substances status: Secondary | ICD-10-CM | POA: Insufficient documentation

## 2018-05-30 DIAGNOSIS — D386 Neoplasm of uncertain behavior of respiratory organ, unspecified: Secondary | ICD-10-CM | POA: Insufficient documentation

## 2018-05-30 DIAGNOSIS — R51 Headache: Secondary | ICD-10-CM | POA: Diagnosis not present

## 2018-05-30 DIAGNOSIS — Z8673 Personal history of transient ischemic attack (TIA), and cerebral infarction without residual deficits: Secondary | ICD-10-CM | POA: Insufficient documentation

## 2018-05-30 DIAGNOSIS — M7918 Myalgia, other site: Secondary | ICD-10-CM

## 2018-05-30 DIAGNOSIS — M62838 Other muscle spasm: Secondary | ICD-10-CM

## 2018-05-30 DIAGNOSIS — M5030 Other cervical disc degeneration, unspecified cervical region: Secondary | ICD-10-CM | POA: Insufficient documentation

## 2018-05-30 DIAGNOSIS — M79604 Pain in right leg: Secondary | ICD-10-CM | POA: Insufficient documentation

## 2018-05-30 DIAGNOSIS — Z79891 Long term (current) use of opiate analgesic: Secondary | ICD-10-CM | POA: Insufficient documentation

## 2018-05-30 DIAGNOSIS — Z7982 Long term (current) use of aspirin: Secondary | ICD-10-CM | POA: Insufficient documentation

## 2018-05-30 DIAGNOSIS — G894 Chronic pain syndrome: Secondary | ICD-10-CM

## 2018-05-30 DIAGNOSIS — M79605 Pain in left leg: Secondary | ICD-10-CM | POA: Diagnosis not present

## 2018-05-30 DIAGNOSIS — R937 Abnormal findings on diagnostic imaging of other parts of musculoskeletal system: Secondary | ICD-10-CM

## 2018-05-30 DIAGNOSIS — J329 Chronic sinusitis, unspecified: Secondary | ICD-10-CM | POA: Insufficient documentation

## 2018-05-30 DIAGNOSIS — M25551 Pain in right hip: Secondary | ICD-10-CM | POA: Diagnosis not present

## 2018-05-30 DIAGNOSIS — I10 Essential (primary) hypertension: Secondary | ICD-10-CM | POA: Insufficient documentation

## 2018-05-30 DIAGNOSIS — K219 Gastro-esophageal reflux disease without esophagitis: Secondary | ICD-10-CM | POA: Insufficient documentation

## 2018-05-30 DIAGNOSIS — G8929 Other chronic pain: Secondary | ICD-10-CM

## 2018-05-30 DIAGNOSIS — R569 Unspecified convulsions: Secondary | ICD-10-CM | POA: Insufficient documentation

## 2018-05-30 DIAGNOSIS — J449 Chronic obstructive pulmonary disease, unspecified: Secondary | ICD-10-CM | POA: Insufficient documentation

## 2018-05-30 DIAGNOSIS — M50121 Cervical disc disorder at C4-C5 level with radiculopathy: Secondary | ICD-10-CM | POA: Insufficient documentation

## 2018-05-30 DIAGNOSIS — Z79899 Other long term (current) drug therapy: Secondary | ICD-10-CM | POA: Diagnosis not present

## 2018-05-30 DIAGNOSIS — M792 Neuralgia and neuritis, unspecified: Secondary | ICD-10-CM

## 2018-05-30 DIAGNOSIS — M47812 Spondylosis without myelopathy or radiculopathy, cervical region: Secondary | ICD-10-CM

## 2018-05-30 DIAGNOSIS — G4486 Cervicogenic headache: Secondary | ICD-10-CM

## 2018-05-30 MED ORDER — OXYCODONE HCL 5 MG PO TABS: 5.0000 mg | ORAL_TABLET | Freq: Every day | ORAL | 0 refills | Status: DC

## 2018-05-30 MED ORDER — GABAPENTIN 100 MG PO CAPS: 100.0000 mg | ORAL_CAPSULE | Freq: Every day | ORAL | 0 refills | Status: DC

## 2018-05-30 MED ORDER — BACLOFEN 10 MG PO TABS: 10.0000 mg | ORAL_TABLET | Freq: Four times a day (QID) | ORAL | 0 refills | Status: DC | PRN

## 2018-05-30 NOTE — Patient Instructions (Addendum)
____________________________________________________________________________________________  Muscle Spasms & Cramps  Cause:  The most common cause of muscle spasms and cramps is vitamin and/or electrolyte (calcium, potassium, sodium, etc.) deficiencies.  Possible triggers: Sweating - causes loss of electrolytes thru the skin. Steroids - causes loss of electrolytes thru the urine.  Treatment: 1. Gatorade (or any other electrolyte-replenishing drink) - Take 1, 8 oz glass with each meal (3 times a day). 2. OTC (over-the-counter) Magnesium 400 to 500 mg - Take 1 tablet twice a day (one with breakfast and one before bedtime). If you have kidney problems, talk to your primary care physician before taking any Magnesium. 3. Tonic Water with quinine - Take 1, 8 oz glass before bedtime.   ____________________________________________________________________________________________   ____________________________________________________________________________________________  Preparing for Procedure with Sedation  Instructions: . Oral Intake: Do not eat or drink anything for at least 8 hours prior to your procedure. . Transportation: Public transportation is not allowed. Bring an adult driver. The driver must be physically present in our waiting room before any procedure can be started. Marland Kitchen Physical Assistance: Bring an adult physically capable of assisting you, in the event you need help. This adult should keep you company at home for at least 6 hours after the procedure. . Blood Pressure Medicine: Take your blood pressure medicine with a sip of water the morning of the procedure. . Blood thinners: Notify our staff if you are taking any blood thinners. Depending on which one you take, there will be specific instructions on how and when to stop it. . Diabetics on insulin: Notify the staff so that you can be scheduled 1st case in the morning. If your diabetes requires high dose insulin, take only  of  your normal insulin dose the morning of the procedure and notify the staff that you have done so. . Preventing infections: Shower with an antibacterial soap the morning of your procedure. . Build-up your immune system: Take 1000 mg of Vitamin C with every meal (3 times a day) the day prior to your procedure. Marland Kitchen Antibiotics: Inform the staff if you have a condition or reason that requires you to take antibiotics before dental procedures. . Pregnancy: If you are pregnant, call and cancel the procedure. . Sickness: If you have a cold, fever, or any active infections, call and cancel the procedure. . Arrival: You must be in the facility at least 30 minutes prior to your scheduled procedure. . Children: Do not bring children with you. . Dress appropriately: Bring dark clothing that you would not mind if they get stained. . Valuables: Do not bring any jewelry or valuables.  Procedure appointments are reserved for interventional treatments only. Marland Kitchen No Prescription Refills. . No medication changes will be discussed during procedure appointments. . No disability issues will be discussed.  Reasons to call and reschedule or cancel your procedure: (Following these recommendations will minimize the risk of a serious complication.) . Surgeries: Avoid having procedures within 2 weeks of any surgery. (Avoid for 2 weeks before or after any surgery). . Flu Shots: Avoid having procedures within 2 weeks of a flu shots or . (Avoid for 2 weeks before or after immunizations). . Barium: Avoid having a procedure within 7-10 days after having had a radiological study involving the use of radiological contrast. (Myelograms, Barium swallow or enema study). . Heart attacks: Avoid any elective procedures or surgeries for the initial 6 months after a "Myocardial Infarction" (Heart Attack). . Blood thinners: It is imperative that you stop these medications before procedures. Let  us know if you if you take any blood thinner.   . Infection: Avoid procedures during or within two weeks of an infection (including chest colds or gastrointestinal problems). Symptoms associated with infections include: Localized redness, fever, chills, night sweats or profuse sweating, burning sensation when voiding, cough, congestion, stuffiness, runny nose, sore throat, diarrhea, nausea, vomiting, cold or Flu symptoms, recent or current infections. It is specially important if the infection is over the area that we intend to treat. Marland Kitchen Heart and lung problems: Symptoms that may suggest an active cardiopulmonary problem include: cough, chest pain, breathing difficulties or shortness of breath, dizziness, ankle swelling, uncontrolled high or unusually low blood pressure, and/or palpitations. If you are experiencing any of these symptoms, cancel your procedure and contact your primary care physician for an evaluation.  Remember:  Regular Business hours are:  Monday to Thursday 8:00 AM to 4:00 PM  Provider's Schedule: Milinda Pointer, MD:  Procedure days: Tuesday and Thursday 7:30 AM to 4:00 PM  Gillis Santa, MD:  Procedure days: Monday and Wednesday 7:30 AM to 4:00 PM ____________________________________________________________________________________________   ____________________________________________________________________________________________  Medication Rules  Applies to: All patients receiving prescriptions (written or electronic).  Pharmacy of record: Pharmacy where electronic prescriptions will be sent. If written prescriptions are taken to a different pharmacy, please inform the nursing staff. The pharmacy listed in the electronic medical record should be the one where you would like electronic prescriptions to be sent.  Prescription refills: Only during scheduled appointments. Applies to both, written and electronic prescriptions.  NOTE: The following applies primarily to controlled substances (Opioid* Pain  Medications).   Patient's responsibilities: 1. Pain Pills: Bring all pain pills to every appointment (except for procedure appointments). 2. Pill Bottles: Bring pills in original pharmacy bottle. Always bring newest bottle. Bring bottle, even if empty. 3. Medication refills: You are responsible for knowing and keeping track of what medications you need refilled. The day before your appointment, write a list of all prescriptions that need to be refilled. Bring that list to your appointment and give it to the admitting nurse. Prescriptions will be written only during appointments. If you forget a medication, it will not be "Called in", "Faxed", or "electronically sent". You will need to get another appointment to get these prescribed. 4. Prescription Accuracy: You are responsible for carefully inspecting your prescriptions before leaving our office. Have the discharge nurse carefully go over each prescription with you, before taking them home. Make sure that your name is accurately spelled, that your address is correct. Check the name and dose of your medication to make sure it is accurate. Check the number of pills, and the written instructions to make sure they are clear and accurate. Make sure that you are given enough medication to last until your next medication refill appointment. 5. Taking Medication: Take medication as prescribed. Never take more pills than instructed. Never take medication more frequently than prescribed. Taking less pills or less frequently is permitted and encouraged, when it comes to controlled substances (written prescriptions).  6. Inform other Doctors: Always inform, all of your healthcare providers, of all the medications you take. 7. Pain Medication from other Providers: You are not allowed to accept any additional pain medication from any other Doctor or Healthcare provider. There are two exceptions to this rule. (see below) In the event that you require additional pain  medication, you are responsible for notifying us, as stated below. 8. Medication Agreement: You are responsible for carefully reading and following our Medication  Agreement. This must be signed before receiving any prescriptions from our practice. Safely store a copy of your signed Agreement. Violations to the Agreement will result in no further prescriptions. (Additional copies of our Medication Agreement are available upon request.) 9. Laws, Rules, & Regulations: All patients are expected to follow all Federal and [employer], [employer], Rules, [employer]. Ignorance of the Laws does not constitute a valid excuse. The use of any illegal substances is prohibited. 10. Adopted CDC guidelines & recommendations: Target dosing levels will be at or below 60 MME/day. Use of benzodiazepines** is not recommended.  Exceptions: There are only two exceptions to the rule of not receiving pain medications from other Healthcare Providers. 1. Exception #1 (Emergencies): In the event of an emergency (i.e.: accident requiring emergency care), you are allowed to receive additional pain medication. However, you are responsible for: As soon as you are able, call our office [phone], at any time of the day or night, and leave a message stating your name, the date and nature of the emergency, and the name and dose of the medication prescribed. In the event that your call is answered by a member of our staff, make sure to document and save the date, time, and the name of the person that took your information.  2. Exception #2 (Planned Surgery): In the event that you are scheduled by another doctor or dentist to have any type of surgery or procedure, you are allowed (for a period no longer than 30 days), to receive additional pain medication, for the acute post-op pain. However, in this case, you are responsible for picking up a copy of our "Post-op Pain Management for Surgeons" handout, and giving it to your surgeon or  dentist. This document is available at our office, and does not require an appointment to obtain it. Simply go to our office during business hours (Monday-Thursday from 8:00 AM to 4:00 PM) (Friday 8:00 AM to 12:00 Noon) or if you have a scheduled appointment with [place], prior to your surgery, and ask for it by name. In addition, you will need to provide [place] with your name, name of your surgeon, type of surgery, and date of procedure or surgery.  *Opioid medications include: morphine, codeine, oxycodone, oxymorphone, hydrocodone, hydromorphone, meperidine, tramadol, tapentadol, buprenorphine, fentanyl, methadone. **Benzodiazepine medications include: diazepam (Valium), alprazolam (Xanax), clonazepam (Klonopine), lorazepam (Ativan), clorazepate (Tranxene), chlordiazepoxide (Librium), estazolam (Prosom), oxazepam (Serax), temazepam (Restoril), triazolam (Halcion) (Last updated: 11/23/2017) ____________________________________________________________________________________________   ____________________________________________________________________________________________  Medication Recommendations and Reminders  Applies to: All patients receiving prescriptions (written and/or electronic).  Medication Rules & Regulations: These rules and regulations exist for your safety and that of others. They are not flexible and neither are we. Dismissing or ignoring them will be considered "non-compliance" with medication therapy, resulting in complete and irreversible termination of such therapy. (See document titled "Medication Rules" for more details.) In all conscience, because of safety reasons, we cannot continue providing a therapy where the patient does not follow instructions.  Pharmacy of record:   Definition: This is the pharmacy where your electronic prescriptions will be sent.   We do not endorse any particular pharmacy.  You are not restricted in your choice of pharmacy.  The pharmacy listed in  the electronic medical record should be the one where you want electronic prescriptions to be sent.  If you choose to change pharmacy, simply notify our nursing staff of your choice of new pharmacy.  Recommendations:  Keep all of your pain medications in a  safe place, under lock and key, even if you live alone.   After you fill your prescription, take 1 week's worth of pills and put them away in a safe place. You should keep a separate, properly labeled bottle for this purpose. The remainder should be kept in the original bottle. Use this as your primary supply, until it runs out. Once it's gone, then you know that you have 1 week's worth of medicine, and it is time to come in for a prescription refill. If you do this correctly, it is unlikely that you will ever run out of medicine.  To make sure that the above recommendation works, it is very important that you make sure your medication refill appointments are scheduled at least 1 week before you run out of medicine. To do this in an effective manner, make sure that you do not leave the office without scheduling your next medication management appointment. Always ask the nursing staff to show you in your prescription , when your medication will be running out. Then arrange for the receptionist to get you a return appointment, at least 7 days before you run out of medicine. Do not wait until you have 1 or 2 pills left, to come in. This is very poor planning and does not take into consideration that we may need to cancel appointments due to bad weather, sickness, or emergencies affecting our staff.  "Partial Fill": If for any reason your pharmacy does not have enough pills/tablets to completely fill or refill your prescription, do not allow for a "partial fill". You will need a separate prescription to fill the remaining amount, which we will not provide. If the reason for the partial fill is your insurance, you will need to talk to the pharmacist about  payment alternatives for the remaining tablets, but again, do not accept a partial fill.  Prescription refills and/or changes in medication(s):   Prescription refills, and/or changes in dose or medication, will be conducted only during scheduled medication management appointments. (Applies to both, written and electronic prescriptions.)  No refills on procedure days. No medication will be changed or started on procedure days. No changes, adjustments, and/or refills will be conducted on a procedure day. Doing so will interfere with the diagnostic portion of the procedure.  No phone refills. No medications will be "called into the pharmacy".  No Fax refills.  No weekend refills.  No Holliday refills.  No after hours refills.  Remember:  Business hours are:  Monday to Thursday 8:00 AM to 4:00 PM Provider's Schedule: Dionisio David, NP - Appointments are:  Medication management: Monday to Thursday 8:00 AM to 4:00 PM Milinda Pointer, MD - Appointments are:  Medication management: Monday and Wednesday 8:00 AM to 4:00 PM Procedure day: Tuesday and Thursday 7:30 AM to 4:00 PM Gillis Santa, MD - Appointments are:  Medication management: Tuesday and Thursday 8:00 AM to 4:00 PM Procedure day: Monday and Wednesday 7:30 AM to 4:00 PM (Last update: 11/23/2017) ____________________________________________________________________________________________   ____________________________________________________________________________________________  CANNABIDIOL (AKA: CBD Oil or Pills)  Applies to: All patients receiving prescriptions of controlled substances (written and/or electronic).  General Information: Cannabidiol (CBD) was discovered in [birth year]. It is one of some 113 identified cannabinoids in cannabis (Marijuana) plants, accounting for up to 40% of the plant's extract. As of 2018, preliminary clinical research on cannabidiol included studies of anxiety, cognition, movement disorders, and  pain.  Cannabidiol is consummed in multiple ways, including inhalation of cannabis smoke or vapor, as an aerosol  spray into the cheek, and by mouth. It may be supplied as CBD oil containing CBD as the active ingredient (no added tetrahydrocannabinol (THC) or terpenes), a full-plant CBD-dominant hemp extract oil, capsules, dried cannabis, or as a liquid solution. CBD is thought not have the same psychoactivity as THC, and may affect the actions of THC. Studies suggest that CBD may interact with different biological targets, including cannabinoid receptors and other neurotransmitter receptors. As of 2018 the mechanism of action for its biological effects has not been determined.  In the [place], cannabidiol has a limited approval by the Food and Drug Administration (FDA) for treatment of only two types of epilepsy disorders. The side effects of long-term use of the drug include somnolence, decreased appetite, diarrhea, fatigue, malaise, weakness, sleeping problems, and others.  CBD remains a Schedule I drug prohibited for any use.  Legality: Some manufacturers ship CBD products nationally, an illegal action which the FDA has not enforced in 2018, with CBD remaining the subject of an FDA investigational new drug evaluation, and is not considered legal as a dietary supplement or food ingredient as of December 2018. Federal illegality has made it difficult historically to conduct research on CBD. CBD is openly sold in head shops and health food stores in some states where such sales have not been explicitly legalized.  Warning: Because it is not FDA approved for general use or treatment of pain, it is not required to undergo the same manufacturing controls as prescription drugs.  This means that the available cannabidiol (CBD) may be contaminated with THC.  If this is the case, it will trigger a positive urine drug screen (UDS) test for cannabinoids (Marijuana).  Because a positive UDS for illicit  substances is a violation of our medication agreement, your opioid analgesics (pain medicine) may be permanently discontinued. (Last update: 12/14/2017) ____________________________________________________________________________________________   ____________________________________________________________________________________________  Initial Gabapentin Titration  Medication used: Gabapentin (Generic Name) or Neurontin (Brand Name) 100 mg tablets/capsules  Reasons to stop increasing the dose:  Reason 1: You get good relief of symptoms, in which case there is no need to increase the daily dose any further.    Reason 2: You develop some side effects, such as sleeping all of the time, difficulty concentrating, or becoming disoriented, in which case you need to go down on the dose, to the prior level, where you were not experiencing any side effects. Stay on that dose longer, to allow more time for your body to get use it, before attempting to increase it again.   Steps: Step 1: Start by taking 1 (one) tablet at bedtime x 7 (seven) days.  Step 2: After being on 1 (one) tablet for 7 (seven) days, then increase it to 2 (two) tablets at bedtime for another 7 (seven) days.  Step 3: Next, after being on 2 (two) tablets at bedtime for 7 (seven) days, then increase it to 3 (three) tablets at bedtime, and stay on that dose until you see your doctor.  Reasons to stop increasing the dose: Reason 1: You get good relief of symptoms, in which case there is no need to increase the daily dose any further.  Reason 2: You develop some side effects, such as sleeping all of the time, difficulty concentrating, or becoming disoriented, in which case you need to go down on the dose, to the prior level, where you were not experiencing any side effects. Stay on that dose longer, to allow more time for your body to get use  it, before attempting to increase it again.  Endpoint: Once you have reached the maximum  dose you can tolerate without side-effects, contact your physician so as to evaluate the results of the regimen.   Questions: Feel free to contact us for any questions or problems at [phone] ____________________________________________________________________________________________ Preparing for your procedure (without sedation) Instructions: . Oral Intake: Do not eat or drink anything for at least 3 hours prior to your procedure. . Transportation: Unless otherwise stated by your physician, you may drive yourself after the procedure. . Blood Pressure Medicine: Take your blood pressure medicine with a sip of water the morning of the procedure. . Insulin: Take only  of your normal insulin dose. . Preventing infections: Shower with an antibacterial soap the morning of your procedure. . Build-up your immune system: Take 1000 mg of Vitamin C with every meal (3 times a day) the day prior to your procedure. . Pregnancy: If you are pregnant, call and cancel the procedure. . Sickness: If you have a cold, fever, or any active infections, call and cancel the procedure. . Arrival: You must be in the facility at least 30 minutes prior to your scheduled procedure. . Children: Do not bring any children with you. . Dress appropriately: Bring dark clothing that you would not mind if they get stained. . Valuables: Do not bring any jewelry or valuables. Procedure appointments are reserved for interventional treatments only. Marland Kitchen No Prescription Refills. . No medication changes will be discussed during procedure appointments. . No disability issues will be discussed.

## 2018-05-30 NOTE — Progress Notes (Signed)
Nursing Pain Medication Assessment:  Safety precautions to be maintained throughout the outpatient stay will include: orient to surroundings, keep bed in low position, maintain call bell within reach at all times, provide assistance with transfer out of bed and ambulation.  Medication Inspection Compliance: Pill count conducted under aseptic conditions, in front of the patient. Neither the pills nor the bottle was removed from the patient's sight at any time. Once count was completed pills were immediately returned to the patient in their original bottle.  Medication: Oxycodone IR Pill/Patch Count: 0 of 30 pills remain Pill/Patch Appearance: Markings consistent with prescribed medication Bottle Appearance: Standard pharmacy container. Clearly labeled. Filled Date: 08/05 / 2019 Last Medication intake:  Ran out of medicine more than 48 hours ago

## 2018-05-31 ENCOUNTER — Ambulatory Visit: Payer: Medicaid Other

## 2018-05-31 DIAGNOSIS — M542 Cervicalgia: Secondary | ICD-10-CM | POA: Diagnosis not present

## 2018-05-31 DIAGNOSIS — M62838 Other muscle spasm: Secondary | ICD-10-CM

## 2018-05-31 DIAGNOSIS — R937 Abnormal findings on diagnostic imaging of other parts of musculoskeletal system: Secondary | ICD-10-CM | POA: Insufficient documentation

## 2018-05-31 NOTE — Therapy (Signed)
[facility] [address], [place], [address] Phone: [phone]   Fax:  [phone]  Physical Therapy Treatment  Patient Details  Name: Mark Vang MRN: [id number] Date of Birth: 1961/07/03 Referring Provider: Milinda Pointer   Encounter Date: 05/31/2018  PT End of Session - 05/31/18 1430    Visit Number  3    Number of Visits  12    Date for PT Re-Evaluation  07/03/18    Authorization Type  medicaid, authorized 3    PT Start Time  1430    Activity Tolerance  Patient tolerated treatment well    Behavior During Therapy  [facility] for tasks assessed/performed       Past Medical History:  Diagnosis Date  . COPD (chronic obstructive pulmonary disease) ([place])   . Hypertension   . Stroke [facility])     Past Surgical History:  Procedure Laterality Date  . KNEE SURGERY    . NASAL SINUS SURGERY      There were no vitals filed for this visit.  Subjective Assessment - 05/31/18 1434    Subjective  Patient reports that he is not having symptoms down his arms after these PT visits.     Currently in Pain?  Yes    Pain Score  8     Pain Location  Neck    Pain Orientation  Right    Pain Descriptors / Indicators  Tightness;Pressure    Pain Type  Chronic pain    Pain Radiating Towards  head    Pain Onset  More than a month ago       Objective: TTP of UT/levator scap, hypertonic cervical muscles  Treatment: Patient performed exercises with verbal, tactile and visual cues to promote proper technique and posture, goal; decrease muscle tension, improve ROM, decrease pain, improve strength  Manual: 15 mins, goal:   to decrease tension, pain management, improve soft tissue integrity STM superficial, deep and myofascial techniques utilized. Performed on b/l UT, levator scapulae, cervical paraspinals, rhomboids. Suboccipital release x32mins in supine.  Therapeutic exercises: Performed with verbal, tactile and  demonstration by PT goal; improve ROM, improve musculature length,, improve mobility  Cervical rotation in pain free range x20 with verbal/visual cues from PT UT stretch/Levator stretches 3x20"  Cervical flexion isometric 10x3"   Modalities: High volt 100ps, 2sec (~175 volts ea)with 4 pads to b/l posterior cervical paraspinals/UT to decrease tension/pain management with moist hot pack to encourage relaxation, improved tissue extensibility  Patient response to treatment: Patient reported decrease in pain at end of session. Reports improved mobility, STM improved by 25%.    PT Education - 05/31/18 1457    Education Details  HEP, therex technique/proper form    Person(s) Educated  Patient    Methods  Explanation;Demonstration    Comprehension  Verbalized understanding       PT Short Term Goals - 05/22/18 1542      PT SHORT TERM GOAL #1   Title  Patient will be adherent to HEP at least 3x a week to improve functional strength and ROM  for better ability to perform functional tasks at home.     Time  3    Period  Weeks    Status  New    Target Date  06/12/18        PT Long Term Goals - 05/22/18 1543      PT LONG TERM GOAL #1   Title  Patient will increase BUE gross  strength to [facility] as to improve functional strength for functional tasks, ADLs and recreational activities.    Time  6    Period  Weeks    Status  New    Target Date  07/03/18      PT LONG TERM GOAL #2   Title  Patient will be able to perform household work/ chores without increase in symptoms.    Time  6    Period  Weeks    Status  New    Target Date  07/03/18      PT LONG TERM GOAL #3   Title  Patient will increase ability to perform functional tasks independently with decreased pain and improved functional ability demonstrated with improvement in FOTO score to at least 70 or greater.     Baseline  FOTO:45    Time  6    Period  Weeks    Status  New    Target Date  07/03/18            Plan -  05/31/18 1458    Clinical Impression Statement  Patient demonstrated improved cervical mobility compared to previous sessions, as well as less cues needed for cervical stretches. Soft tissue integrity improved from last session as well. The patient would benefit from further skilled PT to continue to progress towards goals.    Clinical Impairments Affecting Rehab Potential  decreased cervical ROM, decreased UE strength/function, significant pain, postural abnormalities, impaired soft tissue integrity of b/l cervical musculature    PT Frequency  2x / week    PT Duration  6 weeks    PT Treatment/Interventions  Neuromuscular re-education;Manual techniques;Moist Heat;Patient/family education;Therapeutic activities;Traction;Ultrasound;Therapeutic exercise;Passive range of motion;Electrical Stimulation;Dry needling    PT Next Visit Plan  continue STM, IASTM, ROM, postural strengthening, stretches     PT Home Exercise Plan  cervical rom in painfree range, cervical flexion isometric: [id number]        Patient will benefit from skilled therapeutic intervention in order to improve the following deficits and impairments:  Decreased range of motion, Increased fascial restricitons, Impaired tone, Decreased endurance, Increased muscle spasms, Impaired UE functional use, Decreased activity tolerance, Pain, Postural dysfunction, Decreased strength, Decreased mobility  Visit Diagnosis: Cervicalgia  Other muscle spasm     Problem List Patient Active Problem List   Diagnosis Date Noted  . Abnormal MRI, cervical spine 05/31/2018  . Neurogenic pain 05/30/2018  . Cervical facet syndrome 05/30/2018  . Spondylosis without myelopathy or radiculopathy, cervical region 05/30/2018  . Chronic musculoskeletal pain 05/10/2018  . Trigger point of neck 05/10/2018  . Osteoarthritis of spine with radiculopathy, cervical region 05/09/2018  . Chronic nonintractable headache 05/03/2018  . Chronic hip pain [facility] Area of  Pain) (Right) 04/30/2018  . Chronic neck pain (Primary Area of Pain) (Bilateral) (L>R) 04/30/2018  . DDD (degenerative disc disease), cervical 04/30/2018  . Muscle spasms of neck 04/30/2018  . Opiate use 04/30/2018  . Cervicogenic headache 04/30/2018  . Occipital headache (Bilateral) 04/30/2018  . Cervico-occipital neuralgia (Bilateral) 04/30/2018  . Lumbar facet syndrome (Bilateral) 04/30/2018  . Lumbar facet arthropathy (Bilateral) 04/30/2018  . DDD (degenerative disc disease), lumbosacral 04/30/2018  . Chronic sacroiliac joint pain (Left) 04/30/2018  . Current tear knee, medial meniscus 04/27/2018  . Osteoarthritis of knee 04/27/2018  . Cervical radiculopathy 04/12/2018  . Chronic lower extremity pain [facility] Area of Pain) (Bilateral) (R>L) 04/02/2018  . Chronic low back pain (Secondary Area of Pain) (Bilateral) (R>L) 04/02/2018  . Chronic pain syndrome 04/02/2018  .  Long term current use of opiate analgesic 04/02/2018  . Pharmacologic therapy 04/02/2018  . Disorder of skeletal system 04/02/2018  . Problems influencing health status 04/02/2018  . Seizure ([place]) 07/01/2016  . Sinusitis 07/01/2016  . Stroke ([place]) 07/01/2016  . COPD (chronic obstructive pulmonary disease) ([place]) 01/11/2013  . Dyspnea on exertion 01/11/2013  . GERD (gastroesophageal reflux disease) 01/11/2013  . HTN (hypertension) 01/11/2013  . Seizures ([place]) 01/11/2013  . Tobacco abuse 01/11/2013  . Convulsions ([place]) 01/11/2013  . Chronic sinusitis 12/13/2012  . 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 [place], [address] Phone: [phone]   Fax:  [phone]  Name: Rasool Rommel MRN: [id number] Date of Birth: 29-Nov-1960

## 2018-06-04 ENCOUNTER — Ambulatory Visit: Payer: Medicaid Other

## 2018-06-04 DIAGNOSIS — M542 Cervicalgia: Secondary | ICD-10-CM

## 2018-06-04 DIAGNOSIS — M62838 Other muscle spasm: Secondary | ICD-10-CM
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
## 2018-06-07 ENCOUNTER — Ambulatory Visit
Admission: RE | Admit: 2018-06-07 | Discharge: 2018-06-07 | Disposition: A | Discharge status: Home / Self Care | Payer: Medicaid Other | Source: Ambulatory Visit | Attending: Pain Medicine | Admitting: Pain Medicine

## 2018-06-07 ENCOUNTER — Encounter: Payer: Self-pay | Admitting: Pain Medicine

## 2018-06-07 ENCOUNTER — Ambulatory Visit: Payer: Medicaid Other | Admitting: Pain Medicine

## 2018-06-07 VITALS — BP 101/79 | HR 77 | Temp 98.5°F | Resp 16 | Ht 72.0 in | Wt 240.0 lb

## 2018-06-07 DIAGNOSIS — R51 Headache: Secondary | ICD-10-CM | POA: Insufficient documentation

## 2018-06-07 DIAGNOSIS — G8929 Other chronic pain: Secondary | ICD-10-CM

## 2018-06-07 DIAGNOSIS — R937 Abnormal findings on diagnostic imaging of other parts of musculoskeletal system: Secondary | ICD-10-CM

## 2018-06-07 DIAGNOSIS — M47812 Spondylosis without myelopathy or radiculopathy, cervical region: Secondary | ICD-10-CM | POA: Insufficient documentation

## 2018-06-07 DIAGNOSIS — M5030 Other cervical disc degeneration, unspecified cervical region: Secondary | ICD-10-CM | POA: Insufficient documentation

## 2018-06-07 DIAGNOSIS — M542 Cervicalgia: Secondary | ICD-10-CM

## 2018-06-07 DIAGNOSIS — Z79899 Other long term (current) drug therapy: Secondary | ICD-10-CM | POA: Diagnosis not present

## 2018-06-07 DIAGNOSIS — G4486 Cervicogenic headache: Secondary | ICD-10-CM

## 2018-06-07 DIAGNOSIS — Z7982 Long term (current) use of aspirin: Secondary | ICD-10-CM | POA: Insufficient documentation

## 2018-06-07 DIAGNOSIS — M5481 Occipital neuralgia: Secondary | ICD-10-CM | POA: Diagnosis not present

## 2018-06-07 DIAGNOSIS — Z888 Allergy status to other drugs, medicaments and biological substances status: Secondary | ICD-10-CM | POA: Diagnosis not present

## 2018-06-07 DIAGNOSIS — Z79891 Long term (current) use of opiate analgesic: Secondary | ICD-10-CM | POA: Diagnosis not present

## 2018-06-07 MED ORDER — DEXAMETHASONE SODIUM PHOSPHATE 10 MG/ML IJ SOLN
20.0000 mg | Freq: Once | INTRAMUSCULAR | Status: AC
  Administered 2018-06-07: 20 mg
  Filled 2018-06-07: qty 2

## 2018-06-07 MED ORDER — LACTATED RINGERS IV SOLN: 1000.0000 mL | Freq: Once | INTRAVENOUS | Status: DC

## 2018-06-07 MED ORDER — LIDOCAINE HCL 2 % IJ SOLN
20.0000 mL | Freq: Once | INTRAMUSCULAR | Status: AC
  Administered 2018-06-07: 400 mg
  Filled 2018-06-07: qty 40

## 2018-06-07 MED ORDER — MIDAZOLAM HCL 5 MG/5ML IJ SOLN: 1.0000 mg | INTRAMUSCULAR | Status: DC | PRN

## 2018-06-07 MED ORDER — ROPIVACAINE HCL 2 MG/ML IJ SOLN
18.0000 mL | Freq: Once | INTRAMUSCULAR | Status: AC
  Administered 2018-06-07: 18 mL via PERINEURAL
  Filled 2018-06-07: qty 20

## 2018-06-07 MED ORDER — FENTANYL CITRATE (PF) 100 MCG/2ML IJ SOLN: 25.0000 ug | INTRAMUSCULAR | Status: DC | PRN

## 2018-06-07 NOTE — Progress Notes (Signed)
Patient's Name: Mark Vang  MRN: [id number]  Referring Provider: Remi Haggard, FNP  DOB: [birth year]  PCP: Remi Haggard, FNP  DOS: 06/07/2018  Note by: Gaspar Cola, MD  Service setting: Ambulatory outpatient  Specialty: Interventional Pain Management  Patient type: Established  Location: [facility] (AMB) Pain Management Facility  Visit type: Interventional Procedure   Primary Reason for Visit: Interventional Pain Management Treatment. CC: Neck Pain (bilateral )  Procedure:          Anesthesia, Analgesia, Anxiolysis:  Type: Cervical Facet Medial Branch Block(s)  #1  Primary Purpose: Diagnostic Region: Posterolateral cervical spine Level: C3, C4, C5, C6, & C7 Medial Branch Level(s). Injecting these levels blocks the C3-4, C4-5, C5-6, and C6-7 cervical facet joints. Laterality: Bilateral  Type: Moderate (Conscious) Sedation combined with Local Anesthesia Indication(s): Analgesia and Anxiety Route: Intravenous (IV) IV Access: Secured Sedation: Meaningful verbal contact was maintained at all times during the procedure  Local Anesthetic: Lidocaine 1-2%   Indications: 1. Spondylosis without myelopathy or radiculopathy, cervical region   2. Cervical facet hypertrophy (Bilateral)   3. Cervical facet syndrome   4. DDD (degenerative disc disease), cervical   5. Cervicalgia   6. Chronic neck pain (Primary Area of Pain) (Bilateral) (L>R)   7. Cervicogenic headache   8. Cervico-occipital neuralgia (Bilateral)   9. Abnormal MRI, cervical spine (05/17/2018)    Pain Score: Pre-procedure: 9 /10 Post-procedure: 5 /10  Pre-op Assessment:  Mark Vang is a 57 y.o. (year old), male patient, seen today for interventional treatment. He  has a past surgical history that includes Knee surgery and Nasal sinus surgery. [name] has a current medication list which includes the following prescription(s): amlodipine, aspirin, atorvastatin, baclofen, cholecalciferol, dexlansoprazole,  fluticasone, fluticasone-umeclidin-vilant, gabapentin, lisinopril, multivitamin, nortriptyline, oxycodone, oxygen-helium, pantoprazole, potassium citrate, and oxcarbazepine. His primarily concern today is the Neck Pain (bilateral )  Initial Vital Signs:  Pulse/HCG Rate: 77ECG Heart Rate: 86 Temp: 98.5 F (36.9 C) Resp: 16 BP: (!) 114/91 SpO2: 98 %  BMI: Estimated body mass index is 32.55 kg/m as calculated from the following:   Height as of this encounter: 6' (1.829 m).   Weight as of this encounter: 240 lb (108.9 kg).  Risk Assessment: Allergies: Reviewed. He is allergic to amlodipine besy-benazepril hcl; amlodipine; and hydrochlorothiazide.  Allergy Precautions: None required Coagulopathies: Reviewed. None identified.  Blood-thinner therapy: None at this time Active Infection(s): Reviewed. None identified. Mark Vang is afebrile  Site Confirmation: Mark Vang was asked to confirm the procedure and laterality before marking the site Procedure checklist: Completed Consent: Before the procedure and under the influence of no sedative(s), amnesic(s), or anxiolytics, the patient was informed of the treatment options, risks and possible complications. To fulfill our ethical and legal obligations, as recommended by the American Medical Association's Code of Ethics, I have informed the patient of my clinical impression; the nature and purpose of the treatment or procedure; the risks, benefits, and possible complications of the intervention; the alternatives, including doing nothing; the risk(s) and benefit(s) of the alternative treatment(s) or procedure(s); and the risk(s) and benefit(s) of doing nothing. The patient was provided information about the general risks and possible complications associated with the procedure. These may include, but are not limited to: failure to achieve desired goals, infection, bleeding, organ or nerve damage, allergic reactions, paralysis, and death. In addition,  the patient was informed of those risks and complications associated to Spine-related procedures, such as failure to decrease pain; infection (i.e.: Meningitis, epidural or intraspinal  abscess); bleeding (i.e.: epidural hematoma, subarachnoid hemorrhage, or any other type of intraspinal or peri-dural bleeding); organ or nerve damage (i.e.: Any type of peripheral nerve, nerve root, or spinal cord injury) with subsequent damage to sensory, motor, and/or autonomic systems, resulting in permanent pain, numbness, and/or weakness of one or several areas of the body; allergic reactions; (i.e.: anaphylactic reaction); and/or death. Furthermore, the patient was informed of those risks and complications associated with the medications. These include, but are not limited to: allergic reactions (i.e.: anaphylactic or anaphylactoid reaction(s)); adrenal axis suppression; blood sugar elevation that in diabetics may result in ketoacidosis or comma; water retention that in patients with history of congestive heart failure may result in shortness of breath, pulmonary edema, and decompensation with resultant heart failure; weight gain; swelling or edema; medication-induced neural toxicity; particulate matter embolism and blood vessel occlusion with resultant organ, and/or nervous system infarction; and/or aseptic necrosis of one or more joints. Finally, the patient was informed that Medicine is not an exact science; therefore, there is also the possibility of unforeseen or unpredictable risks and/or possible complications that may result in a catastrophic outcome. The patient indicated having understood very clearly. We have given the patient no guarantees and we have made no promises. Enough time was given to the patient to ask questions, all of which were answered to the patient's satisfaction. Mark Vang has indicated that he wanted to continue with the procedure. Attestation: I, the ordering provider, attest that I have  discussed with the patient the benefits, risks, side-effects, alternatives, likelihood of achieving goals, and potential problems during recovery for the procedure that I have provided informed consent. Date  Time: 06/07/2018 12:49 PM  Pre-Procedure Preparation:  Monitoring: As per clinic protocol. Respiration, ETCO2, SpO2, BP, heart rate and rhythm monitor placed and checked for adequate function Safety Precautions: Patient was assessed for positional comfort and pressure points before starting the procedure. Time-out: I initiated and conducted the "Time-out" before starting the procedure, as per protocol. The patient was asked to participate by confirming the accuracy of the "Time Out" information. Verification of the correct person, site, and procedure were performed and confirmed by me, the nursing staff, and the patient. "Time-out" conducted as per Joint Commission's Universal Protocol (UP.01.01.01). Time: 1343  Description of Procedure:          Position: Prone with head of the table raised to facilitate breathing. Laterality: Bilateral. The procedure was performed in identical fashion on both sides. Level: C3, C4, C5, C6, & C7 Medial Branch Level(s). Area Prepped: Posterior Cervico-thoracic Region Prepping solution: ChloraPrep (2% chlorhexidine gluconate and 70% isopropyl alcohol) Safety Precautions: Aspiration looking for blood return was conducted prior to all injections. At no point did we inject any substances, as a needle was being advanced. Before injecting, the patient was told to immediately notify me if he was experiencing any new onset of "ringing in the ears, or metallic taste in the mouth". No attempts were made at seeking any paresthesias. Safe injection practices and needle disposal techniques used. Medications properly checked for expiration dates. SDV (single dose vial) medications used. After the completion of the procedure, all disposable equipment used was discarded in the  proper designated medical waste containers. Local Anesthesia: Protocol guidelines were followed. The patient was positioned over the fluoroscopy table. The area was prepped in the usual manner. The time-out was completed. The target area was identified using fluoroscopy. A 12-in long, straight, sterile hemostat was used with fluoroscopic guidance to locate the targets for  each level blocked. Once located, the skin was marked with an approved surgical skin marker. Once all sites were marked, the skin (epidermis, dermis, and hypodermis), as well as deeper tissues (fat, connective tissue and muscle) were infiltrated with a small amount of a short-acting local anesthetic, loaded on a 10cc syringe with a 25G, 1.5-in  Needle. An appropriate amount of time was allowed for local anesthetics to take effect before proceeding to the next step. Local Anesthetic: Lidocaine 2.0% The unused portion of the local anesthetic was discarded in the proper designated containers. Technical explanation of process:  C3 Medial Branch Nerve Block (MBB): The target area for the C3 dorsal medial articular branch is the lateral concave waist of the articular pillar of C3. Under fluoroscopic guidance, a Quincke needle was inserted until contact was made with os over the postero-lateral aspect of the articular pillar of C3 (target area). After negative aspiration for blood, 0.5 mL of the nerve block solution was injected without difficulty or complication. The needle was removed intact. C4 Medial Branch Nerve Block (MBB): The target area for the C4 dorsal medial articular branch is the lateral concave waist of the articular pillar of C4. Under fluoroscopic guidance, a Quincke needle was inserted until contact was made with os over the postero-lateral aspect of the articular pillar of C4 (target area). After negative aspiration for blood, 0.5 mL of the nerve block solution was injected without difficulty or complication. The needle was removed  intact. C5 Medial Branch Nerve Block (MBB): The target area for the C5 dorsal medial articular branch is the lateral concave waist of the articular pillar of C5. Under fluoroscopic guidance, a Quincke needle was inserted until contact was made with os over the postero-lateral aspect of the articular pillar of C5 (target area). After negative aspiration for blood, 0.5 mL of the nerve block solution was injected without difficulty or complication. The needle was removed intact. C6 Medial Branch Nerve Block (MBB): The target area for the C6 dorsal medial articular branch is the lateral concave waist of the articular pillar of C6. Under fluoroscopic guidance, a Quincke needle was inserted until contact was made with os over the postero-lateral aspect of the articular pillar of C6 (target area). After negative aspiration for blood, 0.5 mL of the nerve block solution was injected without difficulty or complication. The needle was removed intact. C7 Medial Branch Nerve Block (MBB): The target for the C7 dorsal medial articular branch lies on the superior-medial tip of the C7 transverse process. Under fluoroscopic guidance, a Quincke needle was inserted until contact was made with os over the postero-lateral aspect of the articular pillar of C7 (target area). After negative aspiration for blood, 0.5 mL of the nerve block solution was injected without difficulty or complication. The needle was removed intact. Procedural Needles: 22-gauge, 3.5-inch, Quincke needles used for all levels. Nerve block solution: 0.2% PF-Ropivacaine + Triamcinolone (40 mg/mL) diluted to a final concentration of 4 mg of Triamcinolone/mL of Ropivacaine The unused portion of the solution was discarded in the proper designated containers.  Once the entire procedure was completed, the treated area was cleaned, making sure to leave some of the prepping solution back to take advantage of its long term bactericidal properties.  Vitals:   06/07/18  1347 06/07/18 1352 06/07/18 1357 06/07/18 1359  BP: 110/63 112/70 102/72 101/79  Pulse:      Resp: 14 17 17 16   Temp:      TempSrc:      SpO2: 94%  94% 96% 96%  Weight:      Height:        Start Time: 1343 hrs. End Time: 1358 hrs.  Imaging Guidance (Spinal):          Type of Imaging Technique: Fluoroscopy Guidance (Spinal) Indication(s): Assistance in needle guidance and placement for procedures requiring needle placement in or near specific anatomical locations not easily accessible without such assistance. Exposure Time: Please see nurses notes. Contrast: None used. Fluoroscopic Guidance: I was personally present during the use of fluoroscopy. "Tunnel Vision Technique" used to obtain the best possible view of the target area. Parallax error corrected before commencing the procedure. "Direction-depth-direction" technique used to introduce the needle under continuous pulsed fluoroscopy. Once target was reached, antero-posterior, oblique, and lateral fluoroscopic projection used confirm needle placement in all planes. Images permanently stored in EMR. Interpretation: No contrast injected. I personally interpreted the imaging intraoperatively. Adequate needle placement confirmed in multiple planes. Permanent images saved into the patient's record.  Antibiotic Prophylaxis:   Anti-infectives (From admission, onward)   None     Indication(s): None identified  Post-operative Assessment:  Post-procedure Vital Signs:  Pulse/HCG Rate: 7786 Temp: 98.5 F (36.9 C) Resp: 16 BP: 101/79 SpO2: 96 %  EBL: None  Complications: No immediate post-treatment complications observed by team, or reported by patient.  Note: The patient tolerated the entire procedure well. A repeat set of vitals were taken after the procedure and the patient was kept under observation following institutional policy, for this type of procedure. Post-procedural neurological assessment was performed, showing return to  baseline, prior to discharge. The patient was provided with post-procedure discharge instructions, including a section on how to identify potential problems. Should any problems arise concerning this procedure, the patient was given instructions to immediately contact us, at any time, without hesitation. In any case, we plan to contact the patient by telephone for a follow-up status report regarding this interventional procedure.  Comments:  No additional relevant information.  Plan of Care    Imaging Orders     DG C-Arm 1-60 Min-No Report  Procedure Orders     CERVICAL FACET (MEDIAL BRANCH NERVE BLOCK)   Medications ordered for procedure: Meds ordered this encounter  Medications  . lidocaine (XYLOCAINE) 2 % (with pres) injection 400 mg  . DISCONTD: midazolam (VERSED) 5 MG/5ML injection 1-2 mg    Make sure Flumazenil is available in the pyxis when using this medication. If oversedation occurs, administer 0.2 mg IV over 15 sec. If after 45 sec no response, administer 0.2 mg again over 1 min; may repeat at 1 min intervals; not to exceed 4 doses (1 mg)  . DISCONTD: fentaNYL (SUBLIMAZE) injection 25-50 mcg    Make sure Narcan is available in the pyxis when using this medication. In the event of respiratory depression (RR< 8/min): Titrate NARCAN (naloxone) in increments of 0.1 to 0.2 mg IV at 2-3 minute intervals, until desired degree of reversal.  . DISCONTD: lactated ringers infusion 1,000 mL  . ropivacaine (PF) 2 mg/mL (0.2%) (NAROPIN) injection 18 mL  . dexamethasone (DECADRON) injection 20 mg   Medications administered: We administered lidocaine, ropivacaine (PF) 2 mg/mL (0.2%), and dexamethasone.  See the medical record for exact dosing, route, and time of administration.  New Prescriptions   No medications on file   Disposition: Discharge home  Discharge Date & Time: 06/07/2018; 1415 hrs.   Physician-requested Follow-up: Return for post-procedure eval (2 wks), w/ Dr.  Dossie Arbour.  Future Appointments  Date Time Provider  [place]  06/11/2018  9:45 AM [facility] PT SUB THERAPIST 1 [facility] None  06/18/2018 10:30 AM [facility] PT SUB THERAPIST 1 [facility] None  06/20/2018  1:45 PM Milinda Pointer, MD [facility] None  06/21/2018  9:00 AM [facility] PT SUB THERAPIST 1 [facility] None  06/25/2018  2:30 PM [facility] PT SUB THERAPIST 1 [facility] None  06/28/2018  2:30 PM [facility] PT SUB THERAPIST 1 [facility] None  07/02/2018  1:45 PM [facility] PT SUB THERAPIST 1 [facility] None  07/05/2018  2:15 PM [facility] PT SUB THERAPIST 1 [facility] None  04/15/2019  1:45 PM McGowan, Marlowe Kays None   Primary Care Physician: Remi Haggard, FNP Location: [facility] Note by: Gaspar Cola, MD Date: 06/07/2018; Time: 2:39 PM  Disclaimer:  Medicine is not an Chief Strategy Officer. The only guarantee in medicine is that nothing is guaranteed. It is important to note that the decision to proceed with this intervention was based on the information collected from the patient. The Data and conclusions were drawn from the patient's questionnaire, the interview, and the physical examination. Because the information was provided in large part by the patient, it cannot be guaranteed that it has not been purposely or unconsciously manipulated. Every effort has been made to obtain as much relevant data as possible for this evaluation. It is important to note that the conclusions that lead to this procedure are derived in large part from the available data. Always take into account that the treatment will also be dependent on availability of resources and existing treatment guidelines, considered by other Pain Management Practitioners as being common knowledge and practice, at the time of the intervention. For Medico-Legal purposes, it is also important to point out that variation in procedural techniques and pharmacological choices are the acceptable norm. The  indications, contraindications, technique, and results of the above procedure should only be interpreted and judged by a Board-Certified Interventional Pain Specialist with extensive familiarity and expertise in the same exact procedure and technique.

## 2018-06-07 NOTE — Progress Notes (Signed)
Safety precautions to be maintained throughout the outpatient stay will include: orient to surroundings, keep bed in low position, maintain call bell within reach at all times, provide assistance with transfer out of bed and ambulation.  

## 2018-06-07 NOTE — Patient Instructions (Addendum)
____________________________________________________________________________________________  Post-Procedure Discharge Instructions  Instructions:  Apply ice: Fill a plastic sandwich bag with crushed ice. Cover it with a small towel and apply to injection site. Apply for 15 minutes then remove x 15 minutes. Repeat sequence on day of procedure, until you go to bed. The purpose is to minimize swelling and discomfort after procedure.  Apply heat: Apply heat to procedure site starting the day following the procedure. The purpose is to treat any soreness and discomfort from the procedure.  Food intake: Start with clear liquids (like water) and advance to regular food, as tolerated.   Physical activities: Keep activities to a minimum for the first 8 hours after the procedure.   Driving: If you have received any sedation, you are not allowed to drive for 24 hours after your procedure.  Blood thinner: Restart your blood thinner 6 hours after your procedure. (Only for those taking blood thinners)  Insulin: As soon as you can eat, you may resume your normal dosing schedule. (Only for those taking insulin)  Infection prevention: Keep procedure site clean and dry.  Post-procedure Pain Diary: Extremely important that this be done correctly and accurately. Recorded information will be used to determine the next step in treatment.  Pain evaluated is that of treated area only. Do not include pain from an untreated area.  Complete every hour, on the hour, for the initial 8 hours. Set an alarm to help you do this part accurately.  Do not go to sleep and have it completed later. It will not be accurate.  Follow-up appointment: Keep your follow-up appointment after the procedure. Usually 2 weeks for most procedures. (6 weeks in the case of radiofrequency.) Bring you pain diary.   Expect:  From numbing medicine (AKA: Local Anesthetics): Numbness or decrease in pain.  Onset: Full effect within 15  minutes of injected.  Duration: It will depend on the type of local anesthetic used. On the average, 1 to 8 hours.   From steroids: Decrease in swelling or inflammation. Once inflammation is improved, relief of the pain will follow.  Onset of benefits: Depends on the amount of swelling present. The more swelling, the longer it will take for the benefits to be seen. In some cases, up to 10 days.  Duration: Steroids will stay in the system x 2 weeks. Duration of benefits will depend on multiple posibilities including persistent irritating factors.  Occasional side-effects: Facial flushing, cramps (if present, drink Gatorade and take over-the-counter Magnesium 450-500 mg once to twice a day).  From procedure: Some discomfort is to be expected once the numbing medicine wears off. This should be minimal if ice and heat are applied as instructed.  Call if:  You experience numbness and weakness that gets worse with time, as opposed to wearing off.  New onset bowel or bladder incontinence. (This applies to Spinal procedures only)  Emergency Numbers:  Durning business hours (Monday - Thursday, 8:00 AM - 4:00 PM) (Friday, 9:00 AM - 12:00 Noon): [phone]  After hours: [phone] ____________________________________________________________________________________________   ____________________________________________________________________________________________  Pain Scale  Introduction: The pain score used by this practice is the Verbal Numerical Rating Scale (VNRS-11). This is an 11-point scale. It is for adults and children 10 years or older. There are significant differences in how the pain score is reported, used, and applied. Forget everything you learned in the past and learn this scoring system.  General Information: The scale should reflect your current level of pain. Unless you are specifically asked   for the level of your worst pain, or your average pain. If you are asked  for one of these two, then it should be understood that it is over the past 24 hours.  Basic Activities of Daily Living (ADL): Personal hygiene, dressing, eating, transferring, and using restroom.  Instructions: Most patients tend to report their level of pain as a combination of two factors, their physical pain and their psychosocial pain. This last one is also known as "suffering" and it is reflection of how physical pain affects you socially and psychologically. From now on, report them separately. From this point on, when asked to report your pain level, report only your physical pain. Use the following table for reference.  Pain Clinic Pain Levels (0-5/10)  Pain Level Score  Description  No Pain 0   Mild pain 1 Nagging, annoying, but does not interfere with basic activities of daily living (ADL). Patients are able to eat, bathe, get dressed, toileting (being able to get on and off the toilet and perform personal hygiene functions), transfer (move in and out of bed or a chair without assistance), and maintain continence (able to control bladder and bowel functions). Blood pressure and heart rate are unaffected. A normal heart rate for a healthy adult ranges from 60 to 100 bpm (beats per minute).   Mild to moderate pain 2 Noticeable and distracting. Impossible to hide from other people. More frequent flare-ups. Still possible to adapt and function close to normal. It can be very annoying and may have occasional stronger flare-ups. With discipline, patients may get used to it and adapt.   Moderate pain 3 Interferes significantly with activities of daily living (ADL). It becomes difficult to feed, bathe, get dressed, get on and off the toilet or to perform personal hygiene functions. Difficult to get in and out of bed or a chair without assistance. Very distracting. With effort, it can be ignored when deeply involved in activities.   Moderately severe pain 4 Impossible to ignore for more than a few  minutes. With effort, patients may still be able to manage work or participate in some social activities. Very difficult to concentrate. Signs of autonomic nervous system discharge are evident: dilated pupils (mydriasis); mild sweating (diaphoresis); sleep interference. Heart rate becomes elevated (>115 bpm). Diastolic blood pressure (lower number) rises above 100 mmHg. Patients find relief in laying down and not moving.   Severe pain 5 Intense and extremely unpleasant. Associated with frowning face and frequent crying. Pain overwhelms the senses.  Ability to do any activity or maintain social relationships becomes significantly limited. Conversation becomes difficult. Pacing back and forth is common, as getting into a comfortable position is nearly impossible. Pain wakes you up from deep sleep. Physical signs will be obvious: pupillary dilation; increased sweating; goosebumps; brisk reflexes; cold, clammy hands and feet; nausea, vomiting or dry heaves; loss of appetite; significant sleep disturbance with inability to fall asleep or to remain asleep. When persistent, significant weight loss is observed due to the complete loss of appetite and sleep deprivation.  Blood pressure and heart rate becomes significantly elevated. Caution: If elevated blood pressure triggers a pounding headache associated with blurred vision, then the patient should immediately seek attention at an urgent or emergency care unit, as these may be signs of an impending stroke.    Emergency Department Pain Levels (6-10/10)  Emergency Room Pain 6 Severely limiting. Requires emergency care and should not be seen or managed at an outpatient pain management facility. Communication becomes difficult  and requires great effort. Assistance to reach the emergency department may be required. Facial flushing and profuse sweating along with potentially dangerous increases in heart rate and blood pressure will be evident.   Distressing pain 7  Self-care is very difficult. Assistance is required to transport, or use restroom. Assistance to reach the emergency department will be required. Tasks requiring coordination, such as bathing and getting dressed become very difficult.   Disabling pain 8 Self-care is no longer possible. At this level, pain is disabling. The individual is unable to do even the most "basic" activities such as walking, eating, bathing, dressing, transferring to a bed, or toileting. Fine motor skills are lost. It is difficult to think clearly.   Incapacitating pain 9 Pain becomes incapacitating. Thought processing is no longer possible. Difficult to remember your own name. Control of movement and coordination are lost.   The worst pain imaginable 10 At this level, most patients pass out from pain. When this level is reached, collapse of the autonomic nervous system occurs, leading to a sudden drop in blood pressure and heart rate. This in turn results in a temporary and dramatic drop in blood flow to the brain, leading to a loss of consciousness. Fainting is one of the body's self defense mechanisms. Passing out puts the brain in a calmed state and causes it to shut down for a while, in order to begin the healing process.    Summary: 1. Refer to this scale when providing [place] with your pain level. 2. Be accurate and careful when reporting your pain level. This will help with your care. 3. Over-reporting your pain level will lead to loss of credibility. 4. Even a level of 1/10 means that there is pain and will be treated at our facility. 5. High, inaccurate reporting will be documented as "Symptom Exaggeration", leading to loss of credibility and suspicions of possible secondary gains such as obtaining more narcotics, or wanting to appear disabled, for fraudulent reasons. 6. Only pain levels of 5 or below will be seen at our facility. 7. Pain levels of 6 and above will be sent to the Emergency Department and the appointment  cancelled. ____________________________________________________________________________________________   Facet Blocks Patient Information  Description: The facets are joints in the spine between the vertebrae.  Like any joints in the body, facets can become irritated and painful.  Arthritis can also effect the facets.  By injecting steroids and local anesthetic in and around these joints, we can temporarily block the nerve supply to them.  Steroids act directly on irritated nerves and tissues to reduce selling and inflammation which often leads to decreased pain.  Facet blocks may be done anywhere along the spine from the neck to the low back depending upon the location of your pain.   After numbing the skin with local anesthetic (like Novocaine), a small needle is passed onto the facet joints under x-ray guidance.  You may experience a sensation of pressure while this is being done.  The entire block usually lasts about 15-25 minutes.   Conditions which may be treated by facet blocks:   Low back/buttock pain  Neck/shoulder pain  Certain types of headaches  Preparation for the injection:  1. Do not eat any solid food or dairy products within 8 hours of your appointment. 2. You may drink clear liquid up to 3 hours before appointment.  Clear liquids include water, black coffee, juice or soda.  No milk or cream please. 3. You may take your regular medication,  including pain medications, with a sip of water before your appointment.  Diabetics should hold regular insulin (if taken separately) and take 1/2 normal NPH dose the morning of the procedure.  Carry some sugar containing items with you to your appointment. 4. A driver must accompany you and be prepared to drive you home after your procedure. 5. Bring all your current medications with you. 6. An IV may be inserted and sedation may be given at the discretion of the physician. 7. A blood pressure cuff, EKG and other monitors will often be  applied during the procedure.  Some patients may need to have extra oxygen administered for a short period. 8. You will be asked to provide medical information, including your allergies and medications, prior to the procedure.  We must know immediately if you are taking blood thinners (like Coumadin/Warfarin) or if you are allergic to IV iodine contrast (dye).  We must know if you could possible be pregnant.  Possible side-effects:   Bleeding from needle site  Infection (rare, may require surgery)  Nerve injury (rare)  Numbness & tingling (temporary)  Difficulty urinating (rare, temporary)  Spinal headache (a headache worse with upright posture)  Light-headedness (temporary)  Pain at injection site (serveral days)  Decreased blood pressure (rare, temporary)  Weakness in arm/leg (temporary)  Pressure sensation in back/neck (temporary)   Call if you experience:   Fever/chills associated with headache or increased back/neck pain  Headache worsened by an upright position  New onset, weakness or numbness of an extremity below the injection site  Hives or difficulty breathing (go to the emergency room)  Inflammation or drainage at the injection site(s)  Severe back/neck pain greater than usual  New symptoms which are concerning to you  Please note:  Although the local anesthetic injected can often make your back or neck feel good for several hours after the injection, the pain will likely return. It takes 3-7 days for steroids to work.  You may not notice any pain relief for at least one week.  If effective, we will often do a series of 2-3 injections spaced 3-6 weeks apart to maximally decrease your pain.  After the initial series, you may be a candidate for a more permanent nerve block of the facets.  If you have any questions, please call #[phone]) [facility]  General Discharge  Instructions :  If you need to reach your doctor call: Monday-Friday 8:00 am - 4:00 pm at [phone] or toll free [phone].  After clinic hours [phone] to have operator reach doctor.  Bring all of your medication bottles to all your appointments in the pain clinic.  To cancel or reschedule your appointment with Pain Management please remember to call 24 hours in advance to avoid a fee.  Refer to the educational materials which you have been given on: General Risks, I had my Procedure. Discharge Instructions, Post Sedation.  Post Procedure Instructions:  The drugs you were given will stay in your system until tomorrow, so for the next 24 hours you should not drive, make any legal decisions or drink any alcoholic beverages.  You may eat anything you prefer, but it is better to start with liquids then soups and crackers, and gradually work up to solid foods.  Please notify your doctor immediately if you have any unusual bleeding, trouble breathing or pain that is not related to your normal pain.  Depending on the type of procedure that was done,  some parts of your body may feel week and/or numb.  This usually clears up by tonight or the next day.  Walk with the use of an assistive device or accompanied by an adult for the 24 hours.  You may use ice on the affected area for the first 24 hours.  Put ice in a Ziploc bag and cover with a towel and place against area 15 minutes on 15 minutes off.  You may switch to heat after 24 hours.

## 2018-06-08 ENCOUNTER — Telehealth: Payer: Self-pay

## 2018-06-08 NOTE — Telephone Encounter (Signed)
Patient was called, no problems reported. 

## 2018-06-11 ENCOUNTER — Ambulatory Visit: Payer: Medicaid Other

## 2018-06-13 ENCOUNTER — Other Ambulatory Visit: Payer: Self-pay | Admitting: Neurology

## 2018-06-13 ENCOUNTER — Ambulatory Visit: Payer: Medicaid Other

## 2018-06-13 DIAGNOSIS — M62838 Other muscle spasm: Secondary | ICD-10-CM

## 2018-06-13 DIAGNOSIS — R519 Headache, unspecified: Secondary | ICD-10-CM | POA: Insufficient documentation

## 2018-06-13 DIAGNOSIS — G4452 New daily persistent headache (NDPH): Secondary | ICD-10-CM

## 2018-06-13 DIAGNOSIS — M542 Cervicalgia: Secondary | ICD-10-CM

## 2018-06-13 DIAGNOSIS — R51 Headache: Secondary | ICD-10-CM
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
## 2018-06-18 ENCOUNTER — Ambulatory Visit: Payer: Medicaid Other

## 2018-06-18 NOTE — Progress Notes (Addendum)
Patient's Name: Mark Vang  MRN: [id number]  Referring Provider: Remi Haggard, FNP  DOB: [birth year]  PCP: Remi Haggard, FNP  DOS: 06/20/2018  Note by: Gaspar Cola, MD  Service setting: Ambulatory outpatient  Specialty: Interventional Pain Management  Location: [facility] (AMB) Pain Management Facility    Patient type: Established   Primary Reason(s) for Visit: Encounter for post-procedure evaluation of chronic illness with mild to moderate exacerbation CC: Headache  HPI  Mark Vang is a 57 y.o. year old, male patient, who comes today for a post-procedure evaluation. 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; Seizure ([place]); Seizures ([place]); Sinusitis; Stroke ([place]); Tobacco abuse; Chronic lower extremity pain (Tertiary Area of Pain) (Bilateral) (R>L); Chronic low back pain (Secondary Area of Pain) (Bilateral) (R>L); Chronic pain syndrome; Long term current use of opiate analgesic; Pharmacologic therapy; Disorder of skeletal system; Problems influencing health status; Current tear knee, medial meniscus; Cervical radiculopathy; Osteoarthritis of knee; Convulsions ([place]); Chronic hip pain [facility] Area of Pain) (Right); Chronic neck pain (Primary Area of Pain) (Bilateral) (L>R); DDD (degenerative disc disease), cervical; Muscle spasms of neck; Opiate use; Cervicogenic headache; Occipital headache (Bilateral); Cervico-occipital neuralgia (Bilateral); Lumbar facet syndrome (Bilateral); Lumbar facet arthropathy (Bilateral); DDD (degenerative disc disease), lumbosacral; Chronic sacroiliac joint pain (Left); Osteoarthritis of spine with radiculopathy, cervical region; Chronic musculoskeletal pain; Trigger point of neck; Chronic nonintractable headache; Neurogenic pain; Cervical facet syndrome (Bilateral) (L>R); Spondylosis without  myelopathy or radiculopathy, cervical region; Abnormal MRI, cervical spine (05/17/2018); Cervicalgia; Cervical facet hypertrophy (Bilateral); and Headache disorder on their problem list. His primarily concern today is the Headache  Pain Assessment: Location: Mid Head Radiating: head causing excruciating headache Onset: More than a month ago Duration: Chronic pain Quality: Aching, Throbbing, Constant Severity: 9 /10 (subjective, self-reported pain score)  Note: Reported level is inconsistent with clinical observations. Clinically the patient looks like a 2/10 A 2/10 is viewed as "Mild to Moderate" and described as noticeable and distracting. Impossible to hide from other people. More frequent flare-ups. Still possible to adapt and function close to normal. It can be very annoying and may have occasional stronger flare-ups. With discipline, patients may get used to it and adapt. Information on the proper use of the pain scale provided to the patient today. When using our objective Pain Scale, levels between 6 and 10/10 are said to belong in an emergency room, as it progressively worsens from a 6/10, described as severely limiting, requiring emergency care not usually available at an outpatient pain management facility. At a 6/10 level, communication becomes difficult and requires great effort. Assistance to reach the emergency department may be required. Facial flushing and profuse sweating along with potentially dangerous increases in heart rate and blood pressure will be evident. Effect on ADL: limits everything i do Timing: Constant Modifying factors: Medications BP: 103/66  HR: 84  Mark Vang comes in today for post-procedure evaluation after the treatment done on 06/07/2018.  On 05/10/2018 the patient underwent a left-sided diagnostic cervical epidural steroid injection #1 + a left-sided trapezius muscle trigger point injection under fluoroscopic guidance, no sedation.  On the day of the procedure  he indicated that his pain was a 9/10 and after the procedure he again indicated it to still be in 9/10.  On 05/30/2018, he return for a follow-up evaluation and at that time he indicated his short-term relief of the pain to have been  60% for the initial 6 hours followed by 50-70% improvement, primarily in the area of the neck, but not the headaches.  On 06/07/2018 he underwent a diagnostic bilateral cervical facet block #1 under fluoroscopic guidance + IV sedation.  The patient's preprocedure pain score was described to be a 9/10 followed by a 5/10 after the procedure.  Today he returns for follow-up evaluation.   To begin with, clearly the patient does not understand the pain scoring system as he is not using the specialty pain scale that we have provided him with.  In addition, I have the feeling that he is including all other areas that were not treating.  Today we will be going over all of this so that he understands better how to assess the treatments so that we can get better information out of each one of them.  Further details on both, my assessment(s), as well as the proposed treatment plan, please see below.  Post-Procedure Assessment  06/07/2018 Procedure: Diagnostic bilateral cervical facet block #1 under fluoroscopic guidance and IV sedation Pre-procedure pain score:  9/10 Post-procedure pain score: 5/10 (< 50% relief) Influential Factors: BMI: 32.55 kg/m Intra-procedural challenges: None observed.         Assessment challenges: None detected.              Reported side-effects: None.        Post-procedural adverse reactions or complications: None reported         Sedation: Sedation provided. When no sedatives are used, the analgesic levels obtained are directly associated to the effectiveness of the local anesthetics. However, when sedation is provided, the level of analgesia obtained during the initial 1 hour following the intervention, is believed to be the result of a combination of  factors. These factors may include, but are not limited to: 1. The effectiveness of the local anesthetics used. 2. The effects of the analgesic(s) and/or anxiolytic(s) used. 3. The degree of discomfort experienced by the patient at the time of the procedure. 4. The patients ability and reliability in recalling and recording the events. 5. The presence and influence of possible secondary gains and/or psychosocial factors. Reported result: Relief experienced during the 1st hour after the procedure: 100 % (Ultra-Short Term Relief) Mr. Melhorn has indicated area to have been numb during this time. Interpretative annotation: Clinically appropriate result. Analgesia during this period is likely to be Local Anesthetic and/or IV Sedative (Analgesic/Anxiolytic) related.          Effects of local anesthetic: The analgesic effects attained during this period are directly associated to the localized infiltration of local anesthetics and therefore cary significant diagnostic value as to the etiological location, or anatomical origin, of the pain. Expected duration of relief is directly dependent on the pharmacodynamics of the local anesthetic used. Long-acting (4-6 hours) anesthetics used.  Reported result: Relief during the next 4 to 6 hour after the procedure: 100 % (Short-Term Relief) Mr. Rhines has indicated area to have been numb during this time. Interpretative annotation: Clinically appropriate result. Analgesia during this period is likely to be Local Anesthetic-related.          Long-term benefit: Defined as the period of time past the expected duration of local anesthetics (1 hour for short-acting and 4-6 hours for long-acting). With the possible exception of prolonged sympathetic blockade from the local anesthetics, benefits during this period are typically attributed to, or associated with, other factors such as analgesic sensory neuropraxia, antiinflammatory effects, or beneficial biochemical changes  provided  by agents other than the local anesthetics.  Reported result: Extended relief following procedure: 100 %(my neck tighten up at time,  I use ice pack to use.) (Long-Term Relief)            Interpretative annotation: Clinically possible results. Good relief. No permanent benefit expected. Inflammation plays a part in the etiology to the pain. Etiology is likely inflammatory and mechanical.  Current benefits: Defined as reported results that persistent at this point in time.   Analgesia: 50-75 % Mr. Dobek reports improvement of axial symptoms.  However, he still experiencing the headaches and he is beginning to have some recurrence of the pain on the left side of the Cervical. Function: Mr. Bowland reports improvement in function ROM: Somewhat improved Interpretative annotation: Ongoing benefit. Therapeutic benefit observed. Effective therapeutic approach.          Interpretation: Results would suggest a successful diagnostic intervention.                  Plan:  Consider diagnostic procedure No.: 2          Laboratory Chemistry  Inflammation Markers (CRP: Acute Phase) (ESR: Chronic Phase) Lab Results  Component Value Date   CRP <0.8 04/02/2018   ESRSEDRATE 2 04/02/2018                         Renal Markers Lab Results  Component Value Date   BUN 27 (H) 04/16/2018   CREATININE 1.12 04/16/2018   BCR 11 03/13/2018   GFRAA >60 04/16/2018   GFRNONAA >60 04/16/2018                             Hepatic Markers Lab Results  Component Value Date   AST 25 04/16/2018   ALT 29 04/16/2018   ALBUMIN 4.2 04/16/2018                        Neuropathy Markers Lab Results  Component Value Date   VITAMINB12 186 04/02/2018   HGBA1C 6.0 06/29/2012                        Hematology Parameters Lab Results  Component Value Date   INR 0.91 04/16/2018   LABPROT 12.2 04/16/2018   APTT 28 04/16/2018   PLT 297 04/16/2018   HGB 14.0 04/16/2018   HCT 42.2 04/16/2018                         CV Markers Lab Results  Component Value Date   CKTOTAL 43 06/28/2012   CKMB < 0.5 (L) 06/28/2012   TROPONINI <0.03 04/16/2018                         Note: Lab results reviewed.  Recent Imaging Results   Results for orders placed in visit on 06/07/18  DG C-Arm 1-60 Min-No Report   Narrative Fluoroscopy was utilized by the requesting physician.  No radiographic  interpretation.    Interpretation Report: Fluoroscopy was used during the procedure to assist with needle guidance. The images were interpreted intraoperatively by the requesting physician.  Meds   Current Outpatient Medications:  .  amLODipine (NORVASC) 5 MG tablet, Take 5 mg by mouth daily., Disp: , Rfl:  .  aspirin 325 MG EC tablet, Take 325 mg by  mouth daily., Disp: , Rfl:  .  atorvastatin (LIPITOR) 40 MG tablet, Take 40 mg by mouth daily., Disp: , Rfl:  .  [START ON 06/29/2018] baclofen (LIORESAL) 10 MG tablet, Take 1-2 tablets (10-20 mg total) by mouth every 6 (six) hours as needed for muscle spasms., Disp: 120 tablet, Rfl: 0 .  cholecalciferol (VITAMIN D) 1000 units tablet, Take 1,000 Units by mouth daily., Disp: , Rfl:  .  dexlansoprazole (DEXILANT) 60 MG capsule, Take 60 mg by mouth daily., Disp: , Rfl:  .  fluticasone (FLONASE) 50 MCG/ACT nasal spray, Place 1 spray into both nostrils daily. , Disp: , Rfl:  .  Fluticasone-Umeclidin-Vilant (TRELEGY ELLIPTA) 100-62.5-25 MCG/INH AEPB, Inhale into the lungs., Disp: , Rfl:  .  [START ON 06/29/2018] gabapentin (NEURONTIN) 300 MG capsule, Take 1-3 capsules (300-900 mg total) by mouth at bedtime. Follow written titration schedule., Disp: 90 capsule, Rfl: 0 .  lisinopril (PRINIVIL,ZESTRIL) 20 MG tablet, Take 20 mg by mouth daily., Disp: , Rfl:  .  Multiple Vitamin (MULTIVITAMIN) tablet, Take 1 tablet by mouth daily., Disp: , Rfl:  .  nortriptyline (PAMELOR) 10 MG capsule, Take 10 mg by mouth at bedtime. Patient will titrate up to 20 mg after 7 days., Disp: , Rfl: 3 .   Oxcarbazepine (TRILEPTAL) 300 MG tablet, Take 150 mg by mouth daily. , Disp: , Rfl:  .  [START ON 06/29/2018] oxyCODONE (OXY IR/ROXICODONE) 5 MG immediate release tablet, Take 1 tablet (5 mg total) by mouth at bedtime., Disp: 30 tablet, Rfl: 0 .  OXYGEN, Inhale into the lungs., Disp: , Rfl:  .  pantoprazole (PROTONIX) 40 MG tablet, Take 1 tablet by mouth daily., Disp: , Rfl: 5 .  potassium citrate (UROCIT-K) 10 MEQ (1080 MG) SR tablet, Take 10 mEq by mouth 3 (three) times daily with meals., Disp: , Rfl:   ROS  Constitutional: Denies any fever or chills Gastrointestinal: No reported hemesis, hematochezia, vomiting, or acute GI distress Musculoskeletal: Denies any acute onset joint swelling, redness, loss of ROM, or weakness Neurological: No reported episodes of acute onset apraxia, aphasia, dysarthria, agnosia, amnesia, paralysis, loss of coordination, or loss of consciousness  Allergies  Mr. Nicotra is allergic to amlodipine besy-benazepril hcl; amlodipine; and hydrochlorothiazide.  [place]  Drug: Mr. Carswell  reports that he does not use drugs. Alcohol:  reports that he does not drink alcohol. Tobacco:  reports that he has been smoking cigarettes. He has been smoking about 1.00 pack per day. He has never used smokeless tobacco. Medical:  has a past medical history of COPD (chronic obstructive pulmonary disease) ([place]), Hypertension, and Stroke ([place]). Surgical: Mr. Encinas  has a past surgical history that includes Knee surgery and Nasal sinus surgery. Family: family history includes Cancer in his father and mother.  Constitutional Exam  General appearance: Well nourished, well developed, and well hydrated. In no apparent acute distress Vitals:   06/20/18 1406  BP: 103/66  Pulse: 84  Temp: 98.2 F (36.8 C)  SpO2: 96%  Weight: 240 lb (108.9 kg)  Height: 6' (1.829 m)   BMI Assessment: Estimated body mass index is 32.55 kg/m as calculated from the following:   Height as of this  encounter: 6' (1.829 m).   Weight as of this encounter: 240 lb (108.9 kg).  BMI interpretation table: BMI level Category Range association with higher incidence of chronic pain  <18 kg/m2 Underweight   18.5-24.9 kg/m2 Ideal body weight   25-29.9 kg/m2 Overweight Increased incidence by 20%  30-34.9 kg/m2  Obese (Class I) Increased incidence by 68%  35-39.9 kg/m2 Severe obesity (Class II) Increased incidence by 136%  >40 kg/m2 Extreme obesity (Class III) Increased incidence by 254%   Patient's current BMI Ideal Body weight  Body mass index is 32.55 kg/m. Ideal body weight: 77.6 kg (171 lb 1.2 oz) Adjusted ideal body weight: 90.1 kg (198 lb 10.3 oz)   BMI Readings from Last 4 Encounters:  06/20/18 32.55 kg/m  06/07/18 32.55 kg/m  05/30/18 33.09 kg/m  05/10/18 34.45 kg/m   Wt Readings from Last 4 Encounters:  06/20/18 240 lb (108.9 kg)  06/07/18 240 lb (108.9 kg)  05/30/18 244 lb (110.7 kg)  05/10/18 254 lb (115.2 kg)  Psych/Mental status: Alert, oriented x 3 (person, place, & time)       Eyes: PERLA Respiratory: No evidence of acute respiratory distress  Cervical Spine Area Exam  Skin & Axial Inspection: No masses, redness, edema, swelling, or associated skin lesions Alignment: Symmetrical Functional ROM: Improved after treatment      Stability: No instability detected Muscle Tone/Strength: Functionally intact. No obvious neuro-muscular anomalies detected. Sensory (Neurological): Movement-associated pain Palpation: Complains of area being tender to palpation              Upper Extremity (UE) Exam    Side: Right upper extremity  Side: Left upper extremity  Skin & Extremity Inspection: Skin color, temperature, and hair growth are WNL. No peripheral edema or cyanosis. No masses, redness, swelling, asymmetry, or associated skin lesions. No contractures.  Skin & Extremity Inspection: Skin color, temperature, and hair growth are WNL. No peripheral edema or cyanosis. No masses,  redness, swelling, asymmetry, or associated skin lesions. No contractures.  Functional ROM: Unrestricted ROM          Functional ROM: Unrestricted ROM          Muscle Tone/Strength: Functionally intact. No obvious neuro-muscular anomalies detected.  Muscle Tone/Strength: Functionally intact. No obvious neuro-muscular anomalies detected.  Sensory (Neurological): Unimpaired          Sensory (Neurological): Unimpaired          Palpation: No palpable anomalies              Palpation: No palpable anomalies              Provocative Test(s):  Phalen's test: deferred Tinel's test: deferred Apley's scratch test (touch opposite shoulder):  Action 1 (Across chest): deferred Action 2 (Overhead): deferred Action 3 (LB reach): deferred   Provocative Test(s):  Phalen's test: deferred Tinel's test: deferred Apley's scratch test (touch opposite shoulder):  Action 1 (Across chest): deferred Action 2 (Overhead): deferred Action 3 (LB reach): deferred    Thoracic Spine Area Exam  Skin & Axial Inspection: No masses, redness, or swelling Alignment: Symmetrical Functional ROM: Unrestricted ROM Stability: No instability detected Muscle Tone/Strength: Functionally intact. No obvious neuro-muscular anomalies detected. Sensory (Neurological): Unimpaired Muscle strength & Tone: No palpable anomalies  Lumbar Spine Area Exam  Skin & Axial Inspection: No masses, redness, or swelling Alignment: Symmetrical Functional ROM: Unrestricted ROM       Stability: No instability detected Muscle Tone/Strength: Functionally intact. No obvious neuro-muscular anomalies detected. Sensory (Neurological): Unimpaired Palpation: No palpable anomalies       Provocative Tests: Hyperextension/rotation test: deferred today       Lumbar quadrant test (Kemp's test): deferred today       Lateral bending test: deferred today       Patrick's Maneuver: deferred today  FABER test: deferred today                    S-I anterior distraction/compression test: deferred today         S-I lateral compression test: deferred today         S-I Thigh-thrust test: deferred today         S-I Gaenslen's test: deferred today          Gait & Posture Assessment  Ambulation: Unassisted Gait: Relatively normal for age and body habitus Posture: WNL   Lower Extremity Exam    Side: Right lower extremity  Side: Left lower extremity  Stability: No instability observed          Stability: No instability observed          Skin & Extremity Inspection: Skin color, temperature, and hair growth are WNL. No peripheral edema or cyanosis. No masses, redness, swelling, asymmetry, or associated skin lesions. No contractures.  Skin & Extremity Inspection: Skin color, temperature, and hair growth are WNL. No peripheral edema or cyanosis. No masses, redness, swelling, asymmetry, or associated skin lesions. No contractures.  Functional ROM: Unrestricted ROM                  Functional ROM: Unrestricted ROM                  Muscle Tone/Strength: Functionally intact. No obvious neuro-muscular anomalies detected.  Muscle Tone/Strength: Functionally intact. No obvious neuro-muscular anomalies detected.  Sensory (Neurological): Unimpaired  Sensory (Neurological): Unimpaired  Palpation: No palpable anomalies  Palpation: No palpable anomalies   Assessment  Primary Diagnosis & Pertinent Problem List: The primary encounter diagnosis was Cervicalgia. Diagnoses of Cervicogenic headache, Cervical facet syndrome (Bilateral) (L>R), Cervical facet hypertrophy (Bilateral), Cervico-occipital neuralgia (Bilateral), DDD (degenerative disc disease), cervical, Chronic neck pain (Primary Area of Pain) (Bilateral) (L>R), Chronic low back pain (Secondary Area of Pain) (Bilateral) (R>L), Chronic lower extremity pain (Tertiary Area of Pain) (Bilateral) (R>L), Chronic hip pain (Tertiary Area of Pain) (Right), Muscle spasms of neck, Chronic musculoskeletal pain,  Chronic pain syndrome, and Neurogenic pain were also pertinent to this visit.  Status Diagnosis  Improving Unimproved Improved 1. Cervicalgia   2. Cervicogenic headache   3. Cervical facet syndrome (Bilateral) (L>R)   4. Cervical facet hypertrophy (Bilateral)   5. Cervico-occipital neuralgia (Bilateral)   6. DDD (degenerative disc disease), cervical   7. Chronic neck pain (Primary Area of Pain) (Bilateral) (L>R)   8. Chronic low back pain (Secondary Area of Pain) (Bilateral) (R>L)   9. Chronic lower extremity pain (Tertiary Area of Pain) (Bilateral) (R>L)   10. Chronic hip pain [facility] Area of Pain) (Right)   11. Muscle spasms of neck   12. Chronic musculoskeletal pain   13. Chronic pain syndrome   14. Neurogenic pain     Problems updated and reviewed during this visit: No problems updated.   Time Note: Greater than 50% of the 40 minute(s) of face-to-face time spent with Mr. Brosh, was spent in counseling/coordination of care regarding: the appropriate use of the pain scale, Mr. Sedler's primary cause of pain, the results of his recent test(s), the treatment plan, treatment alternatives, the risks and possible complications of proposed treatment, the results, interpretation and significance of  his recent diagnostic interventional treatment(s), realistic expectations, the goals of pain management (increased in functionality), the importance of providing [place] with accurate post-procedure information and the need to collect and read the AVS material.  Plan  of Care  Pharmacotherapy (Medications Ordered): Meds ordered this encounter  Medications  . baclofen (LIORESAL) 10 MG tablet    Sig: Take 1-2 tablets (10-20 mg total) by mouth every 6 (six) hours as needed for muscle spasms.    Dispense:  120 tablet    Refill:  0    Do not place this medication, or any other prescription from our practice, on "Automatic Refill". Patient may have prescription filled one day early if pharmacy is  closed on scheduled refill date.  Marland Kitchen oxyCODONE (OXY IR/ROXICODONE) 5 MG immediate release tablet    Sig: Take 1 tablet (5 mg total) by mouth at bedtime.    Dispense:  30 tablet    Refill:  0    Medication for Chronic Pain (G89.4). Irene STOP ACT - Not applicable. Fill one day early if pharmacy is closed on scheduled refill date. Do not fill until: 06/29/18. To last until: 07/318  . gabapentin (NEURONTIN) 300 MG capsule    Sig: Take 1-3 capsules (300-900 mg total) by mouth at bedtime. Follow written titration schedule.    Dispense:  90 capsule    Refill:  0    Do not place medication on "Automatic Refill". Fill one day early if pharmacy is closed on scheduled refill date.   Medications administered today: Harvir Patry had no medications administered during this visit.   Procedure Orders     CERVICAL FACET (MEDIAL BRANCH NERVE BLOCK)  Lab Orders  No laboratory test(s) ordered today   Imaging Orders  No imaging studies ordered today   Referral Orders  No referral(s) requested today   Interventional management options: Planned, scheduled, and/or pending:   Diagnostic bilateral cervical facet block #2 under fluoroscopic guidance and IV sedation   Considering:   Diagnostic bilateral lumbar epidural steroid injection Diagnostic bilateral lumbar facet nerve block Possible bilateral lumbar facet RFA Diagnostic bilateral cervical facet nerve block Possible bilateral cervical facet RFA  Diagnostic trigger point injections Diagnostic bilateral intra-articular hip injections Diagnosticgreater occipital nerve block Diagnostic bilateral articular thumb injections   Palliative PRN treatment(s):   None at this time   Provider-requested follow-up: Return for Procedure (w/ sedation): (B) C-FCT BLK #2.  Future Appointments  Date Time Provider [place]  06/21/2018  9:00 AM [facility] PT SUB THERAPIST 1 [facility] None  06/25/2018  2:30 PM [facility] PT SUB THERAPIST 1 [facility]  None  06/28/2018 10:00 AM [facility]  06/28/2018  2:30 PM [facility] PT SUB THERAPIST 1 [facility] None  07/02/2018  1:45 PM [facility] PT SUB THERAPIST 1 [facility] None  07/03/2018  9:45 AM Milinda Pointer, MD [facility] None  07/05/2018  2:15 PM [facility] PT SUB THERAPIST 1 [facility] None  07/10/2018  8:00 AM [facility] PT SUB THERAPIST 1 [facility] None  07/12/2018  8:00 AM [facility] PT SUB THERAPIST 1 [facility] None  07/17/2018  1:00 PM [facility] PT SUB THERAPIST 1 [facility] None  07/19/2018  3:15 PM [facility] SUB THERAPIST 1 [facility] None  07/24/2018  1:00 PM [facility] PT SUB THERAPIST 1 [facility] None  07/26/2018  4:45 PM [facility] PT SUB THERAPIST 1 [facility] None  04/15/2019  1:45 PM McGowan, Marlowe Kays None   Primary Care Physician: Remi Haggard, FNP Location: [facility] Note by: Gaspar Cola, MD Date: 06/20/2018; Time: 3:03 PM

## 2018-06-19 NOTE — Patient Instructions (Addendum)
____________________________________________________________________________________________  Pain Scale  Introduction: The pain score used by this practice is the Verbal Numerical Rating Scale (VNRS-11). This is an 11-point scale. It is for adults and children 10 years or older. There are significant differences in how the pain score is reported, used, and applied. Forget everything you learned in the past and learn this scoring system.  General Information: The scale should reflect your current level of pain. Unless you are specifically asked for the level of your worst pain, or your average pain. If you are asked for one of these two, then it should be understood that it is over the past 24 hours.  Basic Activities of Daily Living (ADL): Personal hygiene, dressing, eating, transferring, and using restroom.  Instructions: Most patients tend to report their level of pain as a combination of two factors, their physical pain and their psychosocial pain. This last one is also known as "suffering" and it is reflection of how physical pain affects you socially and psychologically. From now on, report them separately. From this point on, when asked to report your pain level, report only your physical pain. Use the following table for reference.  Pain Clinic Pain Levels (0-5/10)  Pain Level Score  Description  No Pain 0   Mild pain 1 Nagging, annoying, but does not interfere with basic activities of daily living (ADL). Patients are able to eat, bathe, get dressed, toileting (being able to get on and off the toilet and perform personal hygiene functions), transfer (move in and out of bed or a chair without assistance), and maintain continence (able to control bladder and bowel functions). Blood pressure and heart rate are unaffected. A normal heart rate for a healthy adult ranges from 60 to 100 bpm (beats per minute).   Mild to moderate pain 2 Noticeable and distracting. Impossible to hide from other  people. More frequent flare-ups. Still possible to adapt and function close to normal. It can be very annoying and may have occasional stronger flare-ups. With discipline, patients may get used to it and adapt.   Moderate pain 3 Interferes significantly with activities of daily living (ADL). It becomes difficult to feed, bathe, get dressed, get on and off the toilet or to perform personal hygiene functions. Difficult to get in and out of bed or a chair without assistance. Very distracting. With effort, it can be ignored when deeply involved in activities.   Moderately severe pain 4 Impossible to ignore for more than a few minutes. With effort, patients may still be able to manage work or participate in some social activities. Very difficult to concentrate. Signs of autonomic nervous system discharge are evident: dilated pupils (mydriasis); mild sweating (diaphoresis); sleep interference. Heart rate becomes elevated (>115 bpm). Diastolic blood pressure (lower number) rises above 100 mmHg. Patients find relief in laying down and not moving.   Severe pain 5 Intense and extremely unpleasant. Associated with frowning face and frequent crying. Pain overwhelms the senses.  Ability to do any activity or maintain social relationships becomes significantly limited. Conversation becomes difficult. Pacing back and forth is common, as getting into a comfortable position is nearly impossible. Pain wakes you up from deep sleep. Physical signs will be obvious: pupillary dilation; increased sweating; goosebumps; brisk reflexes; cold, clammy hands and feet; nausea, vomiting or dry heaves; loss of appetite; significant sleep disturbance with inability to fall asleep or to remain asleep. When persistent, significant weight loss is observed due to the complete loss of appetite and sleep deprivation.  Blood   pressure and heart rate becomes significantly elevated. Caution: If elevated blood pressure triggers a pounding headache  associated with blurred vision, then the patient should immediately seek attention at an urgent or emergency care unit, as these may be signs of an impending stroke.    Emergency Department Pain Levels (6-10/10)  Emergency Room Pain 6 Severely limiting. Requires emergency care and should not be seen or managed at an outpatient pain management facility. Communication becomes difficult and requires great effort. Assistance to reach the emergency department may be required. Facial flushing and profuse sweating along with potentially dangerous increases in heart rate and blood pressure will be evident.   Distressing pain 7 Self-care is very difficult. Assistance is required to transport, or use restroom. Assistance to reach the emergency department will be required. Tasks requiring coordination, such as bathing and getting dressed become very difficult.   Disabling pain 8 Self-care is no longer possible. At this level, pain is disabling. The individual is unable to do even the most "basic" activities such as walking, eating, bathing, dressing, transferring to a bed, or toileting. Fine motor skills are lost. It is difficult to think clearly.   Incapacitating pain 9 Pain becomes incapacitating. Thought processing is no longer possible. Difficult to remember your own name. Control of movement and coordination are lost.   The worst pain imaginable 10 At this level, most patients pass out from pain. When this level is reached, collapse of the autonomic nervous system occurs, leading to a sudden drop in blood pressure and heart rate. This in turn results in a temporary and dramatic drop in blood flow to the brain, leading to a loss of consciousness. Fainting is one of the body's self defense mechanisms. Passing out puts the brain in a calmed state and causes it to shut down for a while, in order to begin the healing process.    Summary: 1. Refer to this scale when providing us with your pain level. 2. Be  accurate and careful when reporting your pain level. This will help with your care. 3. Over-reporting your pain level will lead to loss of credibility. 4. Even a level of 1/10 means that there is pain and will be treated at our facility. 5. High, inaccurate reporting will be documented as "Symptom Exaggeration", leading to loss of credibility and suspicions of possible secondary gains such as obtaining more narcotics, or wanting to appear disabled, for fraudulent reasons. 6. Only pain levels of 5 or below will be seen at our facility. 7. Pain levels of 6 and above will be sent to the Emergency Department and the appointment cancelled. ____________________________________________________________________________________________   ____________________________________________________________________________________________  Preparing for Procedure with Sedation  Instructions: . Oral Intake: Do not eat or drink anything for at least 8 hours prior to your procedure. . Transportation: Public transportation is not allowed. Bring an adult driver. The driver must be physically present in our waiting room before any procedure can be started. . Physical Assistance: Bring an adult physically capable of assisting you, in the event you need help. This adult should keep you company at home for at least 6 hours after the procedure. . Blood Pressure Medicine: Take your blood pressure medicine with a sip of water the morning of the procedure. . Blood thinners: Notify our staff if you are taking any blood thinners. Depending on which one you take, there will be specific instructions on how and when to stop it. . Diabetics on insulin: Notify the staff so that you can be   scheduled 1st case in the morning. If your diabetes requires high dose insulin, take only  of your normal insulin dose the morning of the procedure and notify the staff that you have done so. . Preventing infections: Shower with an antibacterial soap  the morning of your procedure. . Build-up your immune system: Take 1000 mg of Vitamin C with every meal (3 times a day) the day prior to your procedure. Marland Kitchen Antibiotics: Inform the staff if you have a condition or reason that requires you to take antibiotics before dental procedures. . Pregnancy: If you are pregnant, call and cancel the procedure. . Sickness: If you have a cold, fever, or any active infections, call and cancel the procedure. . Arrival: You must be in the facility at least 30 minutes prior to your scheduled procedure. . Children: Do not bring children with you. . Dress appropriately: Bring dark clothing that you would not mind if they get stained. . Valuables: Do not bring any jewelry or valuables.  Procedure appointments are reserved for interventional treatments only. Marland Kitchen No Prescription Refills. . No medication changes will be discussed during procedure appointments. . No disability issues will be discussed.  Reasons to call and reschedule or cancel your procedure: (Following these recommendations will minimize the risk of a serious complication.) . Surgeries: Avoid having procedures within 2 weeks of any surgery. (Avoid for 2 weeks before or after any surgery). . Flu Shots: Avoid having procedures within 2 weeks of a flu shots or . (Avoid for 2 weeks before or after immunizations). . Barium: Avoid having a procedure within 7-10 days after having had a radiological study involving the use of radiological contrast. (Myelograms, Barium swallow or enema study). . Heart attacks: Avoid any elective procedures or surgeries for the initial 6 months after a "Myocardial Infarction" (Heart Attack). . Blood thinners: It is imperative that you stop these medications before procedures. Let us know if you if you take any blood thinner.  . Infection: Avoid procedures during or within two weeks of an infection (including chest colds or gastrointestinal problems). Symptoms associated with  infections include: Localized redness, fever, chills, night sweats or profuse sweating, burning sensation when voiding, cough, congestion, stuffiness, runny nose, sore throat, diarrhea, nausea, vomiting, cold or Flu symptoms, recent or current infections. It is specially important if the infection is over the area that we intend to treat. Marland Kitchen Heart and lung problems: Symptoms that may suggest an active cardiopulmonary problem include: cough, chest pain, breathing difficulties or shortness of breath, dizziness, ankle swelling, uncontrolled high or unusually low blood pressure, and/or palpitations. If you are experiencing any of these symptoms, cancel your procedure and contact your primary care physician for an evaluation.  Remember:  Regular Business hours are:  Monday to Thursday 8:00 AM to 4:00 PM  Provider's Schedule: Milinda Pointer, MD:  Procedure days: Tuesday and Thursday 7:30 AM to 4:00 PM  Gillis Santa, MD:  Procedure days: Monday and Wednesday 7:30 AM to 4:00 PM ____________________________________________________________________________________________   ____________________________________________________________________________________________  Initial Gabapentin Titration  Medication used: Gabapentin (Generic Name) or Neurontin (Brand Name) 300 mg tablets/capsules  Reasons to stop increasing the dose:  Reason 1: You get good relief of symptoms, in which case there is no need to increase the daily dose any further.    Reason 2: You develop some side effects, such as sleeping all of the time, difficulty concentrating, or becoming disoriented, in which case you need to go down on the dose, to the prior level, where  you were not experiencing any side effects. Stay on that dose longer, to allow more time for your body to get use it, before attempting to increase it again.   Steps: Step 1: Start by taking 1 (one) tablet at bedtime x 7 (seven) days.  Step 2: After being on 1  (one) tablet for 7 (seven) days, then increase it to 2 (two) tablets at bedtime for another 7 (seven) days.  Step 3: Next, after being on 2 (two) tablets at bedtime for 7 (seven) days, then increase it to 3 (three) tablets at bedtime, and stay on that dose until you see your doctor.  Reasons to stop increasing the dose: Reason 1: You get good relief of symptoms, in which case there is no need to increase the daily dose any further.  Reason 2: You develop some side effects, such as sleeping all of the time, difficulty concentrating, or becoming disoriented, in which case you need to go down on the dose, to the prior level, where you were not experiencing any side effects. Stay on that dose longer, to allow more time for your body to get use it, before attempting to increase it again.  Endpoint: Once you have reached the maximum dose you can tolerate without side-effects, contact your physician so as to evaluate the results of the regimen.   Questions: Feel free to contact us for any questions or problems at [phone] ____________________________________________________________________________________________  ____________________________________________________________________________________________  Muscle Spasms & Cramps  Cause:  The most common cause of muscle spasms and cramps is vitamin and/or electrolyte (calcium, potassium, sodium, etc.) deficiencies.  Possible triggers: Sweating - causes loss of electrolytes thru the skin. Steroids - causes loss of electrolytes thru the urine.  Treatment: 1. Gatorade (or any other electrolyte-replenishing drink) - Take 1, 8 oz glass with each meal (3 times a day). 2. OTC (over-the-counter) Magnesium 400 to 500 mg - Take 1 tablet twice a day (one with breakfast and one before bedtime). If you have kidney problems, talk to your primary care physician before taking any Magnesium. 3. Tonic Water with quinine - Take 1, 8 oz glass before bedtime.    ____________________________________________________________________________________________

## 2018-06-20 ENCOUNTER — Encounter: Payer: Self-pay | Admitting: Pain Medicine

## 2018-06-20 ENCOUNTER — Other Ambulatory Visit: Payer: Self-pay

## 2018-06-20 ENCOUNTER — Ambulatory Visit: Payer: Medicaid Other | Attending: Pain Medicine | Admitting: Pain Medicine

## 2018-06-20 VITALS — BP 103/66 | HR 84 | Temp 98.2°F | Ht 72.0 in | Wt 240.0 lb

## 2018-06-20 DIAGNOSIS — I10 Essential (primary) hypertension: Secondary | ICD-10-CM | POA: Insufficient documentation

## 2018-06-20 DIAGNOSIS — G8929 Other chronic pain: Secondary | ICD-10-CM

## 2018-06-20 DIAGNOSIS — M5481 Occipital neuralgia: Secondary | ICD-10-CM

## 2018-06-20 DIAGNOSIS — M792 Neuralgia and neuritis, unspecified: Secondary | ICD-10-CM

## 2018-06-20 DIAGNOSIS — K219 Gastro-esophageal reflux disease without esophagitis: Secondary | ICD-10-CM | POA: Diagnosis not present

## 2018-06-20 DIAGNOSIS — D386 Neoplasm of uncertain behavior of respiratory organ, unspecified: Secondary | ICD-10-CM | POA: Diagnosis not present

## 2018-06-20 DIAGNOSIS — M4722 Other spondylosis with radiculopathy, cervical region: Secondary | ICD-10-CM | POA: Diagnosis not present

## 2018-06-20 DIAGNOSIS — Z79899 Other long term (current) drug therapy: Secondary | ICD-10-CM | POA: Diagnosis not present

## 2018-06-20 DIAGNOSIS — M47816 Spondylosis without myelopathy or radiculopathy, lumbar region: Secondary | ICD-10-CM | POA: Diagnosis not present

## 2018-06-20 DIAGNOSIS — G894 Chronic pain syndrome: Secondary | ICD-10-CM | POA: Diagnosis not present

## 2018-06-20 DIAGNOSIS — M545 Low back pain: Secondary | ICD-10-CM | POA: Diagnosis not present

## 2018-06-20 DIAGNOSIS — Z79891 Long term (current) use of opiate analgesic: Secondary | ICD-10-CM | POA: Diagnosis not present

## 2018-06-20 DIAGNOSIS — R569 Unspecified convulsions: Secondary | ICD-10-CM | POA: Insufficient documentation

## 2018-06-20 DIAGNOSIS — J449 Chronic obstructive pulmonary disease, unspecified: Secondary | ICD-10-CM | POA: Diagnosis not present

## 2018-06-20 DIAGNOSIS — R51 Headache: Secondary | ICD-10-CM | POA: Insufficient documentation

## 2018-06-20 DIAGNOSIS — M79604 Pain in right leg: Secondary | ICD-10-CM | POA: Diagnosis not present

## 2018-06-20 DIAGNOSIS — M62838 Other muscle spasm: Secondary | ICD-10-CM

## 2018-06-20 DIAGNOSIS — Z888 Allergy status to other drugs, medicaments and biological substances status: Secondary | ICD-10-CM | POA: Diagnosis not present

## 2018-06-20 DIAGNOSIS — F17210 Nicotine dependence, cigarettes, uncomplicated: Secondary | ICD-10-CM | POA: Insufficient documentation

## 2018-06-20 DIAGNOSIS — G4486 Cervicogenic headache: Secondary | ICD-10-CM

## 2018-06-20 DIAGNOSIS — Z7982 Long term (current) use of aspirin: Secondary | ICD-10-CM | POA: Insufficient documentation

## 2018-06-20 DIAGNOSIS — M47812 Spondylosis without myelopathy or radiculopathy, cervical region: Secondary | ICD-10-CM | POA: Diagnosis not present

## 2018-06-20 DIAGNOSIS — Z8673 Personal history of transient ischemic attack (TIA), and cerebral infarction without residual deficits: Secondary | ICD-10-CM | POA: Diagnosis not present

## 2018-06-20 DIAGNOSIS — M5010 Cervical disc disorder with radiculopathy, unspecified cervical region: Secondary | ICD-10-CM | POA: Diagnosis not present

## 2018-06-20 DIAGNOSIS — M25551 Pain in right hip: Secondary | ICD-10-CM

## 2018-06-20 DIAGNOSIS — J329 Chronic sinusitis, unspecified: Secondary | ICD-10-CM | POA: Insufficient documentation

## 2018-06-20 DIAGNOSIS — M542 Cervicalgia: Secondary | ICD-10-CM | POA: Diagnosis not present

## 2018-06-20 DIAGNOSIS — M79605 Pain in left leg: Secondary | ICD-10-CM

## 2018-06-20 DIAGNOSIS — M7918 Myalgia, other site: Secondary | ICD-10-CM

## 2018-06-20 DIAGNOSIS — M533 Sacrococcygeal disorders, not elsewhere classified: Secondary | ICD-10-CM | POA: Diagnosis not present

## 2018-06-20 DIAGNOSIS — M5030 Other cervical disc degeneration, unspecified cervical region: Secondary | ICD-10-CM

## 2018-06-20 MED ORDER — BACLOFEN 10 MG PO TABS: 10.0000 mg | ORAL_TABLET | Freq: Four times a day (QID) | ORAL | 0 refills | Status: DC | PRN

## 2018-06-20 MED ORDER — GABAPENTIN 300 MG PO CAPS: 300.0000 mg | ORAL_CAPSULE | Freq: Every day | ORAL | 0 refills | Status: DC

## 2018-06-20 MED ORDER — OXYCODONE HCL 5 MG PO TABS: 5.0000 mg | ORAL_TABLET | Freq: Every day | ORAL | 0 refills | Status: DC

## 2018-06-21 ENCOUNTER — Ambulatory Visit: Payer: Medicaid Other

## 2018-06-25 ENCOUNTER — Ambulatory Visit: Payer: Medicaid Other

## 2018-06-28 ENCOUNTER — Ambulatory Visit
Admission: RE | Admit: 2018-06-28 | Discharge: 2018-06-28 | Disposition: A | Discharge status: Home / Self Care | Payer: Medicaid Other | Source: Ambulatory Visit | Attending: Neurology | Admitting: Neurology

## 2018-06-28 ENCOUNTER — Ambulatory Visit: Payer: Medicaid Other | Attending: Pain Medicine

## 2018-06-28 ENCOUNTER — Ambulatory Visit: Admission: RE | Admit: 2018-06-28 | Payer: Medicaid Other | Source: Ambulatory Visit

## 2018-06-28 VITALS — BP 106/60 | HR 74

## 2018-06-28 DIAGNOSIS — G4452 New daily persistent headache (NDPH): Secondary | ICD-10-CM

## 2018-06-28 DIAGNOSIS — M542 Cervicalgia: Secondary | ICD-10-CM | POA: Diagnosis present

## 2018-06-28 DIAGNOSIS — M62838 Other muscle spasm: Secondary | ICD-10-CM | POA: Diagnosis present

## 2018-06-28 NOTE — Therapy (Signed)
[facility] [address] [place], [address] Phone: [phone]   Fax:  [phone]  Physical Therapy Treatment  Patient Details  Name: Mark Vang MRN: [id number] Date of Birth: 1961/06/08 Referring Provider (PT): Milinda Pointer   Encounter Date: 06/28/2018  PT End of Session - 06/28/18 1433    Visit Number  5    Number of Visits  12    Date for PT Re-Evaluation  07/03/18    Authorization Type  medicaid, authorized 8    PT Start Time  1433    PT Stop Time  1515    PT Time Calculation (min)  42 min    Activity Tolerance  Patient tolerated treatment well    Behavior During Therapy  [facility] for tasks assessed/performed       Past Medical History:  Diagnosis Date  . COPD (chronic obstructive pulmonary disease) ([place])   . Hypertension   . Stroke [facility])     Past Surgical History:  Procedure Laterality Date  . KNEE SURGERY    . NASAL SINUS SURGERY      Vitals:   06/28/18 1439  BP: 106/60  Pulse: 74  SpO2: 99%    Subjective Assessment - 06/28/18 1440    Subjective  Patient reports that he has an injections since last PT visit. States he uses ice when his pain increases.    Currently in Pain?  Yes    Pain Score  6    HA: 9   Pain Location  Neck    Pain Orientation  Mid    Pain Descriptors / Indicators  Aching;Tightness    Pain Type  Chronic pain        Ther-ex Chin tucks in seated position with verbal/demo x10 Standing rows with RTB 2x10 verbal/tactile cues Standing rows advanced with RTB 2x10 verbal/tactile cues Cervical rotation x10 b/l (decreased motion to R)  Manual therapy: x75mins STM to b/l UT, cervical paraspinals, suboccipital release to address tissue tension/muscle length  Modalities: High volt100ps, 2sec (~175volts ea)with 4 pads to b/l posterior cervical paraspinals/UT to decrease tension/pain management with moist hot pack to encourage relaxation, improved tissue  extensibility  Pt educated throughout session about proper posture and technique with exercises. Reports feeling looser/decreased pain at end of session.   PT Education - 06/28/18 1441    Education Details  therex technique/form    Person(s) Educated  Patient    Methods  Explanation;Tactile cues;Demonstration    Comprehension  Verbalized understanding       PT Short Term Goals - 06/13/18 1347      PT SHORT TERM GOAL #1   Title  Patient will be adherent to HEP at least 3x a week to improve functional strength and ROM  for better ability to perform functional tasks at home.     Baseline  06/13/18: performs daily    Time  3    Period  Weeks    Status  Achieved        PT Long Term Goals - 06/13/18 1347      PT LONG TERM GOAL #1   Title  Patient will increase BUE gross strength to [facility] as to improve functional strength for functional tasks, ADLs and recreational activities.    Baseline  06/13/18 RUE: weak R shoulder flex/abd, elbow ext, grip strength secondary CVA 2012/2015    Time  6    Period  Weeks    Status  On-going  Target Date  07/03/18      PT LONG TERM GOAL #2   Title  Patient will be able to perform household work/ chores without increase in symptoms.    Baseline  06/13/18: Still increases pain    Time  6    Period  Weeks    Status  On-going    Target Date  07/03/18      PT LONG TERM GOAL #3   Title  Patient will increase ability to perform functional tasks independently with decreased pain and improved functional ability demonstrated with improvement in FOTO score to at least 70 or greater.     Baseline  FOTO:46; 06/13/18: FOTO: 36    Time  6    Period  Weeks    Status  On-going    Target Date  07/03/18      PT LONG TERM GOAL #4   Title  Pt will demonstrate decrease in NDI by at least 19% in order to demonstrate clinically significant reduction in disability related to neck injury/pain     Baseline  06/13/18: 80%    Time  6    Period  Weeks    Status  New     Target Date  07/25/18            Plan - 06/28/18 1457    Clinical Impression Statement  Patient had good tolerance to exercises today, no complaints of pain during exercises. Patient needed verbal/tactile cueing throughout to attend to posture/posture corrections.     PT Frequency  2x / week    PT Duration  6 weeks    PT Treatment/Interventions  Neuromuscular re-education;Manual techniques;Moist Heat;Patient/family education;Therapeutic activities;Traction;Ultrasound;Therapeutic exercise;Passive range of motion;Electrical Stimulation;Dry needling    PT Next Visit Plan  continue STM, IASTM, ROM, postural strengthening, stretches     PT Home Exercise Plan  cervical rom in painfree range, cervical flexion isometric: [id number]     Consulted and Agree with Plan of Care  Patient       Patient will benefit from skilled therapeutic intervention in order to improve the following deficits and impairments:  Decreased range of motion, Increased fascial restricitons, Impaired tone, Decreased endurance, Increased muscle spasms, Impaired UE functional use, Decreased activity tolerance, Pain, Postural dysfunction, Decreased strength, Decreased mobility  Visit Diagnosis: Cervicalgia  Other muscle spasm     Problem List Patient Active Problem List   Diagnosis Date Noted  . Headache disorder 06/13/2018  . Cervicalgia 06/07/2018  . Cervical facet hypertrophy (Bilateral) 06/07/2018  . Abnormal MRI, cervical spine (05/17/2018) 05/31/2018  . Neurogenic pain 05/30/2018  . Cervical facet syndrome (Bilateral) (L>R) 05/30/2018  . Spondylosis without myelopathy or radiculopathy, cervical region 05/30/2018  . Chronic musculoskeletal pain 05/10/2018  . Trigger point of neck 05/10/2018  . Osteoarthritis of spine with radiculopathy, cervical region 05/09/2018  . Chronic nonintractable headache 05/03/2018  . Chronic hip pain [facility] Pain) (Right) 04/30/2018  . Chronic neck pain (Primary Area  of Pain) (Bilateral) (L>R) 04/30/2018  . DDD (degenerative disc disease), cervical 04/30/2018  . Muscle spasms of neck 04/30/2018  . Opiate use 04/30/2018  . Cervicogenic headache 04/30/2018  . Occipital headache (Bilateral) 04/30/2018  . Cervico-occipital neuralgia (Bilateral) 04/30/2018  . Lumbar facet syndrome (Bilateral) 04/30/2018  . Lumbar facet arthropathy (Bilateral) 04/30/2018  . DDD (degenerative disc disease), lumbosacral 04/30/2018  . Chronic sacroiliac joint pain (Left) 04/30/2018  . Current tear knee, medial meniscus 04/27/2018  . Osteoarthritis of knee 04/27/2018  . Cervical radiculopathy 04/12/2018  .  Chronic lower extremity pain [facility] Pain) (Bilateral) (R>L) 04/02/2018  . Chronic low back pain (Secondary Area of Pain) (Bilateral) (R>L) 04/02/2018  . Chronic pain syndrome 04/02/2018  . Long term current use of opiate analgesic 04/02/2018  . Pharmacologic therapy 04/02/2018  . Disorder of skeletal system 04/02/2018  . Problems influencing health status 04/02/2018  . Seizure ([place]) 07/01/2016  . Sinusitis 07/01/2016  . Stroke ([place]) 07/01/2016  . COPD (chronic obstructive pulmonary disease) ([place]) 01/11/2013  . Dyspnea on exertion 01/11/2013  . GERD (gastroesophageal reflux disease) 01/11/2013  . HTN (hypertension) 01/11/2013  . Seizures ([place]) 01/11/2013  . Tobacco abuse 01/11/2013  . Convulsions ([place]) 01/11/2013  . Chronic sinusitis 12/13/2012  . 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, [place], [address] Phone: [phone]   Fax:  [phone]  Name: Clayborn Milnes MRN: [id number] Date of Birth: [birth year]

## 2018-07-02 ENCOUNTER — Ambulatory Visit: Payer: Medicaid Other

## 2018-07-03 ENCOUNTER — Ambulatory Visit: Payer: Medicaid Other | Admitting: Pain Medicine

## 2018-07-05 ENCOUNTER — Ambulatory Visit: Payer: Medicaid Other

## 2018-07-05 ENCOUNTER — Telehealth: Payer: Self-pay

## 2018-07-05 NOTE — Telephone Encounter (Signed)
PT called and spoke with patient about missed appointment this AM. Patient stated that "he forgot" about his appointment. Patient also informed PT that he was not feeling well recently and had been given antibiotics for "dizziness" while sitting up and  "ear redness". Patient transferred at end of phone call to front desk to adjust physical therapy appointment times.   Lieutenant Diego PT, DPT 2:54 PM,07/05/18 [phone]

## 2018-07-09 ENCOUNTER — Ambulatory Visit: Payer: Medicaid Other | Admitting: Physical Therapy

## 2018-07-09 ENCOUNTER — Encounter: Payer: Self-pay | Admitting: Physical Therapy

## 2018-07-09 VITALS — BP 148/68

## 2018-07-09 DIAGNOSIS — M62838 Other muscle spasm: Secondary | ICD-10-CM

## 2018-07-09 DIAGNOSIS — M542 Cervicalgia: Secondary | ICD-10-CM | POA: Diagnosis not present
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
## 2018-07-10 ENCOUNTER — Ambulatory Visit: Payer: Medicaid Other

## 2018-07-11 ENCOUNTER — Ambulatory Visit: Payer: Medicaid Other | Admitting: Physical Therapy

## 2018-07-12 ENCOUNTER — Ambulatory Visit: Payer: Medicaid Other

## 2018-07-17 ENCOUNTER — Ambulatory Visit
Admission: RE | Admit: 2018-07-17 | Discharge: 2018-07-17 | Disposition: A | Discharge status: Home / Self Care | Payer: Medicaid Other | Source: Ambulatory Visit | Attending: Pain Medicine | Admitting: Pain Medicine

## 2018-07-17 ENCOUNTER — Ambulatory Visit: Payer: Medicaid Other

## 2018-07-17 ENCOUNTER — Encounter: Payer: Self-pay | Admitting: Pain Medicine

## 2018-07-17 ENCOUNTER — Ambulatory Visit (HOSPITAL_BASED_OUTPATIENT_CLINIC_OR_DEPARTMENT_OTHER): Payer: Medicaid Other | Admitting: Pain Medicine

## 2018-07-17 ENCOUNTER — Other Ambulatory Visit: Payer: Self-pay

## 2018-07-17 VITALS — BP 123/70 | HR 79 | Temp 96.6°F | Resp 17 | Ht 72.0 in | Wt 240.0 lb

## 2018-07-17 DIAGNOSIS — Z888 Allergy status to other drugs, medicaments and biological substances status: Secondary | ICD-10-CM | POA: Insufficient documentation

## 2018-07-17 DIAGNOSIS — M47812 Spondylosis without myelopathy or radiculopathy, cervical region: Secondary | ICD-10-CM | POA: Insufficient documentation

## 2018-07-17 DIAGNOSIS — Z79899 Other long term (current) drug therapy: Secondary | ICD-10-CM | POA: Diagnosis not present

## 2018-07-17 DIAGNOSIS — I9581 Postprocedural hypotension: Secondary | ICD-10-CM

## 2018-07-17 DIAGNOSIS — Z79891 Long term (current) use of opiate analgesic: Secondary | ICD-10-CM | POA: Insufficient documentation

## 2018-07-17 DIAGNOSIS — M542 Cervicalgia: Secondary | ICD-10-CM | POA: Diagnosis present

## 2018-07-17 DIAGNOSIS — G4486 Cervicogenic headache: Secondary | ICD-10-CM

## 2018-07-17 DIAGNOSIS — R51 Headache: Secondary | ICD-10-CM

## 2018-07-17 DIAGNOSIS — M5481 Occipital neuralgia: Secondary | ICD-10-CM

## 2018-07-17 DIAGNOSIS — G8929 Other chronic pain: Secondary | ICD-10-CM

## 2018-07-17 DIAGNOSIS — R937 Abnormal findings on diagnostic imaging of other parts of musculoskeletal system: Secondary | ICD-10-CM

## 2018-07-17 DIAGNOSIS — I959 Hypotension, unspecified: Secondary | ICD-10-CM | POA: Insufficient documentation

## 2018-07-17 LAB — GLUCOSE, CAPILLARY: GLUCOSE-CAPILLARY: 105 mg/dL — AB (ref 70–99)

## 2018-07-17 MED ORDER — ROPIVACAINE HCL 2 MG/ML IJ SOLN
18.0000 mL | Freq: Once | INTRAMUSCULAR | Status: AC
  Administered 2018-07-17: 10 mL via PERINEURAL
  Filled 2018-07-17: qty 20

## 2018-07-17 MED ORDER — MIDAZOLAM HCL 5 MG/5ML IJ SOLN
1.0000 mg | INTRAMUSCULAR | Status: DC | PRN
  Administered 2018-07-17: 3 mg via INTRAVENOUS
  Filled 2018-07-17: qty 5

## 2018-07-17 MED ORDER — EPHEDRINE SULFATE 50 MG/ML IJ SOLN
INTRAMUSCULAR | Status: AC
  Filled 2018-07-17: qty 1

## 2018-07-17 MED ORDER — LACTATED RINGERS IV SOLN
1000.0000 mL | Freq: Once | INTRAVENOUS | Status: AC
  Administered 2018-07-17: 1000 mL via INTRAVENOUS

## 2018-07-17 MED ORDER — FENTANYL CITRATE (PF) 100 MCG/2ML IJ SOLN
25.0000 ug | INTRAMUSCULAR | Status: DC | PRN
  Administered 2018-07-17: 100 ug via INTRAVENOUS
  Filled 2018-07-17: qty 2

## 2018-07-17 MED ORDER — LIDOCAINE HCL 2 % IJ SOLN
20.0000 mL | Freq: Once | INTRAMUSCULAR | Status: AC
  Administered 2018-07-17: 400 mg
  Filled 2018-07-17: qty 40

## 2018-07-17 MED ORDER — DEXAMETHASONE SODIUM PHOSPHATE 10 MG/ML IJ SOLN
20.0000 mg | Freq: Once | INTRAMUSCULAR | Status: AC
  Administered 2018-07-17: 10 mg
  Filled 2018-07-17: qty 2

## 2018-07-17 MED ORDER — SODIUM CHLORIDE 0.9 % IJ SOLN
INTRAMUSCULAR | Status: AC
  Filled 2018-07-17: qty 10

## 2018-07-17 MED ORDER — EPHEDRINE 5 MG/ML INJ
10.0000 mg | Freq: Once | INTRAVENOUS | Status: AC
  Administered 2018-07-17: 10 mg via INTRAVENOUS

## 2018-07-17 NOTE — Progress Notes (Signed)
Patient's Name: Mark Vang  MRN: [id number]  Referring Provider: Remi Haggard, FNP  DOB: 1961-05-14  PCP: Remi Haggard, FNP  DOS: 07/17/2018  Note by: Gaspar Cola, MD  Service setting: Ambulatory outpatient  Specialty: Interventional Pain Management  Patient type: Established  Location: [facility] (AMB) Pain Management Facility  Visit type: Interventional Procedure   Primary Reason for Visit: Interventional Pain Management Treatment. CC: Neck Pain (bilateral)  Procedure:          Anesthesia, Analgesia, Anxiolysis:  Type: Cervical Facet Medial Branch Block(s)  #2  Primary Purpose: Diagnostic Region: Posterolateral cervical spine Level: C3, C4, C5, C6, & C7 Medial Branch Level(s). Injecting these levels blocks the C3-4, C4-5, C5-6, and C6-7 cervical facet joints. Laterality: Bilateral  Type: Moderate (Conscious) Sedation combined with Local Anesthesia Indication(s): Analgesia and Anxiety Route: Intravenous (IV) IV Access: Secured Sedation: Meaningful verbal contact was maintained at all times during the procedure  Local Anesthetic: Lidocaine 1-2%  Position: Prone with head of the table raised to facilitate breathing.   Indications: 1. Spondylosis without myelopathy or radiculopathy, cervical region   2. Cervical facet syndrome (Bilateral) (L>R)   3. Cervical facet hypertrophy (Bilateral)   4. Cervicalgia    Pain Score: Pre-procedure: 8 /10 Post-procedure: 0-No pain/10  Pre-op Assessment:  Mark Vang is a 57 y.o. (year old), male patient, seen today for interventional treatment. He  has a past surgical history that includes Knee surgery and Nasal sinus surgery. Mark Vang has a current medication list which includes the following prescription(s): amlodipine, aspirin, atorvastatin, baclofen, cholecalciferol, dexlansoprazole, fluticasone, fluticasone-umeclidin-vilant, gabapentin, lisinopril, multivitamin, nortriptyline, oxcarbazepine, oxycodone, oxygen-helium,  pantoprazole, and potassium citrate, and the following Facility-Administered Medications: fentanyl and midazolam. His primarily concern today is the Neck Pain (bilateral)  Initial Vital Signs:  Pulse/HCG Rate: 76ECG Heart Rate: 79 Temp: 98.4 F (36.9 C) Resp: 16 BP: 107/82 SpO2: 97 %  BMI: Estimated body mass index is 32.55 kg/m as calculated from the following:   Height as of this encounter: 6' (1.829 m).   Weight as of this encounter: 240 lb (108.9 kg).  Risk Assessment: Allergies: Reviewed. He is allergic to amlodipine besy-benazepril hcl; amlodipine; and hydrochlorothiazide.  Allergy Precautions: None required Coagulopathies: Reviewed. None identified.  Blood-thinner therapy: None at this time Active Infection(s): Reviewed. None identified. Mark Vang is afebrile  Site Confirmation: Mark Vang was asked to confirm the procedure and laterality before marking the site Procedure checklist: Completed Consent: Before the procedure and under the influence of no sedative(s), amnesic(s), or anxiolytics, the patient was informed of the treatment options, risks and possible complications. To fulfill our ethical and legal obligations, as recommended by the American Medical Association's Code of Ethics, I have informed the patient of my clinical impression; the nature and purpose of the treatment or procedure; the risks, benefits, and possible complications of the intervention; the alternatives, including doing nothing; the risk(s) and benefit(s) of the alternative treatment(s) or procedure(s); and the risk(s) and benefit(s) of doing nothing. The patient was provided information about the general risks and possible complications associated with the procedure. These may include, but are not limited to: failure to achieve desired goals, infection, bleeding, organ or nerve damage, allergic reactions, paralysis, and death. In addition, the patient was informed of those risks and complications  associated to Spine-related procedures, such as failure to decrease pain; infection (i.e.: Meningitis, epidural or intraspinal abscess); bleeding (i.e.: epidural hematoma, subarachnoid hemorrhage, or any other type of intraspinal or peri-dural bleeding); organ or nerve damage (  i.e.: Any type of peripheral nerve, nerve root, or spinal cord injury) with subsequent damage to sensory, motor, and/or autonomic systems, resulting in permanent pain, numbness, and/or weakness of one or several areas of the body; allergic reactions; (i.e.: anaphylactic reaction); and/or death. Furthermore, the patient was informed of those risks and complications associated with the medications. These include, but are not limited to: allergic reactions (i.e.: anaphylactic or anaphylactoid reaction(s)); adrenal axis suppression; blood sugar elevation that in diabetics may result in ketoacidosis or comma; water retention that in patients with history of congestive heart failure may result in shortness of breath, pulmonary edema, and decompensation with resultant heart failure; weight gain; swelling or edema; medication-induced neural toxicity; particulate matter embolism and blood vessel occlusion with resultant organ, and/or nervous system infarction; and/or aseptic necrosis of one or more joints. Finally, the patient was informed that Medicine is not an exact science; therefore, there is also the possibility of unforeseen or unpredictable risks and/or possible complications that may result in a catastrophic outcome. The patient indicated having understood very clearly. We have given the patient no guarantees and we have made no promises. Enough time was given to the patient to ask questions, all of which were answered to the patient's satisfaction. Mark Vang has indicated that he wanted to continue with the procedure. Attestation: I, the ordering provider, attest that I have discussed with the patient the benefits, risks, side-effects,  alternatives, likelihood of achieving goals, and potential problems during recovery for the procedure that I have provided informed consent. Date  Time: 07/17/2018  1:41 PM  Pre-Procedure Preparation:  Monitoring: As per clinic protocol. Respiration, ETCO2, SpO2, BP, heart rate and rhythm monitor placed and checked for adequate function Safety Precautions: Patient was assessed for positional comfort and pressure points before starting the procedure. Time-out: I initiated and conducted the "Time-out" before starting the procedure, as per protocol. The patient was asked to participate by confirming the accuracy of the "Time Out" information. Verification of the correct person, site, and procedure were performed and confirmed by me, the nursing staff, and the patient. "Time-out" conducted as per Joint Commission's Universal Protocol (UP.01.01.01). Time: 1427  Description of Procedure:          Laterality: Bilateral. The procedure was performed in identical fashion on both sides. Level: C3, C4, C5, C6, & C7 Medial Branch Level(s). Area Prepped: Posterior Cervico-thoracic Region Prepping solution: ChloraPrep (2% chlorhexidine gluconate and 70% isopropyl alcohol) Safety Precautions: Aspiration looking for blood return was conducted prior to all injections. At no point did we inject any substances, as a needle was being advanced. Before injecting, the patient was told to immediately notify me if he was experiencing any new onset of "ringing in the ears, or metallic taste in the mouth". No attempts were made at seeking any paresthesias. Safe injection practices and needle disposal techniques used. Medications properly checked for expiration dates. SDV (single dose vial) medications used. After the completion of the procedure, all disposable equipment used was discarded in the proper designated medical waste containers. Local Anesthesia: Protocol guidelines were followed. The patient was positioned over the  fluoroscopy table. The area was prepped in the usual manner. The time-out was completed. The target area was identified using fluoroscopy. A 12-in long, straight, sterile hemostat was used with fluoroscopic guidance to locate the targets for each level blocked. Once located, the skin was marked with an approved surgical skin marker. Once all sites were marked, the skin (epidermis, dermis, and hypodermis), as well as deeper  tissues (fat, connective tissue and muscle) were infiltrated with a small amount of a short-acting local anesthetic, loaded on a 10cc syringe with a 25G, 1.5-in  Needle. An appropriate amount of time was allowed for local anesthetics to take effect before proceeding to the next step. Local Anesthetic: Lidocaine 2.0% The unused portion of the local anesthetic was discarded in the proper designated containers. Technical explanation of process:  C3 Medial Branch Nerve Block (MBB): The target area for the C3 dorsal medial articular branch is the lateral concave waist of the articular pillar of C3. Under fluoroscopic guidance, a Quincke needle was inserted until contact was made with os over the postero-lateral aspect of the articular pillar of C3 (target area). After negative aspiration for blood, 0.5 mL of the nerve block solution was injected without difficulty or complication. The needle was removed intact. C4 Medial Branch Nerve Block (MBB): The target area for the C4 dorsal medial articular branch is the lateral concave waist of the articular pillar of C4. Under fluoroscopic guidance, a Quincke needle was inserted until contact was made with os over the postero-lateral aspect of the articular pillar of C4 (target area). After negative aspiration for blood, 0.5 mL of the nerve block solution was injected without difficulty or complication. The needle was removed intact. C5 Medial Branch Nerve Block (MBB): The target area for the C5 dorsal medial articular branch is the lateral concave waist  of the articular pillar of C5. Under fluoroscopic guidance, a Quincke needle was inserted until contact was made with os over the postero-lateral aspect of the articular pillar of C5 (target area). After negative aspiration for blood, 0.5 mL of the nerve block solution was injected without difficulty or complication. The needle was removed intact. C6 Medial Branch Nerve Block (MBB): The target area for the C6 dorsal medial articular branch is the lateral concave waist of the articular pillar of C6. Under fluoroscopic guidance, a Quincke needle was inserted until contact was made with os over the postero-lateral aspect of the articular pillar of C6 (target area). After negative aspiration for blood, 0.5 mL of the nerve block solution was injected without difficulty or complication. The needle was removed intact. C7 Medial Branch Nerve Block (MBB): The target for the C7 dorsal medial articular branch lies on the superior-medial tip of the C7 transverse process. Under fluoroscopic guidance, a Quincke needle was inserted until contact was made with os over the postero-lateral aspect of the articular pillar of C7 (target area). After negative aspiration for blood, 0.5 mL of the nerve block solution was injected without difficulty or complication. The needle was removed intact. Procedural Needles: 22-gauge, 3.5-inch, Quincke needles used for all levels. Nerve block solution: 0.2% PF-Ropivacaine + Triamcinolone (40 mg/mL) diluted to a final concentration of 4 mg of Triamcinolone/mL of Ropivacaine The unused portion of the solution was discarded in the proper designated containers.  Once the entire procedure was completed, the treated area was cleaned, making sure to leave some of the prepping solution back to take advantage of its long term bactericidal properties.  Vitals:   07/17/18 1506 07/17/18 1510 07/17/18 1516 07/17/18 1526  BP: (!) 85/44 115/68 123/72 123/70  Pulse:      Resp: 15 15 18 17   Temp:        TempSrc:      SpO2: 99% 100% 100% 100%  Weight:      Height:        Start Time: 1427 hrs. End Time: 1439 hrs.  Imaging  Guidance (Spinal):          Type of Imaging Technique: Fluoroscopy Guidance (Spinal) Indication(s): Assistance in needle guidance and placement for procedures requiring needle placement in or near specific anatomical locations not easily accessible without such assistance. Exposure Time: Please see nurses notes. Contrast: None used. Fluoroscopic Guidance: I was personally present during the use of fluoroscopy. "Tunnel Vision Technique" used to obtain the best possible view of the target area. Parallax error corrected before commencing the procedure. "Direction-depth-direction" technique used to introduce the needle under continuous pulsed fluoroscopy. Once target was reached, antero-posterior, oblique, and lateral fluoroscopic projection used confirm needle placement in all planes. Images permanently stored in EMR. Interpretation: No contrast injected. I personally interpreted the imaging intraoperatively. Adequate needle placement confirmed in multiple planes. Permanent images saved into the patient's record.  Antibiotic Prophylaxis:   Anti-infectives (From admission, onward)   None     Indication(s): None identified  Post-operative Assessment:  Post-procedure Vital Signs:  Pulse/HCG Rate: 7965 Temp: (!) 96.6 F (35.9 C) Resp: 17 BP: 123/70 SpO2: 100 %  EBL: None  Complications: No immediate post-treatment complications observed by team, or reported by patient.  Note: The patient tolerated the entire procedure well. A repeat set of vitals were taken after the procedure and the patient was kept under observation following institutional policy, for this type of procedure. Post-procedural neurological assessment was performed, showing return to baseline, prior to discharge. The patient was provided with post-procedure discharge instructions, including a section  on how to identify potential problems. Should any problems arise concerning this procedure, the patient was given instructions to immediately contact us, at any time, without hesitation. In any case, we plan to contact the patient by telephone for a follow-up status report regarding this interventional procedure.  Comments:  No additional relevant information.  Plan of Care    Imaging Orders     DG C-Arm 1-60 Min-No Report  Procedure Orders     CERVICAL FACET (MEDIAL BRANCH NERVE BLOCK)   Medications ordered for procedure: Meds ordered this encounter  Medications  . lidocaine (XYLOCAINE) 2 % (with pres) injection 400 mg  . midazolam (VERSED) 5 MG/5ML injection 1-2 mg    Make sure Flumazenil is available in the pyxis when using this medication. If oversedation occurs, administer 0.2 mg IV over 15 sec. If after 45 sec no response, administer 0.2 mg again over 1 min; may repeat at 1 min intervals; not to exceed 4 doses (1 mg)  . fentaNYL (SUBLIMAZE) injection 25-50 mcg    Make sure Narcan is available in the pyxis when using this medication. In the event of respiratory depression (RR< 8/min): Titrate NARCAN (naloxone) in increments of 0.1 to 0.2 mg IV at 2-3 minute intervals, until desired degree of reversal.  . lactated ringers infusion 1,000 mL  . ropivacaine (PF) 2 mg/mL (0.2%) (NAROPIN) injection 18 mL  . dexamethasone (DECADRON) injection 20 mg  . ePHEDrine injection 10 mg    Indications: Bradycardia   Medications administered: We administered lidocaine, midazolam, fentaNYL, lactated ringers, ropivacaine (PF) 2 mg/mL (0.2%), dexamethasone, and ePHEDrine.  See the medical record for exact dosing, route, and time of administration.  Disposition: Discharge home  Discharge Date & Time: 07/17/2018; 1532 hrs.   Physician-requested Follow-up: Return for post-procedure eval (2 wks), w/ Dr. Dossie Arbour.  Future Appointments  Date Time Provider [place]  08/01/2018  1:45 PM  Milinda Pointer, MD [facility] None  04/15/2019  1:45 PM McGowan, Larene Beach A, PA-C [facility] None  Primary Care Physician: Remi Haggard, FNP Location: [facility] Note by: Gaspar Cola, MD Date: 07/17/2018; Time: 3:55 PM  Disclaimer:  Medicine is not an Chief Strategy Officer. The only guarantee in medicine is that nothing is guaranteed. It is important to note that the decision to proceed with this intervention was based on the information collected from the patient. The Data and conclusions were drawn from the patient's questionnaire, the interview, and the physical examination. Because the information was provided in large part by the patient, it cannot be guaranteed that it has not been purposely or unconsciously manipulated. Every effort has been made to obtain as much relevant data as possible for this evaluation. It is important to note that the conclusions that lead to this procedure are derived in large part from the available data. Always take into account that the treatment will also be dependent on availability of resources and existing treatment guidelines, considered by other Pain Management Practitioners as being common knowledge and practice, at the time of the intervention. For Medico-Legal purposes, it is also important to point out that variation in procedural techniques and pharmacological choices are the acceptable norm. The indications, contraindications, technique, and results of the above procedure should only be interpreted and judged by a Board-Certified Interventional Pain Specialist with extensive familiarity and expertise in the same exact procedure and technique.

## 2018-07-17 NOTE — Patient Instructions (Signed)

## 2018-07-17 NOTE — Progress Notes (Signed)
Safety precautions to be maintained throughout the outpatient stay will include: orient to surroundings, keep bed in low position, maintain call bell within reach at all times, provide assistance with transfer out of bed and ambulation.  

## 2018-07-17 NOTE — Progress Notes (Signed)
1440 When turning pt over to bed patient flushed, and diaphoric. IVF increased (squeezing bag)  while moving patient over to recovery, notified MD, 02 2l started immediately.Patient positioned in [place] in with patient. Report given to MD. Verbal orders to given Ephedrine 10mg  given IV,  Pt more alert and responding the whole time. HR monitored BP monitored and BS 105.

## 2018-07-18 ENCOUNTER — Telehealth: Payer: Self-pay

## 2018-07-31 NOTE — Progress Notes (Signed)
Patient's Name: Mark Vang  MRN: [id number]  Referring Provider: Remi Haggard, FNP  DOB: [birth year]  PCP: Remi Haggard, FNP  DOS: 08/01/2018  Note by: Gaspar Cola, MD  Service setting: Ambulatory outpatient  Specialty: Interventional Pain Management  Location: [facility] (AMB) Pain Management Facility    Patient type: Established   Primary Reason(s) for Visit: Encounter for post-procedure evaluation of chronic illness with mild to moderate exacerbation CC: Follow-up and Medication Refill (oxycodone, baclofen and gabapentin )  HPI  Mr. Baack is a 57 y.o. year old, male patient, who comes today for a post-procedure evaluation. 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; Seizure ([place]); Seizures ([place]); Sinusitis; Stroke ([place]); Tobacco abuse; Chronic lower extremity pain (Tertiary Area of Pain) (Bilateral) (R>L); Chronic low back pain (Secondary Area of Pain) (Bilateral) (R>L); Chronic pain syndrome; Long term current use of opiate analgesic; Pharmacologic therapy; Disorder of skeletal system; Problems influencing health status; Current tear knee, medial meniscus; Cervical radiculopathy; Osteoarthritis of knee; Convulsions ([place]); Chronic hip pain [facility] Area of Pain) (Right); Chronic neck pain (Primary Area of Pain) (Bilateral) (L>R); DDD (degenerative disc disease), cervical; Muscle spasms of neck; Opiate use; Cervicogenic headache; Occipital headache (Bilateral); Cervico-occipital neuralgia (Bilateral); Lumbar facet syndrome (Bilateral); Lumbar facet arthropathy (Bilateral); DDD (degenerative disc disease), lumbosacral; Chronic sacroiliac joint pain (Left); Osteoarthritis of spine with radiculopathy, cervical region; Chronic musculoskeletal pain; Trigger point of neck; Chronic nonintractable headache; Neurogenic pain;  Cervical facet syndrome (Bilateral) (L>R); Spondylosis without myelopathy or radiculopathy, cervical region; Abnormal MRI, cervical spine (05/17/2018); Cervicalgia; Cervical facet hypertrophy (Bilateral); Headache disorder; Hypotension; and Right shoulder pain on their problem list. His primarily concern today is the Follow-up and Medication Refill (oxycodone, baclofen and gabapentin )  Pain Assessment: Location: Posterior Neck Radiating: Denies  Onset: More than a month ago Duration: Chronic pain Quality: Discomfort, Dull("It just there pain") Severity: 5 /10 (subjective, self-reported pain score)  Note: Reported level is compatible with observation.                         When using our objective Pain Scale, levels between 6 and 10/10 are said to belong in an emergency room, as it progressively worsens from a 6/10, described as severely limiting, requiring emergency care not usually available at an outpatient pain management facility. At a 6/10 level, communication becomes difficult and requires great effort. Assistance to reach the emergency department may be required. Facial flushing and profuse sweating along with potentially dangerous increases in heart rate and blood pressure will be evident. Effect on ADL: "Very limiting and aggrevating"  Timing: Constant Modifying factors: Procedure, medications  BP: 110/75  HR: 88  Mr. Mchatton comes in today for post-procedure evaluation.  Further details on both, my assessment(s), as well as the proposed treatment plan, please see below.  Post-Procedure Assessment  07/17/2018 Procedure: Diagnostic bilateral cervical facet block #2 under fluoroscopic guidance and IV sedation Pre-procedure pain score:  8/10 Post-procedure pain score: 0/10 (100% relief) Influential Factors: BMI: 32.55 kg/m Intra-procedural challenges: None observed.         Assessment challenges: None detected.              Reported side-effects: None.        Post-procedural  adverse reactions or complications: None reported         Sedation: Sedation provided. When no sedatives are used,  the analgesic levels obtained are directly associated to the effectiveness of the local anesthetics. However, when sedation is provided, the level of analgesia obtained during the initial 1 hour following the intervention, is believed to be the result of a combination of factors. These factors may include, but are not limited to: 1. The effectiveness of the local anesthetics used. 2. The effects of the analgesic(s) and/or anxiolytic(s) used. 3. The degree of discomfort experienced by the patient at the time of the procedure. 4. The patients ability and reliability in recalling and recording the events. 5. The presence and influence of possible secondary gains and/or psychosocial factors. Reported result: Relief experienced during the 1st hour after the procedure: 100 % (Ultra-Short Term Relief)            Interpretative annotation: Clinically appropriate result. Analgesia during this period is likely to be Local Anesthetic and/or IV Sedative (Analgesic/Anxiolytic) related.          Effects of local anesthetic: The analgesic effects attained during this period are directly associated to the localized infiltration of local anesthetics and therefore cary significant diagnostic value as to the etiological location, or anatomical origin, of the pain. Expected duration of relief is directly dependent on the pharmacodynamics of the local anesthetic used. Long-acting (4-6 hours) anesthetics used.  Reported result: Relief during the next 4 to 6 hour after the procedure: 100 % (Short-Term Relief)            Interpretative annotation: Clinically appropriate result. Analgesia during this period is likely to be Local Anesthetic-related.          Long-term benefit: Defined as the period of time past the expected duration of local anesthetics (1 hour for short-acting and 4-6 hours for long-acting). With  the possible exception of prolonged sympathetic blockade from the local anesthetics, benefits during this period are typically attributed to, or associated with, other factors such as analgesic sensory neuropraxia, antiinflammatory effects, or beneficial biochemical changes provided by agents other than the local anesthetics.  Reported result: Extended relief following procedure: 50 %(Pain returned but not has much as before. [place]ache are now intermit. instead of constant headache before procedure) (Long-Term Relief)            Interpretative annotation: Clinically possible results. Good relief. No permanent benefit expected. Inflammation plays a part in the etiology to the pain.          Current benefits: Defined as reported results that persistent at this point in time.   Analgesia: 25-50 % Mr. Andujo reports improvement of axial symptoms. Function: Somewhat improved ROM: Somewhat improved Interpretative annotation: Ongoing benefit. Therapeutic benefit observed. Results would suggest persistent aggravating factors.          Interpretation: Results would suggest a successful diagnostic intervention.                  Plan:  Proceed with Radiofrequency Ablation for the purpose of attaining long-term benefits.       "The patient has failed to respond to conservative therapies including over-the-counter medications, anti-inflammatories, muscle relaxants, membrane stabilizers, opioids, physical therapy modalities such as heat and ice, as well as more invasive techniques such as nerve blocks. Because Mr. Haas did attain more than 50% relief of the pain during a series of diagnostic blocks conducted in separate occasions, I believe it is medically necessary to proceed with Radiofrequency Ablation, in order to attempt gaining longer relief.   Medical Necessity: Mr. Lovejoy has been dealing with the above chronic pain  from the Spondylosis without myelopathy or radiculopathy, cervical region [M47.812] for  longer than three months and has either failed to respond, was unable to tolerate, or simply did not get enough benefit from other more conservative therapies including, but not limited to: 1. Over-the-counter medications 2. Anti-inflammatory medications 3. Muscle relaxants 4. Membrane stabilizers 5. Opioids 6. Physical therapy 7. Modalities (Heat, ice, etc.) 8. Invasive techniques such as nerve blocks. Mr. Bozzi has attained more than 50% relief of the pain from a series of diagnostic injections conducted in separate occasions. For this reason, I believe it is medically necessary to proceed with Radiofrequency Ablation for the purpose of attempting to prolong the duration of the benefits seen with the diagnostic injections.   Laboratory Chemistry  Inflammation Markers (CRP: Acute Phase) (ESR: Chronic Phase) Lab Results  Component Value Date   CRP <0.8 04/02/2018   ESRSEDRATE 2 04/02/2018                         Rheumatology Markers No results found.  Renal Markers Lab Results  Component Value Date   BUN 27 (H) 04/16/2018   CREATININE 1.12 04/16/2018   BCR 11 03/13/2018   GFRAA >60 04/16/2018   GFRNONAA >60 04/16/2018                             Hepatic Markers Lab Results  Component Value Date   AST 25 04/16/2018   ALT 29 04/16/2018   ALBUMIN 4.2 04/16/2018                        Neuropathy Markers Lab Results  Component Value Date   VITAMINB12 186 04/02/2018   HGBA1C 6.0 06/29/2012                        Hematology Parameters Lab Results  Component Value Date   INR 0.91 04/16/2018   LABPROT 12.2 04/16/2018   APTT 28 04/16/2018   PLT 297 04/16/2018   HGB 14.0 04/16/2018   HCT 42.2 04/16/2018                        CV Markers Lab Results  Component Value Date   CKTOTAL 43 06/28/2012   CKMB < 0.5 (L) 06/28/2012   TROPONINI <0.03 04/16/2018                         Note: Lab results reviewed.  Recent Imaging Results   Results for orders placed in  visit on 07/17/18  DG C-Arm 1-60 Min-No Report   Narrative Fluoroscopy was utilized by the requesting physician.  No radiographic  interpretation.    Interpretation Report: Fluoroscopy was used during the procedure to assist with needle guidance. The images were interpreted intraoperatively by the requesting physician.  Meds   Current Outpatient Medications:  .  amLODipine (NORVASC) 5 MG tablet, Take 5 mg by mouth daily., Disp: , Rfl:  .  aspirin 325 MG EC tablet, Take 325 mg by mouth daily., Disp: , Rfl:  .  atorvastatin (LIPITOR) 40 MG tablet, Take 40 mg by mouth daily., Disp: , Rfl:  .  cholecalciferol (VITAMIN D) 1000 units tablet, Take 1,000 Units by mouth daily., Disp: , Rfl:  .  dexlansoprazole (DEXILANT) 60 MG capsule, Take 60 mg by mouth daily., Disp: , Rfl:  .  fluticasone (FLONASE) 50 MCG/ACT nasal spray, Place 1 spray into both nostrils daily. , Disp: , Rfl:  .  Fluticasone-Umeclidin-Vilant (TRELEGY ELLIPTA) 100-62.5-25 MCG/INH AEPB, Inhale into the lungs., Disp: , Rfl:  .  lisinopril (PRINIVIL,ZESTRIL) 20 MG tablet, Take 20 mg by mouth daily., Disp: , Rfl:  .  Multiple Vitamin (MULTIVITAMIN) tablet, Take 1 tablet by mouth daily., Disp: , Rfl:  .  nortriptyline (PAMELOR) 10 MG capsule, Take 10 mg by mouth at bedtime. Patient will titrate up to 20 mg after 7 days., Disp: , Rfl: 3 .  Oxcarbazepine (TRILEPTAL) 300 MG tablet, Take 150 mg by mouth daily. , Disp: , Rfl:  .  OXYGEN, Inhale into the lungs., Disp: , Rfl:  .  pantoprazole (PROTONIX) 40 MG tablet, Take 1 tablet by mouth daily., Disp: , Rfl: 5 .  potassium citrate (UROCIT-K) 10 MEQ (1080 MG) SR tablet, Take 10 mEq by mouth 3 (three) times daily with meals., Disp: , Rfl:  .  baclofen (LIORESAL) 10 MG tablet, Take 1-2 tablets (10-20 mg total) by mouth every 6 (six) hours as needed for muscle spasms., Disp: 120 tablet, Rfl: 2 .  gabapentin (NEURONTIN) 300 MG capsule, Take 1-3 capsules (300-900 mg total) by mouth 4 (four) times  daily. Follow written titration schedule., Disp: 360 capsule, Rfl: 2 .  oxyCODONE (OXY IR/ROXICODONE) 5 MG immediate release tablet, Take 1 tablet (5 mg total) by mouth at bedtime., Disp: 30 tablet, Rfl: 0  ROS  Constitutional: Denies any fever or chills Gastrointestinal: No reported hemesis, hematochezia, vomiting, or acute GI distress Musculoskeletal: Denies any acute onset joint swelling, redness, loss of ROM, or weakness Neurological: No reported episodes of acute onset apraxia, aphasia, dysarthria, agnosia, amnesia, paralysis, loss of coordination, or loss of consciousness  Allergies  Mr. Hetz is allergic to amlodipine besy-benazepril hcl; amlodipine; and hydrochlorothiazide.  [place]  Drug: Mr. Krygier  reports that he does not use drugs. Alcohol:  reports that he does not drink alcohol. Tobacco:  reports that he has been smoking cigarettes. He has been smoking about 1.00 pack per day. He has never used smokeless tobacco. Medical:  has a past medical history of COPD (chronic obstructive pulmonary disease) ([place]), Hypertension, and Stroke ([place]). Surgical: Mr. Mcquain  has a past surgical history that includes Knee surgery and Nasal sinus surgery. Family: family history includes Cancer in his father and mother.  Constitutional Exam  General appearance: Well nourished, well developed, and well hydrated. In no apparent acute distress Vitals:   08/01/18 1352  BP: 110/75  Pulse: 88  Resp: 16  Temp: 98.7 F (37.1 C)  SpO2: 98%  Weight: 240 lb (108.9 kg)  Height: 6' (1.829 m)   BMI Assessment: Estimated body mass index is 32.55 kg/m as calculated from the following:   Height as of this encounter: 6' (1.829 m).   Weight as of this encounter: 240 lb (108.9 kg).  BMI interpretation table: BMI level Category Range association with higher incidence of chronic pain  <18 kg/m2 Underweight   18.5-24.9 kg/m2 Ideal body weight   25-29.9 kg/m2 Overweight Increased incidence by 20%   30-34.9 kg/m2 Obese (Class I) Increased incidence by 68%  35-39.9 kg/m2 Severe obesity (Class II) Increased incidence by 136%  >40 kg/m2 Extreme obesity (Class III) Increased incidence by 254%   Patient's current BMI Ideal Body weight  Body mass index is 32.55 kg/m. Ideal body weight: 77.6 kg (171 lb 1.2 oz) Adjusted ideal body weight: 90.1 kg (198 lb 10.3 oz)  BMI Readings from Last 4 Encounters:  08/01/18 32.55 kg/m  07/17/18 32.55 kg/m  06/20/18 32.55 kg/m  06/07/18 32.55 kg/m   Wt Readings from Last 4 Encounters:  08/01/18 240 lb (108.9 kg)  07/17/18 240 lb (108.9 kg)  06/20/18 240 lb (108.9 kg)  06/07/18 240 lb (108.9 kg)  Psych/Mental status: Alert, oriented x 3 (person, place, & time)       Eyes: PERLA Respiratory: No evidence of acute respiratory distress  Cervical Spine Area Exam  Skin & Axial Inspection: No masses, redness, edema, swelling, or associated skin lesions Alignment: Symmetrical Functional ROM: Decreased ROM, bilaterally Stability: No instability detected Muscle Tone/Strength: Functionally intact. No obvious neuro-muscular anomalies detected. Sensory (Neurological): Movement-associated pain Palpation: Complains of area being tender to palpation              Upper Extremity (UE) Exam    Side: Right upper extremity  Side: Left upper extremity  Skin & Extremity Inspection: Skin color, temperature, and hair growth are WNL. No peripheral edema or cyanosis. No masses, redness, swelling, asymmetry, or associated skin lesions. No contractures.  Skin & Extremity Inspection: Skin color, temperature, and hair growth are WNL. No peripheral edema or cyanosis. No masses, redness, swelling, asymmetry, or associated skin lesions. No contractures.  Functional ROM: Unrestricted ROM          Functional ROM: Unrestricted ROM          Muscle Tone/Strength: Functionally intact. No obvious neuro-muscular anomalies detected.  Muscle Tone/Strength: Functionally intact. No  obvious neuro-muscular anomalies detected.  Sensory (Neurological): Unimpaired          Sensory (Neurological): Unimpaired          Palpation: No palpable anomalies              Palpation: No palpable anomalies              Provocative Test(s):  Phalen's test: deferred Tinel's test: deferred Apley's scratch test (touch opposite shoulder):  Action 1 (Across chest): deferred Action 2 (Overhead): deferred Action 3 (LB reach): deferred   Provocative Test(s):  Phalen's test: deferred Tinel's test: deferred Apley's scratch test (touch opposite shoulder):  Action 1 (Across chest): deferred Action 2 (Overhead): deferred Action 3 (LB reach): deferred    Thoracic Spine Area Exam  Skin & Axial Inspection: No masses, redness, or swelling Alignment: Symmetrical Functional ROM: Unrestricted ROM Stability: No instability detected Muscle Tone/Strength: Functionally intact. No obvious neuro-muscular anomalies detected. Sensory (Neurological): Unimpaired Muscle strength & Tone: No palpable anomalies  Lumbar Spine Area Exam  Skin & Axial Inspection: No masses, redness, or swelling Alignment: Symmetrical Functional ROM: Unrestricted ROM       Stability: No instability detected Muscle Tone/Strength: Functionally intact. No obvious neuro-muscular anomalies detected. Sensory (Neurological): Unimpaired Palpation: No palpable anomalies       Provocative Tests: Hyperextension/rotation test: deferred today       Lumbar quadrant test (Kemp's test): deferred today       Lateral bending test: deferred today       Patrick's Maneuver: deferred today                   FABER test: deferred today                   S-I anterior distraction/compression test: deferred today         S-I lateral compression test: deferred today         S-I Thigh-thrust test: deferred today  S-I Gaenslen's test: deferred today          Gait & Posture Assessment  Ambulation: Unassisted Gait: Relatively normal for age  and body habitus Posture: WNL   Lower Extremity Exam    Side: Right lower extremity  Side: Left lower extremity  Stability: No instability observed          Stability: No instability observed          Skin & Extremity Inspection: Skin color, temperature, and hair growth are WNL. No peripheral edema or cyanosis. No masses, redness, swelling, asymmetry, or associated skin lesions. No contractures.  Skin & Extremity Inspection: Skin color, temperature, and hair growth are WNL. No peripheral edema or cyanosis. No masses, redness, swelling, asymmetry, or associated skin lesions. No contractures.  Functional ROM: Unrestricted ROM                  Functional ROM: Unrestricted ROM                  Muscle Tone/Strength: Functionally intact. No obvious neuro-muscular anomalies detected.  Muscle Tone/Strength: Functionally intact. No obvious neuro-muscular anomalies detected.  Sensory (Neurological): Unimpaired  Sensory (Neurological): Unimpaired  Palpation: No palpable anomalies  Palpation: No palpable anomalies   Assessment  Primary Diagnosis & Pertinent Problem List: The primary encounter diagnosis was Acute pain of right shoulder. Diagnoses of Cervical facet syndrome (Bilateral) (L>R), Chronic pain syndrome, Muscle spasms of neck, Chronic musculoskeletal pain, and Neurogenic pain were also pertinent to this visit.  Status Diagnosis  Unimproved Improving Persistent 1. Acute pain of right shoulder   2. Cervical facet syndrome (Bilateral) (L>R)   3. Chronic pain syndrome   4. Muscle spasms of neck   5. Chronic musculoskeletal pain   6. Neurogenic pain     Problems updated and reviewed during this visit: No problems updated. Plan of Care  Pharmacotherapy (Medications Ordered): Meds ordered this encounter  Medications  . oxyCODONE (OXY IR/ROXICODONE) 5 MG immediate release tablet    Sig: Take 1 tablet (5 mg total) by mouth at bedtime.    Dispense:  30 tablet    Refill:  0    Medication for  Chronic Pain (G89.4). [place] STOP ACT - Not applicable. Fill one day early if pharmacy is closed on scheduled refill date. Do not fill until: 08/01/18. To last until: 08/31/18  . baclofen (LIORESAL) 10 MG tablet    Sig: Take 1-2 tablets (10-20 mg total) by mouth every 6 (six) hours as needed for muscle spasms.    Dispense:  120 tablet    Refill:  2    Do not place this medication, or any other prescription from our practice, on "Automatic Refill". Patient may have prescription filled one day early if pharmacy is closed on scheduled refill date.  . gabapentin (NEURONTIN) 300 MG capsule    Sig: Take 1-3 capsules (300-900 mg total) by mouth 4 (four) times daily. Follow written titration schedule.    Dispense:  360 capsule    Refill:  2    Do not place medication on "Automatic Refill". Fill one day early if pharmacy is closed on scheduled refill date.   Medications administered today: Nikki Rusnak had no medications administered during this visit.   Procedure Orders     Radiofrequency,Cervical     SHOULDER INJECTION Lab Orders  No laboratory test(s) ordered today   Imaging Orders  No imaging studies ordered today   Referral Orders  No referral(s) requested today   Interventional  management options: Planned, scheduled, and/or pending:   Diagnostic/therapeutic right-sided intra-articular shoulder joint (acromioclavicular joint) injection #1 under fluoroscopic guidance, no sedation. Today we will also start the process of getting him approved for a right-sided cervical facet RFA # 1 under fluoroscopic guidance and mild IV sedation.   Considering:   Diagnostic bilateral lumbar epidural steroid injection Diagnostic bilateral lumbar facet nerve block Possible bilateral lumbar facet RFA Diagnostic bilateral cervical facet nerve block Possible bilateral cervical facet RFA  Diagnostic trigger point injections Diagnostic bilateral intra-articular hip injections Diagnosticgreater  occipital nerve block Diagnostic bilateral articular thumb injections   Palliative PRN treatment(s):   None at this time   Provider-requested follow-up: Return for Procedure (no sedation): (R) Shoulder BLK #1.  Future Appointments  Date Time Provider [place]  08/07/2018  7:45 AM Milinda Pointer, MD [facility] None  08/09/2018  2:15 PM Milinda Pointer, MD [facility] None  04/15/2019  1:45 PM Ernestine Conrad, Marlowe Kays None   Primary Care Physician: Remi Haggard, FNP Location: [facility] Note by: Gaspar Cola, MD Date: 08/01/2018; Time: 3:12 PM

## 2018-08-01 ENCOUNTER — Other Ambulatory Visit: Payer: Self-pay

## 2018-08-01 ENCOUNTER — Encounter: Payer: Self-pay | Admitting: Pain Medicine

## 2018-08-01 ENCOUNTER — Ambulatory Visit: Payer: Medicaid Other | Attending: Pain Medicine | Admitting: Pain Medicine

## 2018-08-01 VITALS — BP 110/75 | HR 88 | Temp 98.7°F | Resp 16 | Ht 72.0 in | Wt 240.0 lb

## 2018-08-01 DIAGNOSIS — M5137 Other intervertebral disc degeneration, lumbosacral region: Secondary | ICD-10-CM | POA: Diagnosis not present

## 2018-08-01 DIAGNOSIS — J329 Chronic sinusitis, unspecified: Secondary | ICD-10-CM | POA: Insufficient documentation

## 2018-08-01 DIAGNOSIS — Z8673 Personal history of transient ischemic attack (TIA), and cerebral infarction without residual deficits: Secondary | ICD-10-CM | POA: Insufficient documentation

## 2018-08-01 DIAGNOSIS — M47812 Spondylosis without myelopathy or radiculopathy, cervical region: Secondary | ICD-10-CM

## 2018-08-01 DIAGNOSIS — I959 Hypotension, unspecified: Secondary | ICD-10-CM | POA: Insufficient documentation

## 2018-08-01 DIAGNOSIS — K219 Gastro-esophageal reflux disease without esophagitis: Secondary | ICD-10-CM | POA: Insufficient documentation

## 2018-08-01 DIAGNOSIS — M25511 Pain in right shoulder: Secondary | ICD-10-CM | POA: Insufficient documentation

## 2018-08-01 DIAGNOSIS — F17210 Nicotine dependence, cigarettes, uncomplicated: Secondary | ICD-10-CM | POA: Insufficient documentation

## 2018-08-01 DIAGNOSIS — Z79899 Other long term (current) drug therapy: Secondary | ICD-10-CM | POA: Diagnosis not present

## 2018-08-01 DIAGNOSIS — M533 Sacrococcygeal disorders, not elsewhere classified: Secondary | ICD-10-CM | POA: Diagnosis not present

## 2018-08-01 DIAGNOSIS — M5412 Radiculopathy, cervical region: Secondary | ICD-10-CM | POA: Diagnosis not present

## 2018-08-01 DIAGNOSIS — I10 Essential (primary) hypertension: Secondary | ICD-10-CM | POA: Insufficient documentation

## 2018-08-01 DIAGNOSIS — G894 Chronic pain syndrome: Secondary | ICD-10-CM | POA: Diagnosis not present

## 2018-08-01 DIAGNOSIS — Z79891 Long term (current) use of opiate analgesic: Secondary | ICD-10-CM | POA: Diagnosis not present

## 2018-08-01 DIAGNOSIS — M792 Neuralgia and neuritis, unspecified: Secondary | ICD-10-CM

## 2018-08-01 DIAGNOSIS — G8929 Other chronic pain: Secondary | ICD-10-CM

## 2018-08-01 DIAGNOSIS — Z7982 Long term (current) use of aspirin: Secondary | ICD-10-CM | POA: Insufficient documentation

## 2018-08-01 DIAGNOSIS — J449 Chronic obstructive pulmonary disease, unspecified: Secondary | ICD-10-CM | POA: Insufficient documentation

## 2018-08-01 DIAGNOSIS — Z760 Encounter for issue of repeat prescription: Secondary | ICD-10-CM | POA: Diagnosis present

## 2018-08-01 DIAGNOSIS — M7918 Myalgia, other site: Secondary | ICD-10-CM

## 2018-08-01 DIAGNOSIS — M62838 Other muscle spasm: Secondary | ICD-10-CM | POA: Insufficient documentation

## 2018-08-01 DIAGNOSIS — Z888 Allergy status to other drugs, medicaments and biological substances status: Secondary | ICD-10-CM | POA: Insufficient documentation

## 2018-08-01 MED ORDER — GABAPENTIN 300 MG PO CAPS: 300.0000 mg | ORAL_CAPSULE | Freq: Four times a day (QID) | ORAL | 2 refills | Status: DC

## 2018-08-01 MED ORDER — OXYCODONE HCL 5 MG PO TABS: 5.0000 mg | ORAL_TABLET | Freq: Every day | ORAL | 0 refills | Status: DC

## 2018-08-01 MED ORDER — BACLOFEN 10 MG PO TABS: 10.0000 mg | ORAL_TABLET | Freq: Four times a day (QID) | ORAL | 2 refills | Status: DC | PRN

## 2018-08-01 NOTE — Patient Instructions (Signed)
____________________________________________________________________________________________  Gabapentin Titration  Medication used: Gabapentin (Generic Name) or Neurontin (Brand Name) 300 mg tablets/capsules  Reasons to stop increasing the dose:  Reason 1: You get good relief of symptoms, in which case there is no need to increase the daily dose any further.    Reason 2: You develop some side effects, such as sleeping all of the time, difficulty concentrating, or becoming disoriented, in which case you need to go down on the dose, to the prior level, where you were not experiencing any side effects. Stay on that dose longer, to allow more time for your body to get use it, before attempting to increase it again.   Reasons to stop increasing the dose: Reason 1: You get good relief of symptoms, in which case there is no need to increase the daily dose any further.  Reason 2: You develop some side effects, such as sleeping all of the time, difficulty concentrating, or becoming disoriented, in which case you need to go down on the dose, to the prior level, where you were not experiencing any side effects. Stay on that dose longer, to allow more time for your body to get use it, before attempting to increase it again.  Steps to increase medication: Step 1: Start by taking 1 (one) tablet at bedtime x 7 (seven) days.  Step 2: After 7 (seven) days of taking 1 (one) tablet at bedtime, increase it to 2 (two) tablets at bedtime. Stay on this dose x 7 (seven) days.  Step 3: After 7 (seven) days of taking 2 (two) tablets at bedtime, increase it to 3 (three) tablets at bedtime. Stay on this dose x another 7 (seven) days.  Step 4: After 7 (seven) days of taking 3 (three) tablet at bedtime, begin taking 1 (one) tablet at noon with lunch. Stay on this dose x another 7 (seven) days.  Step 5: After 7 (seven) days of taking 3 (three) tablet at bedtime, and 1 (one) tablet at noon, then begin taking 1 (one)  tablet in the afternoon with dinner. Stay on this dose x another 7 (seven) days.  Step 6: After 7 (seven) days of taking 3 (three) tablet at bedtime, 1 (one) tablet at noon, and 1 (one) tablet in the afternoon, then begin taking 1 (one) tablet in the morning with breakfast. Stay on this dose x another 7 (seven) days. At this point you should be taking the medicine 4 (four) times a day, or about every 6 (six) hours. This daily regimen of taking the medicine 4 (four) times a day, will be maintained from now on. You should not take any doses any sooner than every 6 (six) hours.  Step 7: After 7 (seven) days of taking 3 (three) tablet at bedtime, 1 (one) tablet at noon, 1 (one) tablet in the afternoon, and 1 (one) tablet in the morning, begin taking 2 (two) tablets at noon with lunch. Stay on this dose x another 7 (seven) days.   Step 8: After 7 (seven) days of taking 3 (three) tablet at bedtime, 2 (two) tablets at noon, 1 (one) tablet in the afternoon, and 1 (one) tablet in the morning, begin taking 2 (two) tablets in the afternoon with dinner. Stay on this dose x another 7 (seven) days.   Step 9: After 7 (seven) days of taking 3 (three) tablet at bedtime, 2 (two) tablets at noon, 2 (two) tablets in the afternoon, and 1 (one) tablet in the morning, begin taking 2 (two) tablets in  the morning with breakfast. Stay on this dose x another 7 (seven) days. At this point you should be taking the medicine 4 (four) times a day, or about every 6 (six) hours. This daily regimen of taking the medicine 4 (four) times a day, will be maintained from now on. You should not take any doses any sooner than every 6 (six) hours.  Step 10: After 7 (seven) days of taking 3 (three) tablet at bedtime, 2 (two) tablets at noon, 2 (two) tablets in the afternoon, and 2 (two) tablets in the morning, begin taking 3 (three) tablets at noon with lunch. Stay on this dose x another 7 (seven) days.   Step 11: After 7 (seven) days of taking 3  (three) tablet at bedtime, 3 (three) tablets at noon, 2 (two) tablets in the afternoon, and 2 (two) tablets in the morning, begin taking 3 (three) tablets in the afternoon with dinner. Stay on this dose x another 7 (seven) days.   Step 12: After 7 (seven) days of taking 3 (three) tablet at bedtime, 3 (three) tablets at noon, 3 (three) tablets in the afternoon, and 2 (two) tablet in the morning, begin taking 3 (three) tablets in the morning with breakfast. Stay on this dose x another 7 (seven) days. At this point you should be taking the medicine 4 (four) times a day, or about every 6 (six) hours. This daily regimen of taking the medicine 4 (four) times a day, will be maintained from now on.   Endpoint: Once you have reached the maximum dose you can tolerate without side-effects, contact your physician so as to evaluate the results of the regimen.   Questions: Feel free to contact us for any questions or problems at [phone] ____________________________________________________________________________________________  ____________________________________________________________________________________________  Preparing for Procedure with Sedation  Instructions: . Oral Intake: Do not eat or drink anything for at least 8 hours prior to your procedure. . Transportation: Public transportation is not allowed. Bring an adult driver. The driver must be physically present in our waiting room before any procedure can be started. Marland Kitchen Physical Assistance: Bring an adult physically capable of assisting you, in the event you need help. This adult should keep you company at home for at least 6 hours after the procedure. . Blood Pressure Medicine: Take your blood pressure medicine with a sip of water the morning of the procedure. . Blood thinners: Notify our staff if you are taking any blood thinners. Depending on which one you take, there will be specific instructions on how and when to stop it. . Diabetics  on insulin: Notify the staff so that you can be scheduled 1st case in the morning. If your diabetes requires high dose insulin, take only  of your normal insulin dose the morning of the procedure and notify the staff that you have done so. . Preventing infections: Shower with an antibacterial soap the morning of your procedure. . Build-up your immune system: Take 1000 mg of Vitamin C with every meal (3 times a day) the day prior to your procedure. Marland Kitchen Antibiotics: Inform the staff if you have a condition or reason that requires you to take antibiotics before dental procedures. . Pregnancy: If you are pregnant, call and cancel the procedure. . Sickness: If you have a cold, fever, or any active infections, call and cancel the procedure. . Arrival: You must be in the facility at least 30 minutes prior to your scheduled procedure. . Children: Do not bring children with you. Percell Miller  appropriately: Bring dark clothing that you would not mind if they get stained. . Valuables: Do not bring any jewelry or valuables.  Procedure appointments are reserved for interventional treatments only. Marland Kitchen No Prescription Refills. . No medication changes will be discussed during procedure appointments. . No disability issues will be discussed.  Reasons to call and reschedule or cancel your procedure: (Following these recommendations will minimize the risk of a serious complication.) . Surgeries: Avoid having procedures within 2 weeks of any surgery. (Avoid for 2 weeks before or after any surgery). . Flu Shots: Avoid having procedures within 2 weeks of a flu shots or . (Avoid for 2 weeks before or after immunizations). . Barium: Avoid having a procedure within 7-10 days after having had a radiological study involving the use of radiological contrast. (Myelograms, Barium swallow or enema study). . Heart attacks: Avoid any elective procedures or surgeries for the initial 6 months after a "Myocardial Infarction" (Heart  Attack). . Blood thinners: It is imperative that you stop these medications before procedures. Let us know if you if you take any blood thinner.  . Infection: Avoid procedures during or within two weeks of an infection (including chest colds or gastrointestinal problems). Symptoms associated with infections include: Localized redness, fever, chills, night sweats or profuse sweating, burning sensation when voiding, cough, congestion, stuffiness, runny nose, sore throat, diarrhea, nausea, vomiting, cold or Flu symptoms, recent or current infections. It is specially important if the infection is over the area that we intend to treat. Marland Kitchen Heart and lung problems: Symptoms that may suggest an active cardiopulmonary problem include: cough, chest pain, breathing difficulties or shortness of breath, dizziness, ankle swelling, uncontrolled high or unusually low blood pressure, and/or palpitations. If you are experiencing any of these symptoms, cancel your procedure and contact your primary care physician for an evaluation.  Remember:  Regular Business hours are:  Monday to Thursday 8:00 AM to 4:00 PM  Provider's Schedule: Milinda Pointer, MD:  Procedure days: Tuesday and Thursday 7:30 AM to 4:00 PM  Gillis Santa, MD:  Procedure days: Monday and Wednesday 7:30 AM to 4:00 PM ____________________________________________________________________________________________

## 2018-08-01 NOTE — Progress Notes (Signed)
Nursing Pain Medication Assessment:  Safety precautions to be maintained throughout the outpatient stay will include: orient to surroundings, keep bed in low position, maintain call bell within reach at all times, provide assistance with transfer out of bed and ambulation.  Medication Inspection Compliance: Pill count conducted under aseptic conditions, in front of the patient. Neither the pills nor the bottle was removed from the patient's sight at any time. Once count was completed pills were immediately returned to the patient in their original bottle.  Medication: Oxycodone HCL 5mg  Pill/Patch Count: 0 of 30 pills remain Pill/Patch Appearance: Markings consistent with prescribed medication Bottle Appearance: Standard pharmacy container. Clearly labeled. Filled Date: 10 / 04 / 2019 Last Medication intake:  Ran out of medicine more than 48 hours ago

## 2018-08-06 ENCOUNTER — Telehealth: Payer: Self-pay | Admitting: *Deleted

## 2018-08-06 NOTE — Telephone Encounter (Signed)
Spoke with patient and he states that he is on an antibiotic for possible ear infection and vertigo.  He will finish the antibiotic tomorrow but does not feel that it is working.  I told patient that if he could get the ear looked at again to make sure it is clear he could proceed with procedures otherwise we would need to cancel until he knows for sure.  He will try and get in to see PCP tomorrow to see if ear is clear and will let us know if he needs to cancel.

## 2018-08-07 ENCOUNTER — Encounter: Payer: Self-pay | Admitting: Pain Medicine

## 2018-08-07 ENCOUNTER — Other Ambulatory Visit: Payer: Self-pay

## 2018-08-07 ENCOUNTER — Ambulatory Visit
Admission: RE | Admit: 2018-08-07 | Discharge: 2018-08-07 | Disposition: A | Discharge status: Home / Self Care | Payer: Medicaid Other | Source: Ambulatory Visit | Attending: Pain Medicine | Admitting: Pain Medicine

## 2018-08-07 ENCOUNTER — Ambulatory Visit: Payer: Medicaid Other | Admitting: Pain Medicine

## 2018-08-07 VITALS — BP 134/80 | HR 87 | Temp 98.5°F | Resp 14 | Ht 72.0 in | Wt 242.0 lb

## 2018-08-07 DIAGNOSIS — Z7982 Long term (current) use of aspirin: Secondary | ICD-10-CM | POA: Insufficient documentation

## 2018-08-07 DIAGNOSIS — M25511 Pain in right shoulder: Principal | ICD-10-CM

## 2018-08-07 DIAGNOSIS — Z7901 Long term (current) use of anticoagulants: Secondary | ICD-10-CM | POA: Diagnosis not present

## 2018-08-07 DIAGNOSIS — Z888 Allergy status to other drugs, medicaments and biological substances status: Secondary | ICD-10-CM | POA: Diagnosis not present

## 2018-08-07 DIAGNOSIS — G8929 Other chronic pain: Secondary | ICD-10-CM

## 2018-08-07 DIAGNOSIS — Z79899 Other long term (current) drug therapy: Secondary | ICD-10-CM | POA: Diagnosis not present

## 2018-08-07 MED ORDER — LIDOCAINE HCL 2 % IJ SOLN
20.0000 mL | Freq: Once | INTRAMUSCULAR | Status: AC
  Administered 2018-08-07: 400 mg
  Filled 2018-08-07: qty 20

## 2018-08-07 MED ORDER — ROPIVACAINE HCL 2 MG/ML IJ SOLN
9.0000 mL | Freq: Once | INTRAMUSCULAR | Status: AC
  Administered 2018-08-07: 10 mL via INTRA_ARTICULAR
  Filled 2018-08-07: qty 10

## 2018-08-07 MED ORDER — METHYLPREDNISOLONE ACETATE 80 MG/ML IJ SUSP
80.0000 mg | Freq: Once | INTRAMUSCULAR | Status: AC
  Administered 2018-08-07: 80 mg via INTRA_ARTICULAR
  Filled 2018-08-07: qty 1

## 2018-08-07 NOTE — Progress Notes (Signed)
Safety precautions to be maintained throughout the outpatient stay will include: orient to surroundings, keep bed in low position, maintain call bell within reach at all times, provide assistance with transfer out of bed and ambulation.  

## 2018-08-07 NOTE — Progress Notes (Signed)
Patient's Name: Mark Vang  MRN: [id number]  Referring Provider: Remi Haggard, FNP  DOB: 14-Apr-1961  PCP: Remi Haggard, FNP  DOS: 08/07/2018  Note by: Gaspar Cola, MD  Service setting: Ambulatory outpatient  Specialty: Interventional Pain Management  Patient type: Established  Location: [facility] (AMB) Pain Management Facility  Visit type: Interventional Procedure   Primary Reason for Visit: Interventional Pain Management Treatment. CC: Shoulder Pain (right)  Procedure:          Anesthesia, Analgesia, Anxiolysis:  Type: Diagnostic Glenohumeral Joint (shoulder) Injection #1  CPT: 20610      Primary Purpose: Diagnostic Region: Superior Shoulder Area Level:  Shoulder Target Area: Glenohumeral Joint (shoulder) Approach: Anterior approach. Laterality: Right-Sided  Type: Local Anesthesia Indication(s): Analgesia         Route: Infiltration ([place]/IM) IV Access: Declined Sedation: Declined  Local Anesthetic: Lidocaine 1-2%  Position: Supine   Indications: 1. Chronic right shoulder pain    Pain Score: Pre-procedure: 8 /10 Post-procedure: 0-No pain/10  Pre-op Assessment:  Mark Vang is a 57 y.o. (year old), male patient, seen today for interventional treatment. He  has a past surgical history that includes Knee surgery and Nasal sinus surgery. Mark Vang has a current medication list which includes the following prescription(s): amlodipine, aspirin, atorvastatin, baclofen, cholecalciferol, dexlansoprazole, fluticasone, fluticasone-umeclidin-vilant, gabapentin, lisinopril, multivitamin, nortriptyline, oxcarbazepine, oxycodone, oxygen-helium, pantoprazole, and potassium citrate. His primarily concern today is the Shoulder Pain (right)  Initial Vital Signs:  Pulse/HCG Rate: 87ECG Heart Rate: 75 Temp: 98.5 F (36.9 C) Resp: 18 BP: 132/75 SpO2: 97 %  BMI: Estimated body mass index is 32.82 kg/m as calculated from the following:   Height as of this encounter: 6' (1.829  m).   Weight as of this encounter: 242 lb (109.8 kg).  Risk Assessment: Allergies: Reviewed. He is allergic to amlodipine besy-benazepril hcl; amlodipine; and hydrochlorothiazide.  Allergy Precautions: None required Coagulopathies: Reviewed. None identified.  Blood-thinner therapy: None at this time Active Infection(s): Reviewed. None identified. Mark Vang is afebrile  Site Confirmation: Mark Vang was asked to confirm the procedure and laterality before marking the site Procedure checklist: Completed Consent: Before the procedure and under the influence of no sedative(s), amnesic(s), or anxiolytics, the patient was informed of the treatment options, risks and possible complications. To fulfill our ethical and legal obligations, as recommended by the American Medical Association's Code of Ethics, I have informed the patient of my clinical impression; the nature and purpose of the treatment or procedure; the risks, benefits, and possible complications of the intervention; the alternatives, including doing nothing; the risk(s) and benefit(s) of the alternative treatment(s) or procedure(s); and the risk(s) and benefit(s) of doing nothing. The patient was provided information about the general risks and possible complications associated with the procedure. These may include, but are not limited to: failure to achieve desired goals, infection, bleeding, organ or nerve damage, allergic reactions, paralysis, and death. In addition, the patient was informed of those risks and complications associated to the procedure, such as failure to decrease pain; infection; bleeding; organ or nerve damage with subsequent damage to sensory, motor, and/or autonomic systems, resulting in permanent pain, numbness, and/or weakness of one or several areas of the body; allergic reactions; (i.e.: anaphylactic reaction); and/or death. Furthermore, the patient was informed of those risks and complications associated with the  medications. These include, but are not limited to: allergic reactions (i.e.: anaphylactic or anaphylactoid reaction(s)); adrenal axis suppression; blood sugar elevation that in diabetics may result in ketoacidosis or comma;  water retention that in patients with history of congestive heart failure may result in shortness of breath, pulmonary edema, and decompensation with resultant heart failure; weight gain; swelling or edema; medication-induced neural toxicity; particulate matter embolism and blood vessel occlusion with resultant organ, and/or nervous system infarction; and/or aseptic necrosis of one or more joints. Finally, the patient was informed that Medicine is not an exact science; therefore, there is also the possibility of unforeseen or unpredictable risks and/or possible complications that may result in a catastrophic outcome. The patient indicated having understood very clearly. We have given the patient no guarantees and we have made no promises. Enough time was given to the patient to ask questions, all of which were answered to the patient's satisfaction. Mark Vang has indicated that he wanted to continue with the procedure. Attestation: I, the ordering provider, attest that I have discussed with the patient the benefits, risks, side-effects, alternatives, likelihood of achieving goals, and potential problems during recovery for the procedure that I have provided informed consent. Date  Time: 08/07/2018  1:56 PM  Pre-Procedure Preparation:  Monitoring: As per clinic protocol. Respiration, ETCO2, SpO2, BP, heart rate and rhythm monitor placed and checked for adequate function Safety Precautions: Patient was assessed for positional comfort and pressure points before starting the procedure. Time-out: I initiated and conducted the "Time-out" before starting the procedure, as per protocol. The patient was asked to participate by confirming the accuracy of the "Time Out" information. Verification  of the correct person, site, and procedure were performed and confirmed by me, the nursing staff, and the patient. "Time-out" conducted as per Joint Commission's Universal Protocol (UP.01.01.01). Time: 1416  Description of Procedure:          Area Prepped: Entire shoulder Area Prepping solution: ChloraPrep (2% chlorhexidine gluconate and 70% isopropyl alcohol) Safety Precautions: Aspiration looking for blood return was conducted prior to all injections. At no point did we inject any substances, as a needle was being advanced. No attempts were made at seeking any paresthesias. Safe injection practices and needle disposal techniques used. Medications properly checked for expiration dates. SDV (single dose vial) medications used. Description of the Procedure: Protocol guidelines were followed. The patient was placed in position over the procedure table. The target area was identified and the area prepped in the usual manner. Skin & deeper tissues infiltrated with local anesthetic. Appropriate amount of time allowed to pass for local anesthetics to take effect. The procedure needles were then advanced to the target area. Proper needle placement secured. Negative aspiration confirmed. Solution injected in intermittent fashion, asking for systemic symptoms every 0.5cc of injectate. The needles were then removed and the area cleansed, making sure to leave some of the prepping solution back to take advantage of its long term bactericidal properties.    Vitals:   08/07/18 1355 08/07/18 1415 08/07/18 1420  BP: 132/75 127/77 134/80  Pulse: 87    Resp: 18 19 14   Temp: 98.5 F (36.9 C)    TempSrc: Oral    SpO2: 97% 99% 97%  Weight: 242 lb (109.8 kg)    Height: 6' (1.829 m)      Start Time: 1416 hrs. End Time: 1419 hrs. Materials:  Needle(s) Type: Spinal Needle Gauge: 22G Length: 3.5-in Medication(s): Please see orders for medications and dosing details.  Imaging Guidance (Non-Spinal):            Type of Imaging Technique: Fluoroscopy Guidance (Non-Spinal) Indication(s): Assistance in needle guidance and placement for procedures requiring needle placement in  or near specific anatomical locations not easily accessible without such assistance. Exposure Time: Please see nurses notes. Contrast: None used. Fluoroscopic Guidance: I was personally present during the use of fluoroscopy. "Tunnel Vision Technique" used to obtain the best possible view of the target area. Parallax error corrected before commencing the procedure. "Direction-depth-direction" technique used to introduce the needle under continuous pulsed fluoroscopy. Once target was reached, antero-posterior, oblique, and lateral fluoroscopic projection used confirm needle placement in all planes. Images permanently stored in EMR. Interpretation: No contrast injected. I personally interpreted the imaging intraoperatively. Adequate needle placement confirmed in multiple planes. Permanent images saved into the patient's record.  Antibiotic Prophylaxis:   Anti-infectives (From admission, onward)   None     Indication(s): None identified  Post-operative Assessment:  Post-procedure Vital Signs:  Pulse/HCG Rate: 8782 Temp: 98.5 F (36.9 C) Resp: 14 BP: 134/80 SpO2: 97 %  EBL: None  Complications: No immediate post-treatment complications observed by team, or reported by patient.  Note: The patient tolerated the entire procedure well. A repeat set of vitals were taken after the procedure and the patient was kept under observation following institutional policy, for this type of procedure. Post-procedural neurological assessment was performed, showing return to baseline, prior to discharge. The patient was provided with post-procedure discharge instructions, including a section on how to identify potential problems. Should any problems arise concerning this procedure, the patient was given instructions to immediately contact us, at  any time, without hesitation. In any case, we plan to contact the patient by telephone for a follow-up status report regarding this interventional procedure.  Comments:  No additional relevant information.  Plan of Care    Imaging Orders     DG Shoulder Right     DG C-Arm 1-60 Min-No Report  Procedure Orders     SHOULDER INJECTION  Medications ordered for procedure: Meds ordered this encounter  Medications  . lidocaine (XYLOCAINE) 2 % (with pres) injection 400 mg  . ropivacaine (PF) 2 mg/mL (0.2%) (NAROPIN) injection 9 mL  . methylPREDNISolone acetate (DEPO-MEDROL) injection 80 mg   Medications administered: We administered lidocaine, ropivacaine (PF) 2 mg/mL (0.2%), and methylPREDNISolone acetate.  See the medical record for exact dosing, route, and time of administration.  Disposition: Discharge home  Discharge Date & Time: 08/07/2018; 1423 hrs.   Physician-requested Follow-up: No follow-ups on file.  Future Appointments  Date Time Provider [place]  08/09/2018  2:15 PM Milinda Pointer, MD [facility] None  04/15/2019  1:45 PM [place], Marlowe Kays None   Primary Care Physician: Remi Haggard, FNP Location: [facility] Outpatient Pain Management Facility Note by: Gaspar Cola, MD Date: 08/07/2018; Time: 2:40 PM  Disclaimer:  Medicine is not an Chief Strategy Officer. The only guarantee in medicine is that nothing is guaranteed. It is important to note that the decision to proceed with this intervention was based on the information collected from the patient. The Data and conclusions were drawn from the patient's questionnaire, the interview, and the physical examination. Because the information was provided in large part by the patient, it cannot be guaranteed that it has not been purposely or unconsciously manipulated. Every effort has been made to obtain as much relevant data as possible for this evaluation. It is important to note that the conclusions that  lead to this procedure are derived in large part from the available data. Always take into account that the treatment will also be dependent on availability of resources and existing treatment guidelines, considered by other  Pain Management Practitioners as being common knowledge and practice, at the time of the intervention. For Medico-Legal purposes, it is also important to point out that variation in procedural techniques and pharmacological choices are the acceptable norm. The indications, contraindications, technique, and results of the above procedure should only be interpreted and judged by a Board-Certified Interventional Pain Specialist with extensive familiarity and expertise in the same exact procedure and technique.

## 2018-08-07 NOTE — Patient Instructions (Signed)

## 2018-08-09 ENCOUNTER — Ambulatory Visit: Payer: Medicaid Other | Admitting: Pain Medicine

## 2018-08-09 ENCOUNTER — Telehealth: Payer: Self-pay

## 2018-08-09 NOTE — Telephone Encounter (Signed)
Post procedure phone call.  Patient states whe is doing well.

## 2018-08-17 ENCOUNTER — Telehealth: Payer: Self-pay | Admitting: Pain Medicine

## 2018-08-17 NOTE — Telephone Encounter (Signed)
Patient called stating he received letter telling him medicaid has denied his meds due to missing information. Please check and send prior auth again. Let patient know status. Thank you

## 2018-08-17 NOTE — Telephone Encounter (Signed)
PA resent.  Attempted to notify patient.  Call would not go through.

## 2018-09-28 ENCOUNTER — Telehealth: Payer: Self-pay | Admitting: Nurse Practitioner

## 2018-09-28 NOTE — Telephone Encounter (Signed)
Patient called stating his pharmacy has closed Mark Vang), he needs scripts sent to [facility] in [place]  Please let patient know status, he is due to refill 10-02-18

## 2018-09-28 NOTE — Telephone Encounter (Signed)
Called patient, his pharm closed and would like to have his prescriptions filled at [facility] on [address]. He runs out on 10/02/2018 and would like if you can send new prescription. Needs Hydrocodone, Gabepentin, Baclofen. Thanks

## 2018-10-01 ENCOUNTER — Other Ambulatory Visit: Payer: Self-pay | Admitting: Nurse Practitioner

## 2018-10-01 DIAGNOSIS — M792 Neuralgia and neuritis, unspecified: Secondary | ICD-10-CM

## 2018-10-01 DIAGNOSIS — M7918 Myalgia, other site: Secondary | ICD-10-CM

## 2018-10-01 DIAGNOSIS — G8929 Other chronic pain: Secondary | ICD-10-CM

## 2018-10-01 DIAGNOSIS — M62838 Other muscle spasm: Secondary | ICD-10-CM

## 2018-10-01 MED ORDER — GABAPENTIN 300 MG PO CAPS: 300.0000 mg | ORAL_CAPSULE | Freq: Four times a day (QID) | ORAL | 0 refills | Status: DC

## 2018-10-01 MED ORDER — BACLOFEN 10 MG PO TABS: 10.0000 mg | ORAL_TABLET | Freq: Four times a day (QID) | ORAL | 0 refills | Status: DC | PRN

## 2018-10-01 NOTE — Telephone Encounter (Signed)
He needs an apt with Dr Lowella Dandy. Please check with the Rx Gaba and Baclofen has refills that should have gone over His last apt was 11/12, next apt 1/16. There was only one Rx for the Oxycodone.

## 2018-10-01 NOTE — Telephone Encounter (Signed)
Called and left message that Gabapentin and Baclofen was called in to his pharm by cKing . Only had One month of oxy and will need to talk to MD on his next appt.

## 2018-10-01 NOTE — Telephone Encounter (Signed)
[facility] [place] . [facility] do not have any Prescription transfers for Baclofen or Gabapentin. Please advised.

## 2018-10-01 NOTE — Telephone Encounter (Signed)
Ok I will reorder those only thanks

## 2018-10-03 ENCOUNTER — Telehealth: Payer: Self-pay | Admitting: Pain Medicine

## 2018-10-03 NOTE — Telephone Encounter (Signed)
I do not see that this patient has a current Rx.  Last was written on 08/01/18 to last until 08/31/18.

## 2018-10-03 NOTE — Telephone Encounter (Signed)
Please Ask Dr. Dossie Arbour if he is going to write another script for pain meds and let patient know

## 2018-10-03 NOTE — Telephone Encounter (Signed)
I don't see that this patient has a current Rx.  Last written on 08/01/18 to last until 08/31/18.  I don't see anything more than that.

## 2018-10-03 NOTE — Telephone Encounter (Signed)
Patient left msg about needing pain meds transferred from [facility] He was supposed to pick up meds on 10-02-18. Please call him and let him know where this stands.

## 2018-10-03 NOTE — Telephone Encounter (Signed)
Advised patient to speak with Dr. Dossie Arbour about this at his appointment on 10-11-18. Marland Kitchen

## 2018-10-03 NOTE — Telephone Encounter (Signed)
Pt left a voicemail stating that his pharmacy closed down and he needs [facility] [address]

## 2018-10-11 ENCOUNTER — Ambulatory Visit: Payer: Medicaid Other | Admitting: Pain Medicine

## 2018-11-19 ENCOUNTER — Other Ambulatory Visit: Payer: Self-pay | Admitting: Nurse Practitioner

## 2018-11-19 DIAGNOSIS — G8929 Other chronic pain: Secondary | ICD-10-CM

## 2018-11-19 DIAGNOSIS — M62838 Other muscle spasm: Secondary | ICD-10-CM

## 2018-11-19 DIAGNOSIS — M7918 Myalgia, other site: Secondary | ICD-10-CM

## 2019-01-09 ENCOUNTER — Other Ambulatory Visit: Payer: Self-pay | Admitting: Nurse Practitioner

## 2019-01-09 DIAGNOSIS — M792 Neuralgia and neuritis, unspecified: Secondary | ICD-10-CM

## 2019-04-15 ENCOUNTER — Ambulatory Visit: Payer: Medicaid Other | Admitting: Urology

## 2019-04-19 ENCOUNTER — Other Ambulatory Visit: Payer: Self-pay | Admitting: Family Medicine

## 2019-04-19 DIAGNOSIS — N200 Calculus of kidney: Secondary | ICD-10-CM

## 2019-04-22 ENCOUNTER — Ambulatory Visit
Admission: RE | Admit: 2019-04-22 | Discharge: 2019-04-22 | Disposition: A | Discharge status: Home / Self Care | Payer: Medicaid Other | Source: Ambulatory Visit | Attending: Urology | Admitting: Urology

## 2019-04-22 ENCOUNTER — Other Ambulatory Visit: Payer: Self-pay

## 2019-04-22 DIAGNOSIS — N200 Calculus of kidney: Secondary | ICD-10-CM | POA: Diagnosis present

## 2019-04-22 NOTE — Progress Notes (Signed)
04/23/2019 3:47 PM   Mark Vang 05-Dec-1960 [id number]  Referring provider: Remi Haggard, [place] [place],  [place] [address]  Chief Complaint  Patient presents with  . Nephrolithiasis    HPI: Patient is a 58 year old male with a history of nephrolithiasis who presents today for yearly follow up.   Background history Patient is a 59 year old Caucasian male who was referred by Threasa Alpha, NP for possible bilateral nephrolithiasis.  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  He states that he had a RUS at his PCP office and was told he had 12 stones in his right kidney and 20 stones in the left kidney.  He has been having intermittent pain in his left lower quadrant that radiates to the left flank pain.  This started two months ago.  He states the pain has not been present for the last week.  He states the pain can be intense.  Patient denies any gross hematuria, dysuria or suprapubic/flank pain.  Patient denies any fevers, chills, nausea or vomiting.   His only urinary complaint at this time is nocturia.  His UA is negative.    CT Renal stone study on 04/06/2018 noted normal adrenal glands.  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 Left upper pole calculus measures 4 mm. No ureteral calculi identified bilaterally. No mass or hydronephrosis. Urinary bladder appears normal.  KUB 04/22/2019 no stones seen.       PMH: Past Medical History:  Diagnosis Date  . COPD (chronic obstructive pulmonary disease) ([place])   . Hypertension   . Stroke [facility])     Surgical History: Past Surgical History:  Procedure Laterality Date  . KNEE SURGERY    . NASAL SINUS SURGERY      Home Medications:  Allergies as of 04/23/2019      Reactions   Amlodipine Besy-benazepril Hcl Other (See Comments)   Scales, burning, itching, rash   Amlodipine    Hydrochlorothiazide       Medication List       Accurate as of April 23, 2019  3:47 PM. If you have  any questions, ask your nurse or doctor.        STOP taking these medications   oxyCODONE 5 MG immediate release tablet Commonly known as: Oxy IR/ROXICODONE Stopped by: Rin Gorton, PA-C     TAKE these medications   amLODipine 5 MG tablet Commonly known as: NORVASC Take 5 mg by mouth daily.   aspirin 325 MG EC tablet Take 325 mg by mouth daily.   atorvastatin 40 MG tablet Commonly known as: LIPITOR Take 40 mg by mouth daily.   baclofen 10 MG tablet Commonly known as: LIORESAL Take 1-2 tablets (10-20 mg total) by mouth every 6 (six) hours as needed for muscle spasms.   cholecalciferol 1000 units tablet Commonly known as: VITAMIN D Take 1,000 Units by mouth daily.   dexlansoprazole 60 MG capsule Commonly known as: DEXILANT Take 60 mg by mouth daily.   fluticasone 50 MCG/ACT nasal spray Commonly known as: FLONASE Place 1 spray into both nostrils daily.   gabapentin 300 MG capsule Commonly known as: NEURONTIN Take 1-3 capsules (300-900 mg total) by mouth 4 (four) times daily.   lisinopril 20 MG tablet Commonly known as: ZESTRIL Take 20 mg by mouth daily.   multivitamin tablet Take 1 tablet by mouth daily.   nortriptyline 10 MG capsule Commonly known as: PAMELOR Take 10 mg by mouth  at bedtime. Patient will titrate up to 20 mg after 7 days.   Oxcarbazepine 300 MG tablet Commonly known as: TRILEPTAL Take 150 mg by mouth daily.   OXYGEN Inhale into the lungs.   pantoprazole 40 MG tablet Commonly known as: PROTONIX Take 1 tablet by mouth daily.   potassium citrate 10 MEQ (1080 MG) SR tablet Commonly known as: UROCIT-K Take 10 mEq by mouth 3 (three) times daily with meals.   tamsulosin 0.4 MG Caps capsule Commonly known as: FLOMAX Take 1 capsule (0.4 mg total) by mouth daily. Started by: Zara Council, PA-C   Trelegy Ellipta 100-62.5-25 MCG/INH Aepb Generic drug: Fluticasone-Umeclidin-Vilant Inhale into the lungs.       Allergies:   Allergies  Allergen Reactions  . Amlodipine Besy-Benazepril Hcl Other (See Comments)    Scales, burning, itching, rash  . Amlodipine   . Hydrochlorothiazide     Family History: Family History  Problem Relation Age of Onset  . Cancer Mother   . Cancer Father   . Bladder Cancer Neg Hx   . Kidney cancer Neg Hx   . Prostate cancer Neg Hx     Social History:  reports that he has been smoking cigarettes. He has been smoking about 1.00 pack per day. He has never used smokeless tobacco. He reports that he does not drink alcohol or use drugs.  ROS: UROLOGY Frequent Urination?: No Hard to postpone urination?: No Burning/pain with urination?: No Get up at night to urinate?: Yes Leakage of urine?: No Urine stream starts and stops?: No Trouble starting stream?: No Do you have to strain to urinate?: No Blood in urine?: No Urinary tract infection?: No Sexually transmitted disease?: No Injury to kidneys or bladder?: No Painful intercourse?: No Weak stream?: No Erection problems?: No Penile pain?: No  Gastrointestinal Nausea?: No Vomiting?: No Indigestion/heartburn?: No Diarrhea?: No Constipation?: No  Constitutional Fever: No Night sweats?: No Weight loss?: No Fatigue?: No  Skin Skin rash/lesions?: No Itching?: No  Eyes Blurred vision?: No Double vision?: No  Ears/Nose/Throat Sore throat?: No Sinus problems?: No  Hematologic/Lymphatic Swollen glands?: No Easy bruising?: No  Cardiovascular Leg swelling?: No Chest pain?: No  Respiratory Cough?: No Shortness of breath?: Yes  Endocrine Excessive thirst?: No  Musculoskeletal Back pain?: Yes Joint pain?: Yes  Neurological Headaches?: No Dizziness?: Yes  Psychologic Depression?: No Anxiety?: No  Physical Exam: BP 121/72 (BP Location: Left Arm, Patient Position: Sitting, Cuff Size: Normal)   Pulse 90   Ht 6' (1.829 m)   Wt 234 lb (106.1 kg)   BMI 31.74 kg/m   Constitutional:  Well nourished.  Alert and oriented, No acute distress. HEENT: [place] AT, moist mucus membranes.  Trachea midline, no masses. Cardiovascular: No clubbing, cyanosis, or edema. Respiratory: Normal respiratory effort, no increased work of breathing. Neurologic: Grossly intact, no focal deficits, moving all 4 extremities. Psychiatric: Normal mood and affect. Patient refused the rest of his exam.    Laboratory Data: Lab Results  Component Value Date   WBC 17.9 (H) 04/16/2018   HGB 14.0 04/16/2018   HCT 42.2 04/16/2018   MCV 86.2 04/16/2018   PLT 297 04/16/2018    Lab Results  Component Value Date   CREATININE 1.12 04/16/2018    No results found for: PSA  No results found for: TESTOSTERONE  Lab Results  Component Value Date   HGBA1C 6.0 06/29/2012    No results found for: TSH     Component Value Date/Time   CHOL 143 10/02/2013 0451  HDL 21 (L) 10/02/2013 0451   VLDL 31 10/02/2013 0451   LDLCALC 91 10/02/2013 0451    Lab Results  Component Value Date   AST 25 04/16/2018   Lab Results  Component Value Date   ALT 29 04/16/2018   No components found for: ALKALINEPHOPHATASE No components found for: BILIRUBINTOTAL  No results found for: ESTRADIOL    I have reviewed the labs.  Pertinent Imaging CLINICAL DATA:  Bilateral nephrolithiasis.  EXAM: ABDOMEN - 1 VIEW  COMPARISON:  CT scan 04/06/2018  FINDINGS: The lung bases are grossly clear. The bowel gas pattern is unremarkable. No definite renal or ureteral calculi. Stable phlebolith a calcifications in the pelvis.  IMPRESSION: No definite renal or ureteral calculi.   Electronically Signed   By: Marijo Sanes M.D.   On: 04/22/2019 16:35 I have independently reviewed the films and do not appreciate nephrolithiasis.   Assessment & Plan:    1. Bilateral nephrolithiasis None seen on KUB Monitor with yearly KUB's - refilled Flomax  Advised to contact our office or seek treatment in the ED if becomes febrile or pain/  vomiting are difficult control in order to arrange for emergent/urgent intervention  2. PSA screening Will request PSA's from PCP  Return in about 1 year (around 04/22/2020) for KUB and office .  These notes generated with voice recognition software. I apologize for typographical errors.  Zara Council, PA-C  [facility] [address]  [place], [place] [address] [phone]

## 2019-04-23 ENCOUNTER — Encounter: Payer: Self-pay | Admitting: Urology

## 2019-04-23 ENCOUNTER — Ambulatory Visit (INDEPENDENT_AMBULATORY_CARE_PROVIDER_SITE_OTHER): Payer: Medicaid Other | Admitting: Urology

## 2019-04-23 VITALS — BP 121/72 | HR 90 | Ht 72.0 in | Wt 234.0 lb

## 2019-04-23 DIAGNOSIS — N200 Calculus of kidney: Secondary | ICD-10-CM

## 2019-04-23 MED ORDER — TAMSULOSIN HCL 0.4 MG PO CAPS: 0.4000 mg | ORAL_CAPSULE | Freq: Every day | ORAL | 3 refills | Status: DC

## 2019-04-24 ENCOUNTER — Other Ambulatory Visit: Payer: Self-pay

## 2019-07-24 ENCOUNTER — Other Ambulatory Visit: Payer: Self-pay

## 2019-07-24 ENCOUNTER — Ambulatory Visit: Payer: Self-pay

## 2019-07-24 ENCOUNTER — Ambulatory Visit (LOCAL_COMMUNITY_HEALTH_CENTER): Payer: Medicaid Other

## 2019-07-24 DIAGNOSIS — Z23 Encounter for immunization: Secondary | ICD-10-CM

## 2019-07-24 NOTE — Progress Notes (Signed)
Pt to clinic requesting PnuemoVax vaccine. It has been at least 8 weeks since receiving Prevnar (08/28/2018). Pt has COPD and hx of 2 strokes.

## 2019-12-10 ENCOUNTER — Encounter: Payer: Self-pay | Admitting: Pain Medicine

## 2019-12-10 NOTE — Progress Notes (Signed)
Patient  Is returning for neck/shoulder pain 10/10. His pain radiates to the back of his head and to shoulders bilaterally. He last had a procedure with you on 08/07/18 for shoulder injection . That procedure worked well at 80% for a year. His pain started in back in Jan/2021. He states that he has a constant headache and can not do much. Patient would like to talk with you about future procedure and some medication that would help him until he gets procedure done.

## 2019-12-10 NOTE — Progress Notes (Signed)
Patient: Mark Vang  Service Category: E/M  Provider: Gaspar Cola, MD  DOB: 28-Mar-1961  DOS: 12/11/2019  Location: Office  MRN: [id number]  Setting: Ambulatory outpatient  Referring Provider: Remi Haggard, FNP  Type: Established Patient  Specialty: Interventional Pain Management  PCP: Remi Haggard, FNP  Location: Remote location  Delivery: TeleHealth     Virtual Encounter - Pain Management PROVIDER NOTE: Information contained herein reflects review and annotations entered in association with encounter. Interpretation of such information and data should be left to medically-trained personnel. Information provided to patient can be located elsewhere in the medical record under "Patient Instructions". Document created using STT-dictation technology, any transcriptional errors that may result from process are unintentional.    Contact & Pharmacy Preferred: [place]: [phone] (home) Mobile: [phone] (mobile) E-mail: No e-mail address on record  [place], [place] - [place] [address] Phone: [phone] Fax: [place], [place] - [place] [address] [place] [address] Phone: [phone] Fax: [phone]  [facility] [place] [address] [place] [address] Phone: [phone] Fax: [phone]  [id number] [place] [place]: [phone]: [address] [address]Phone: [phone]: [phone]  Pre-screening  Mr. CSchleicheroffered "in-person" vs "virtual" encounter. He indicated preferring virtual for this encounter.   Reason COVID-19*   Social distancing based on CDC and AMA recommendations.   I contacted MDoris Cheadleon 12/11/2019 via telephone.      I clearly identified myself as FGaspar Cola MD. I verified that I was speaking with the correct person using two identifiers (Name: MWelby Montminy and date of birth: 206-08-1961.  Consent I sought verbal advanced consent from MDoris Cheadlefor virtual visit interactions. I informed Mr. CDebarrof possible security and privacy concerns, risks, and limitations associated with providing "not-in-person" medical evaluation and management services. I also informed Mr. CSurianoof the availability of "in-person" appointments. Finally, I informed him that there would be a charge for the virtual visit and that he could be  personally, fully or partially, financially responsible for it. Mr. CPottexpressed understanding and agreed to proceed.   Historic Elements   Mr. MKendrew Paciis a 59y.o. year old, male patient evaluated today after his last contact with our practice on Visit date not found. Mr. CBurbach has a past medical history of COPD (chronic obstructive pulmonary disease) ([place], Hypertension, and Stroke ([place]. He also  has a past surgical history that includes Knee surgery and Nasal sinus surgery. Mr. CPapillionhas a current medication list which includes the following prescription(s): amlodipine, aspirin, atorvastatin, buspirone, cholecalciferol, cyclobenzaprine, fenofibrate, fluticasone, fluticasone-umeclidin-vilant, hydrochlorothiazide, lisinopril, meloxicam, multivitamin, nortriptyline, oxycodone, oxygen-helium, pantoprazole, potassium citrate, daliresp, daliresp, tamsulosin, stiolto respimat, baclofen, and gabapentin. He  reports that he has been smoking cigarettes. He has been smoking about 1.00 pack per day. He has never used smokeless tobacco. He reports that he does not drink alcohol or use drugs. Mr. CMckowenis allergic to amlodipine besy-benazepril hcl; amlodipine;  and hydrochlorothiazide.   HPI  Today, he is being contacted for new problems.  This patient was initially seen by u[place]on 04/02/2018.  He initially had  a left-sided cervical ESI with a right-sided cervical trigger point injection, which was later followed by 2 separate diagnostic bilateral cervical facet blocks, both of those with 100% relief of the pain for the duration of the local anesthetic.  The last time that we saw him was on 08/07/2018 when we did a diagnostic right-sided intra-articular shoulder joint injection.  He never came back for his follow-up appointment and he never called back until now.  According to the patient his worst pain is that of the neck on the left side with a daily headache that seems to be in the middle of the occipital region as well as the front and top of the head in appears to be the greater occipital nerve distribution.  In addition to this, the patient is having bilateral shoulder pain with the left being worse than the right.  He denies any type of upper extremity pain or numbness.  In the upper back region he is having some pain in the midline that is across the upper back with the left being worse than the right.  The patient indicates that after the treatments that we gave him last time in 2019, his headache completely went away and did not come back for an entire year.  He would like for [place] to go ahead and repeat those so that he again can get rid of some of this headache.  The patient volunteered that the reason why he stayed away was because his headaches got better but he also was scared of the sedation and this is why he did not want to proceed with radiofrequency ablation at that time.  Today the patient asked me for some opioid analgesics, but seem that he has been away for longer than a year, he is being seen today as if it was a new patient and therefore we will not be taking over his medication management on this first visit.  However, in view of the  fact that he did get such good relief of the pain like the last time that we treated him, we will go ahead and schedule him to come in for a left-sided cervical epidural steroid injection under fluoroscopic guidance and IV sedation.  If by any chance he continues to have the pain, then we will repeat the bilateral cervical facet blocks.  Pharmacotherapy Assessment  Analgesic: No opioid analgesics prescribed by our practice.   Monitoring: [place] PMP: PDMP reviewed during this encounter.       Pharmacotherapy: No side-effects or adverse reactions reported. Compliance: No problems identified. Effectiveness: Clinically acceptable. Plan: Refer to "POC".  UDS:  Summary  Date Value Ref Range Status  04/02/2018 FINAL  Final    Comment:    ==================================================================== TOXASSURE COMP DRUG ANALYSIS,UR ==================================================================== Test                             Result       Flag       Units Drug Present and Declared for Prescription Verification   Oxcarbazepine MHD              PRESENT      EXPECTED    Oxcarbazepine MHD is the active metabolite of oxcarbazepine and    eslicarbazepine.   Salicylate                     PRESENT      EXPECTED Drug Present not Declared for Prescription  Verification   Naproxen                       PRESENT      UNEXPECTED ==================================================================== Test                      Result    Flag   Units      Ref Range   Creatinine              275              mg/dL      >=20 ==================================================================== Declared Medications:  The flagging and interpretation on this report are based on the  following declared medications.  Unexpected results may arise from  inaccuracies in the declared medications.  **Note: The testing scope of this panel includes these medications:  Oxcarbazepine (Trileptal)  **Note: The testing  scope of this panel does not include small to  moderate amounts of these reported medications:  Aspirin  **Note: The testing scope of this panel does not include following  reported medications:  Amlodipine (Norvasc)  Atorvastatin (Lipitor)  Dexlansoprazole (Dexilant)  Fluticasone  Fluticasone (Flonase)  Lisinopril  Multivitamin  Oxygen  Potassium  Umeclidinium  Vitamin D ==================================================================== For clinical consultation, please call [phone]. ====================================================================    Laboratory Chemistry Profile   Renal Lab Results  Component Value Date   BUN 27 (H) 04/16/2018   CREATININE 1.12 04/16/2018   BCR 11 03/13/2018   GFRAA >60 04/16/2018   GFRNONAA >60 04/16/2018    Hepatic Lab Results  Component Value Date   AST 25 04/16/2018   ALT 29 04/16/2018   ALBUMIN 4.2 04/16/2018   ALKPHOS 99 04/16/2018   LIPASE 23 08/24/2017    Electrolytes Lab Results  Component Value Date   NA 137 04/16/2018   K 4.6 04/16/2018   CL 104 04/16/2018   CALCIUM 9.1 04/16/2018   MG 2.0 04/02/2018    Bone Lab Results  Component Value Date   VD25OH 35.6 04/02/2018    Inflammation (CRP: Acute Phase) (ESR: Chronic Phase) Lab Results  Component Value Date   CRP <0.8 04/02/2018   ESRSEDRATE 2 04/02/2018      Note: Above Lab results reviewed.  Imaging  Abdomen 1 view (KUB) CLINICAL DATA:  Bilateral nephrolithiasis.  EXAM: ABDOMEN - 1 VIEW  COMPARISON:  CT scan 04/06/2018  FINDINGS: The lung bases are grossly clear. The bowel gas pattern is unremarkable. No definite renal or ureteral calculi. Stable phlebolith a calcifications in the pelvis.  IMPRESSION: No definite renal or ureteral calculi.  Electronically Signed   By: Marijo Sanes M.D.   On: 04/22/2019 16:35  Assessment  The primary encounter diagnosis was Cervicalgia. Diagnoses of Cervicogenic headache, Cervico-occipital  neuralgia (Bilateral), Chronic neck pain (Primary Area of Pain) (Bilateral) (L>R), DDD (degenerative disc disease), cervical, Abnormal MRI, cervical spine (05/17/2018), and Chronic pain syndrome were also pertinent to this visit.  Plan of Care  Problem-specific:  No problem-specific Assessment & Plan notes found for this encounter.  Mr. Tomothy Eddins has a current medication list which includes the following long-term medication(s): amlodipine, atorvastatin, fenofibrate, fluticasone, hydrochlorothiazide, lisinopril, nortriptyline, daliresp, daliresp, baclofen, and gabapentin.  Pharmacotherapy (Medications Ordered): No orders of the defined types were placed in this encounter.  Orders:  Orders Placed This Encounter  Procedures  . Cervical Epidural Injection    Level(s): C7-T1 Laterality: Left-sided Purpose: Diagnostic Indication(s): Radiculitis and cervicalgia associater with cervical degenerative disc  disease.    Standing Status:   Future    Standing Expiration Date:   01/11/2020    Scheduling Instructions:     Procedure: Cervical Epidural Steroid Injection/Block     Sedation: With Sedation.     Timeframe: As soon as schedule allows    Order Specific Question:   Where will this procedure be performed?    Answer:   [facility]    Comments:   by Dr. Dossie Arbour   Follow-up plan:   Return for Procedure (w/ sedation): (L) CESI #2.      Interventional treatment options: Planned, scheduled, and/or pending:   Diagnostic left cervical ESI #2    Under consideration:   Diagnostic LESI Diagnostic bilateral lumbar facet block Possible bilateral lumbar facetRFA Possible bilateral cervical facetRFA Diagnostic bilateral IA hip injections Diagnosticgreater occipital NB Diagnostic bilateral thumb injections   Therapeutic/palliative (PRN):   Diagnostic/therapeutic left CESI #2 (0/60/60/70/>50) Diagnostic/therapeutic right cervical TPI/MNB #2  (<50//60/60/70/>50) Diagnostic/therapeutic bilateral cervical facet block #3 (100/100/100/50-75) Diagnostic/therapeutic right IA shoulder injection #2 (100) (did not keep follow-up)    Recent Visits No visits were found meeting these conditions.  Showing recent visits within past 90 days and meeting all other requirements   Today's Visits Date Type Provider Dept  12/11/19 Telemedicine Milinda Pointer, MD [facility]  Showing today's visits and meeting all other requirements   Future Appointments No visits were found meeting these conditions.  Showing future appointments within next 90 days and meeting all other requirements   I discussed the assessment and treatment plan with the patient. The patient was provided an opportunity to ask questions and all were answered. The patient agreed with the plan and demonstrated an understanding of the instructions.  Patient advised to call back or seek an in-person evaluation if the symptoms or condition worsens.  Duration of encounter: 16 minutes.  Note by: Gaspar Cola, MD Date: 12/11/2019; Time: 3:16 PM

## 2019-12-11 ENCOUNTER — Ambulatory Visit: Payer: Medicaid Other | Attending: Pain Medicine | Admitting: Pain Medicine

## 2019-12-11 ENCOUNTER — Other Ambulatory Visit: Payer: Self-pay

## 2019-12-11 VITALS — Ht 72.0 in | Wt 234.0 lb

## 2019-12-11 DIAGNOSIS — M542 Cervicalgia: Secondary | ICD-10-CM

## 2019-12-11 DIAGNOSIS — G8929 Other chronic pain: Secondary | ICD-10-CM

## 2019-12-11 DIAGNOSIS — M5030 Other cervical disc degeneration, unspecified cervical region: Secondary | ICD-10-CM | POA: Diagnosis not present

## 2019-12-11 DIAGNOSIS — R937 Abnormal findings on diagnostic imaging of other parts of musculoskeletal system: Secondary | ICD-10-CM

## 2019-12-11 DIAGNOSIS — R519 Headache, unspecified: Secondary | ICD-10-CM

## 2019-12-11 DIAGNOSIS — G4486 Cervicogenic headache: Secondary | ICD-10-CM

## 2019-12-11 DIAGNOSIS — M5481 Occipital neuralgia: Secondary | ICD-10-CM | POA: Diagnosis not present

## 2019-12-11 DIAGNOSIS — G894 Chronic pain syndrome: Secondary | ICD-10-CM

## 2019-12-11 NOTE — Patient Instructions (Signed)

## 2019-12-31 ENCOUNTER — Other Ambulatory Visit: Payer: Self-pay

## 2019-12-31 ENCOUNTER — Ambulatory Visit (HOSPITAL_BASED_OUTPATIENT_CLINIC_OR_DEPARTMENT_OTHER): Payer: Medicaid Other | Admitting: Pain Medicine

## 2019-12-31 ENCOUNTER — Ambulatory Visit
Admission: RE | Admit: 2019-12-31 | Discharge: 2019-12-31 | Disposition: A | Discharge status: Home / Self Care | Payer: Medicaid Other | Source: Ambulatory Visit | Attending: Pain Medicine | Admitting: Pain Medicine

## 2019-12-31 ENCOUNTER — Encounter: Payer: Self-pay | Admitting: Pain Medicine

## 2019-12-31 VITALS — BP 144/94 | HR 80 | Temp 99.0°F | Resp 16 | Ht 72.0 in | Wt 234.0 lb

## 2019-12-31 DIAGNOSIS — M7918 Myalgia, other site: Secondary | ICD-10-CM

## 2019-12-31 DIAGNOSIS — M542 Cervicalgia: Secondary | ICD-10-CM | POA: Diagnosis present

## 2019-12-31 DIAGNOSIS — R519 Headache, unspecified: Secondary | ICD-10-CM | POA: Insufficient documentation

## 2019-12-31 DIAGNOSIS — M5412 Radiculopathy, cervical region: Secondary | ICD-10-CM

## 2019-12-31 DIAGNOSIS — M792 Neuralgia and neuritis, unspecified: Secondary | ICD-10-CM

## 2019-12-31 DIAGNOSIS — G8929 Other chronic pain: Secondary | ICD-10-CM | POA: Insufficient documentation

## 2019-12-31 DIAGNOSIS — G4486 Cervicogenic headache: Secondary | ICD-10-CM

## 2019-12-31 DIAGNOSIS — M5481 Occipital neuralgia: Secondary | ICD-10-CM | POA: Diagnosis present

## 2019-12-31 DIAGNOSIS — M5030 Other cervical disc degeneration, unspecified cervical region: Secondary | ICD-10-CM

## 2019-12-31 DIAGNOSIS — M62838 Other muscle spasm: Secondary | ICD-10-CM

## 2019-12-31 MED ORDER — IOHEXOL 180 MG/ML  SOLN
10.0000 mL | Freq: Once | INTRAMUSCULAR | Status: AC
  Administered 2019-12-31: 10 mL via EPIDURAL
  Filled 2019-12-31: qty 20

## 2019-12-31 MED ORDER — ROPIVACAINE HCL 2 MG/ML IJ SOLN
1.0000 mL | Freq: Once | INTRAMUSCULAR | Status: AC
  Administered 2019-12-31: 1 mL via EPIDURAL
  Filled 2019-12-31: qty 10

## 2019-12-31 MED ORDER — LIDOCAINE HCL 2 % IJ SOLN
20.0000 mL | Freq: Once | INTRAMUSCULAR | Status: AC
  Administered 2019-12-31: 400 mg
  Filled 2019-12-31: qty 40

## 2019-12-31 MED ORDER — SODIUM CHLORIDE (PF) 0.9 % IJ SOLN
INTRAMUSCULAR | Status: AC
  Filled 2019-12-31: qty 10

## 2019-12-31 MED ORDER — FENTANYL CITRATE (PF) 100 MCG/2ML IJ SOLN: 25.0000 ug | INTRAMUSCULAR | Status: DC | PRN

## 2019-12-31 MED ORDER — DEXAMETHASONE SODIUM PHOSPHATE 10 MG/ML IJ SOLN
10.0000 mg | Freq: Once | INTRAMUSCULAR | Status: AC
  Administered 2019-12-31: 10 mg
  Filled 2019-12-31: qty 1

## 2019-12-31 MED ORDER — MIDAZOLAM HCL 5 MG/5ML IJ SOLN: 1.0000 mg | INTRAMUSCULAR | Status: DC | PRN

## 2019-12-31 MED ORDER — LACTATED RINGERS IV SOLN: 1000.0000 mL | Freq: Once | INTRAVENOUS | Status: DC

## 2019-12-31 MED ORDER — SODIUM CHLORIDE 0.9% FLUSH
1.0000 mL | Freq: Once | INTRAVENOUS | Status: AC
  Administered 2019-12-31: 1 mL

## 2019-12-31 NOTE — Patient Instructions (Signed)

## 2019-12-31 NOTE — Progress Notes (Signed)
PROVIDER NOTE: Information contained herein reflects review and annotations entered in association with encounter. Interpretation of such information and data should be left to medically-trained personnel. Information provided to patient can be located elsewhere in the medical record under "Patient Instructions". Document created using STT-dictation technology, any transcriptional errors that may result from process are unintentional.    Patient: Mark Vang  Service Category: Procedure  Provider: Gaspar Cola, MD  DOB: 07-18-1961  DOS: 12/31/2019  Location: [facility]  MRN: [id number]  Setting: Ambulatory - outpatient  Referring Provider: Remi Haggard, FNP  Type: Established Patient  Specialty: Interventional Pain Management  PCP: Remi Haggard, FNP   Primary Reason for Visit: Interventional Pain Management Treatment. CC: Headache and Neck Pain  Procedure:          Anesthesia, Analgesia, Anxiolysis:  Type: Diagnostic, Inter-Laminar, Cervical Epidural Steroid Injection  #2  Region: Posterior Cervico-thoracic Region Level: C7-T1 Laterality: Left-Sided Paramedial  Type: Moderate (Conscious) Sedation combined with Local Anesthesia Indication(s): Analgesia and Anxiety Route: Intravenous (IV) IV Access: Secured Sedation: Meaningful verbal contact was maintained at all times during the procedure  Local Anesthetic: Lidocaine 1-2%  Position: Prone with head of the table was raised to facilitate breathing.   Indications: 1. Cervicalgia   2. Cervical radiculopathy   3. Cervicogenic headache   4. DDD (degenerative disc disease), cervical   5. Cervico-occipital neuralgia (Bilateral)    Pain Score: Pre-procedure: 9 /10 Post-procedure: 6 /10   Pre-op Assessment:  Mr. Queenan is a 59 y.o. (year old), male patient, seen today for interventional treatment. He  has a past surgical history that includes Knee surgery and Nasal sinus surgery. Mr. Sather has a current  medication list which includes the following prescription(s): amlodipine, aspirin, atorvastatin, baclofen, buspirone, cholecalciferol, cyclobenzaprine, fenofibrate, fluticasone, fluticasone-umeclidin-vilant, hydrochlorothiazide, lisinopril, meloxicam, multivitamin, nortriptyline, oxycodone, oxygen-helium, pantoprazole, potassium citrate, daliresp, daliresp, tamsulosin, stiolto respimat, and gabapentin, and the following Facility-Administered Medications: fentanyl, lactated ringers, and midazolam. His primarily concern today is the Headache and Neck Pain  Initial Vital Signs:  Pulse/HCG Rate: 80ECG Heart Rate: 84 Temp: 99 F (37.2 C) Resp: 18 BP: 119/78 SpO2: 99 %  BMI: Estimated body mass index is 31.74 kg/m as calculated from the following:   Height as of this encounter: 6' (1.829 m).   Weight as of this encounter: 234 lb (106.1 kg).  Risk Assessment: Allergies: Reviewed. He is allergic to amlodipine besy-benazepril hcl; amlodipine; and hydrochlorothiazide.  Allergy Precautions: None required Coagulopathies: Reviewed. None identified.  Blood-thinner therapy: None at this time Active Infection(s): Reviewed. None identified. Mr. Zajicek is afebrile  Site Confirmation: Mr. Dittus was asked to confirm the procedure and laterality before marking the site Procedure checklist: Completed Consent: Before the procedure and under the influence of no sedative(s), amnesic(s), or anxiolytics, the patient was informed of the treatment options, risks and possible complications. To fulfill our ethical and legal obligations, as recommended by the American Medical Association's Code of Ethics, I have informed the patient of my clinical impression; the nature and purpose of the treatment or procedure; the risks, benefits, and possible complications of the intervention; the alternatives, including doing nothing; the risk(s) and benefit(s) of the alternative treatment(s) or procedure(s); and the risk(s) and  benefit(s) of doing nothing. The patient was provided information about the general risks and possible complications associated with the procedure. These may include, but are not limited to: failure to achieve desired goals, infection, bleeding, organ or nerve damage, allergic reactions, paralysis, and death. In  addition, the patient was informed of those risks and complications associated to Spine-related procedures, such as failure to decrease pain; infection (i.e.: Meningitis, epidural or intraspinal abscess); bleeding (i.e.: epidural hematoma, subarachnoid hemorrhage, or any other type of intraspinal or peri-dural bleeding); organ or nerve damage (i.e.: Any type of peripheral nerve, nerve root, or spinal cord injury) with subsequent damage to sensory, motor, and/or autonomic systems, resulting in permanent pain, numbness, and/or weakness of one or several areas of the body; allergic reactions; (i.e.: anaphylactic reaction); and/or death. Furthermore, the patient was informed of those risks and complications associated with the medications. These include, but are not limited to: allergic reactions (i.e.: anaphylactic or anaphylactoid reaction(s)); adrenal axis suppression; blood sugar elevation that in diabetics may result in ketoacidosis or comma; water retention that in patients with history of congestive heart failure may result in shortness of breath, pulmonary edema, and decompensation with resultant heart failure; weight gain; swelling or edema; medication-induced neural toxicity; particulate matter embolism and blood vessel occlusion with resultant organ, and/or nervous system infarction; and/or aseptic necrosis of one or more joints. Finally, the patient was informed that Medicine is not an exact science; therefore, there is also the possibility of unforeseen or unpredictable risks and/or possible complications that may result in a catastrophic outcome. The patient indicated having understood very  clearly. We have given the patient no guarantees and we have made no promises. Enough time was given to the patient to ask questions, all of which were answered to the patient's satisfaction. Mr. Debaker has indicated that he wanted to continue with the procedure. Attestation: I, the ordering provider, attest that I have discussed with the patient the benefits, risks, side-effects, alternatives, likelihood of achieving goals, and potential problems during recovery for the procedure that I have provided informed consent. Date  Time: 12/31/2019  9:26 AM  Pre-Procedure Preparation:  Monitoring: As per clinic protocol. Respiration, ETCO2, SpO2, BP, heart rate and rhythm monitor placed and checked for adequate function Safety Precautions: Patient was assessed for positional comfort and pressure points before starting the procedure. Time-out: I initiated and conducted the "Time-out" before starting the procedure, as per protocol. The patient was asked to participate by confirming the accuracy of the "Time Out" information. Verification of the correct person, site, and procedure were performed and confirmed by me, the nursing staff, and the patient. "Time-out" conducted as per Joint Commission's Universal Protocol (UP.01.01.01). Time: 1024  Description of Procedure:          Target Area: For Epidural Steroid injections the target is the interlaminar space, initially targeting the lower border of the superior vertebral body lamina. Approach: Paramedial approach. Area Prepped: Entire PosteriorCervical Region DuraPrep (Iodine Povacrylex [0.7% available iodine] and Isopropyl Alcohol, 74% w/w) Safety Precautions: Aspiration looking for blood return was conducted prior to all injections. At no point did we inject any substances, as a needle was being advanced. No attempts were made at seeking any paresthesias. Safe injection practices and needle disposal techniques used. Medications properly checked for expiration  dates. SDV (single dose vial) medications used. Description of the Procedure: Protocol guidelines were followed. The procedure needle was introduced through the skin, ipsilateral to the reported pain, and advanced to the target area. Bone was contacted and the needle walked caudad, until the lamina was cleared. The epidural space was identified using "loss-of-resistance technique" with 2-3 ml of PF-NaCl (0.9% NSS), in a 5cc LOR glass syringe. Vitals:   12/31/19 1019 12/31/19 1023 12/31/19 1028 12/31/19 1034  BP: 125/81  128/86 (!) 122/91 (!) 144/94  Pulse:      Resp: (!) 24 (!) 24 (!) 23 16  Temp:      SpO2: 95% 96% 96% 96%  Weight:      Height:        Start Time: 1024 hrs. End Time: 1031 hrs. Materials:  Needle(s) Type: Epidural needle Gauge: 17G Length: 3.5-in Medication(s): Please see orders for medications and dosing details.  Imaging Guidance (Spinal):          Type of Imaging Technique: Fluoroscopy Guidance (Spinal) Indication(s): Assistance in needle guidance and placement for procedures requiring needle placement in or near specific anatomical locations not easily accessible without such assistance. Exposure Time: Please see nurses notes. Contrast: Before injecting any contrast, we confirmed that the patient did not have an allergy to iodine, shellfish, or radiological contrast. Once satisfactory needle placement was completed at the desired level, radiological contrast was injected. Contrast injected under live fluoroscopy. No contrast complications. See chart for type and volume of contrast used. Fluoroscopic Guidance: I was personally present during the use of fluoroscopy. "Tunnel Vision Technique" used to obtain the best possible view of the target area. Parallax error corrected before commencing the procedure. "Direction-depth-direction" technique used to introduce the needle under continuous pulsed fluoroscopy. Once target was reached, antero-posterior, oblique, and lateral  fluoroscopic projection used confirm needle placement in all planes. Images permanently stored in EMR. Interpretation: I personally interpreted the imaging intraoperatively. Adequate needle placement confirmed in multiple planes. Appropriate spread of contrast into desired area was observed. No evidence of afferent or efferent intravascular uptake. No intrathecal or subarachnoid spread observed. Permanent images saved into the patient's record.  Antibiotic Prophylaxis:   Anti-infectives (From admission, onward)   None     Indication(s): None identified  Post-operative Assessment:  Post-procedure Vital Signs:  Pulse/HCG Rate: 8078 Temp: 99 F (37.2 C) Resp: 16 BP: (!) 144/94 SpO2: 96 %  EBL: None  Complications: No immediate post-treatment complications observed by team, or reported by patient.  Note: The patient tolerated the entire procedure well. A repeat set of vitals were taken after the procedure and the patient was kept under observation following institutional policy, for this type of procedure. Post-procedural neurological assessment was performed, showing return to baseline, prior to discharge. The patient was provided with post-procedure discharge instructions, including a section on how to identify potential problems. Should any problems arise concerning this procedure, the patient was given instructions to immediately contact us, at any time, without hesitation. In any case, we plan to contact the patient by telephone for a follow-up status report regarding this interventional procedure.  Comments:  No additional relevant information.  Plan of Care  Orders:  Orders Placed This Encounter  Procedures  . Cervical Epidural Injection    Procedure: Cervical Epidural Steroid Injection/Block Purpose: Therapeutic Indication(s): Radiculitis and cervicalgia associater with cervical degenerative disc disease.    Scheduling Instructions:     Level(s): C7-T1     Laterality:  Left-sided     Sedation: Patient's choice.     Timeframe: Today    Order Specific Question:   Where will this procedure be performed?    Answer:   [facility]    Comments:   by Dr. Dossie Arbour  . [facility] C-ARM 1-60 MIN NO REPORT    Intraoperative interpretation by procedural physician at [place].    Standing Status:   Standing    Number of Occurrences:   1    Order  Specific Question:   Reason for exam:    Answer:   Assistance in needle guidance and placement for procedures requiring needle placement in or near specific anatomical locations not easily accessible without such assistance.  . Informed Consent Details: Physician/Practitioner Attestation; Transcribe to consent form and obtain patient signature    Nursing Order: Transcribe to consent form and obtain patient signature. Note: Always confirm laterality of pain with Mr. Selders, before procedure.  Procedure: Cervical Epidural Steroid Injection (CESI) under fluoroscopic guidance  Indication/Reason: Cervicalgia (Neck Pain) with or without Cervical Radiculopathy/Radiculitis (Arm/Shoulder Pain, Numbness, and/or weakness), secondary to Cervical and/or Cervicothoracic Degenerative Disc Disease (DDD), with or without Intervertebral Disc Displacement (IVDD).  Provider Attestation: I, [place] Dossie Arbour, MD, (Pain Management Specialist), the physician/practitioner, attest that I have discussed with the patient the benefits, risks, side effects, alternatives, likelihood of achieving goals and potential problems during recovery for the procedure that I have provided informed consent.  . Provide equipment / supplies at bedside    Equipment required: Single use, disposable, "Epidural Tray" Epidural Catheter: NOT required    Standing Status:   Standing    Number of Occurrences:   1    Order Specific Question:   Specify    Answer:   Epidural Tray   Chronic Opioid Analgesic:  No opioid analgesics prescribed by our  practice.   Medications ordered for procedure: Meds ordered this encounter  Medications  . iohexol (OMNIPAQUE) 180 MG/ML injection 10 mL    Must be Myelogram-compatible. If not available, you may substitute with a water-soluble, non-ionic, hypoallergenic, myelogram-compatible radiological contrast medium.  Marland Kitchen lidocaine (XYLOCAINE) 2 % (with pres) injection 400 mg  . lactated ringers infusion 1,000 mL  . midazolam (VERSED) 5 MG/5ML injection 1-2 mg    Make sure Flumazenil is available in the pyxis when using this medication. If oversedation occurs, administer 0.2 mg IV over 15 sec. If after 45 sec no response, administer 0.2 mg again over 1 min; may repeat at 1 min intervals; not to exceed 4 doses (1 mg)  . fentaNYL (SUBLIMAZE) injection 25-50 mcg    Make sure Narcan is available in the pyxis when using this medication. In the event of respiratory depression (RR< 8/min): Titrate NARCAN (naloxone) in increments of 0.1 to 0.2 mg IV at 2-3 minute intervals, until desired degree of reversal.  . sodium chloride flush (NS) 0.9 % injection 1 mL  . ropivacaine (PF) 2 mg/mL (0.2%) (NAROPIN) injection 1 mL  . dexamethasone (DECADRON) injection 10 mg   Medications administered: We administered iohexol, lidocaine, sodium chloride flush, ropivacaine (PF) 2 mg/mL (0.2%), and dexamethasone.  See the medical record for exact dosing, route, and time of administration.  Follow-up plan:   Return in about 2 weeks (around 01/14/2020) for (VV), (PP).       Interventional treatment options: Planned, scheduled, and/or pending:   Diagnostic left cervical ESI #2    Under consideration:   Diagnostic LESI Diagnostic bilateral lumbar facet block Possible bilateral lumbar facetRFA Possible bilateral cervical facetRFA Diagnostic bilateral IA hip injections Diagnosticgreater occipital NB Diagnostic bilateral thumb injections   Therapeutic/palliative (PRN):   Diagnostic/therapeutic left CESI #2  (0/60/60/70/>50) Diagnostic/therapeutic right cervical TPI/MNB #2 (<50//60/60/70/>50) Diagnostic/therapeutic bilateral cervical facet block #3 (100/100/100/50-75) Diagnostic/therapeutic right IA shoulder injection #2 (100) (did not keep follow-up)     Recent Visits Date Type Provider Dept  12/11/19 Telemedicine Milinda Pointer, MD [facility]  Showing recent visits within past 90 days and meeting all other requirements  Today's Visits Date Type Provider Dept  12/31/19 Procedure visit Milinda Pointer, MD [facility]  Showing today's visits and meeting all other requirements   Future Appointments Date Type Provider Dept  01/22/20 Appointment Milinda Pointer, MD [facility]  Showing future appointments within next 90 days and meeting all other requirements   Disposition: Discharge home  Discharge (Date  Time): 12/31/2019; 1042 hrs.   Primary Care Physician: Remi Haggard, FNP Location: [facility] Note by: Gaspar Cola, MD Date: 12/31/2019; Time: 10:46 AM  Disclaimer:  Medicine is not an Chief Strategy Officer. The only guarantee in medicine is that nothing is guaranteed. It is important to note that the decision to proceed with this intervention was based on the information collected from the patient. The Data and conclusions were drawn from the patient's questionnaire, the interview, and the physical examination. Because the information was provided in large part by the patient, it cannot be guaranteed that it has not been purposely or unconsciously manipulated. Every effort has been made to obtain as much relevant data as possible for this evaluation. It is important to note that the conclusions that lead to this procedure are derived in large part from the available data. Always take into account that the treatment will also be dependent on availability of resources and existing treatment guidelines, considered by other  Pain Management Practitioners as being common knowledge and practice, at the time of the intervention. For Medico-Legal purposes, it is also important to point out that variation in procedural techniques and pharmacological choices are the acceptable norm. The indications, contraindications, technique, and results of the above procedure should only be interpreted and judged by a Board-Certified Interventional Pain Specialist with extensive familiarity and expertise in the same exact procedure and technique.

## 2019-12-31 NOTE — Progress Notes (Signed)
Safety precautions to be maintained throughout the outpatient stay will include: orient to surroundings, keep bed in low position, maintain call bell within reach at all times, provide assistance with transfer out of bed and ambulation.  

## 2020-01-01 ENCOUNTER — Telehealth: Payer: Self-pay | Admitting: *Deleted

## 2020-01-01 NOTE — Telephone Encounter (Signed)
Attempted to call for post procedure follow-up. Message left. 

## 2020-01-13 ENCOUNTER — Encounter: Payer: Self-pay | Admitting: Family Medicine

## 2020-01-21 NOTE — Progress Notes (Signed)
Unsuccessful attempt to contact patient for Virtual Visit (Pain Management Telehealth)   Patient provided contact information:  [phone] (home); [phone] (mobile); (Preferred) [phone] No e-mail address on record   Pre-screening:  Our staff was unsuccessful in contacting Mr. Cubero using the above provided information.   I unsuccessfully attempted to make contact with [name] on 01/22/2020 via telephone. I was unable to complete the virtual encounter due to call going directly to voicemail. I was able to leave a message where I clearly identify myself as Gaspar Cola, MD and I left a message to call us back to reschedule the call.  Pharmacotherapy Assessment  Analgesic: No opioid analgesics prescribed by our practice.   Post-Procedure Evaluation  Procedure: Diagnostic left-sided cervical epidural steroid injection #2 under fluoroscopic guidance and IV sedation Pre-procedure pain level: 9/10 Post-procedure: 6/10 (< 50% relief)  Sedation: Sedation provided.  Effectiveness during initial hour after procedure(Ultra-Short Term Relief): 0 %.  Local anesthetic used: Long-acting (4-6 hours) Effectiveness: Defined as any analgesic benefit obtained secondary to the administration of local anesthetics. This carries significant diagnostic value as to the etiological location, or anatomical origin, of the pain. Duration of benefit is expected to coincide with the duration of the local anesthetic used.  Effectiveness during initial 4-6 hours after procedure(Short-Term Relief): 0 %.  Long-term benefit: Defined as any relief past the pharmacologic duration of the local anesthetics.  Effectiveness past the initial 6 hours after procedure(Long-Term Relief): 100 %.   Follow-up plan:   Reschedule Visit.     Interventional treatment options: Planned, scheduled, and/or pending:   Diagnostic left cervical ESI #2    Under consideration:   Diagnostic LESI Diagnostic bilateral  lumbar facet block Possible bilateral lumbar facetRFA Possible bilateral cervical facetRFA Diagnostic bilateral IA hip injections Diagnosticgreater occipital NB Diagnostic bilateral thumb injections   Therapeutic/palliative (PRN):   Diagnostic/therapeutic left CESI #2 (0/60/60/70/>50) Diagnostic/therapeutic right cervical TPI/MNB #2 (<50//60/60/70/>50) Diagnostic/therapeutic bilateral cervical facet block #3 (100/100/100/50-75) Diagnostic/therapeutic right IA shoulder injection #2 (100) (did not keep follow-up)    Recent Visits Date Type Provider Dept  12/31/19 Procedure visit Milinda Pointer, MD [facility]  12/11/19 Telemedicine Milinda Pointer, MD [facility]  Showing recent visits within past 90 days and meeting all other requirements   Today's Visits Date Type Provider Dept  01/22/20 Telemedicine Milinda Pointer, MD [facility]  Showing today's visits and meeting all other requirements   Future Appointments No visits were found meeting these conditions.  Showing future appointments within next 90 days and meeting all other requirements    Note by: Gaspar Cola, MD Date: 01/22/2020; Time: 1:19 PM

## 2020-01-22 ENCOUNTER — Other Ambulatory Visit: Payer: Self-pay

## 2020-01-22 ENCOUNTER — Ambulatory Visit: Payer: Medicaid Other | Attending: Pain Medicine | Admitting: Pain Medicine

## 2020-01-22 DIAGNOSIS — M792 Neuralgia and neuritis, unspecified: Secondary | ICD-10-CM

## 2020-01-22 DIAGNOSIS — M542 Cervicalgia: Secondary | ICD-10-CM

## 2020-01-22 DIAGNOSIS — Z79899 Other long term (current) drug therapy: Secondary | ICD-10-CM

## 2020-01-22 DIAGNOSIS — M25551 Pain in right hip: Secondary | ICD-10-CM

## 2020-01-22 DIAGNOSIS — M7918 Myalgia, other site: Secondary | ICD-10-CM

## 2020-01-22 DIAGNOSIS — M79605 Pain in left leg: Secondary | ICD-10-CM

## 2020-01-22 DIAGNOSIS — G8929 Other chronic pain: Secondary | ICD-10-CM

## 2020-01-22 DIAGNOSIS — M62838 Other muscle spasm: Secondary | ICD-10-CM

## 2020-01-22 DIAGNOSIS — G894 Chronic pain syndrome: Secondary | ICD-10-CM

## 2020-01-22 DIAGNOSIS — M545 Low back pain: Secondary | ICD-10-CM

## 2020-01-22 DIAGNOSIS — M79604 Pain in right leg: Secondary | ICD-10-CM

## 2020-02-06 ENCOUNTER — Encounter: Payer: Self-pay | Admitting: Pain Medicine

## 2020-02-09 NOTE — Progress Notes (Signed)
Patient: Mark Vang  Service Category: E/M  Provider: Gaspar Cola, MD  DOB: 03/07/1961  DOS: 02/10/2020  Location: Office  MRN: [id number]  Setting: Ambulatory outpatient  Referring Provider: Remi Haggard, FNP  Type: Established Patient  Specialty: Interventional Pain Management  PCP: Remi Haggard, FNP  Location: Remote location  Delivery: TeleHealth     Virtual Encounter - Pain Management PROVIDER NOTE: Information contained herein reflects review and annotations entered in association with encounter. Interpretation of such information and data should be left to medically-trained personnel. Information provided to patient can be located elsewhere in the medical record under "Patient Instructions". Document created using STT-dictation technology, any transcriptional errors that may result from process are unintentional.    Contact & Pharmacy Preferred: [phone] Home: [phone] (home) Mobile: [phone] (mobile) E-mail: No e-mail address on record  [facility] [place] [address] [place] [address] Phone: [phone] Fax: [phone]   Pre-screening  Mark Vang offered "in-person" vs "virtual" encounter. He indicated preferring virtual for this encounter.   Reason COVID-19*  Social distancing based on CDC and AMA recommendations.   I contacted Mark Vang on 02/10/2020 via telephone.      I clearly identified myself as Gaspar Cola, MD. I verified that I was speaking with the correct person using two identifiers (Name: Mark Vang, and date of birth: 1960-12-07).  Consent I sought verbal advanced consent from Mark Vang for virtual visit interactions. I informed Mark Vang of possible security and privacy concerns, risks, and limitations associated with providing "not-in-person" medical evaluation and management services. I also informed Mark Vang of the availability of  "in-person" appointments. Finally, I informed him that there would be a charge for the virtual visit and that he could be  personally, fully or partially, financially responsible for it. Mark Vang expressed understanding and agreed to proceed.   Historic Elements   Mark Vang is a 58 y.o. year old, male patient evaluated today after his last contact with our practice on 01/01/2020. Mark Vang  has a past medical history of COPD (chronic obstructive pulmonary disease) ([place]), Hypertension, and Stroke ([place]). He also  has a past surgical history that includes Knee surgery and Nasal sinus surgery. Mark Vang has a current medication list which includes the following prescription(s): amlodipine, aspirin, atorvastatin, buspirone, cholecalciferol, cyclobenzaprine, fenofibrate, fluticasone, fluticasone-umeclidin-vilant, emgality, hydrochlorothiazide, lisinopril, meloxicam, multivitamin, nortriptyline, oxycodone, oxygen-helium, pantoprazole, potassium citrate, daliresp, daliresp, tamsulosin, stiolto respimat, baclofen, and gabapentin. He  reports that he has been smoking cigarettes. He has been smoking about 1.00 pack per day. He has never used smokeless tobacco. He reports that he does not drink alcohol or use drugs. Mark Vang is allergic to amlodipine besy-benazepril hcl; amlodipine; and hydrochlorothiazide.   HPI  Today, he is being contacted for a post-procedure assessment.  The patient refers that this left cervical epidural steroid injection provided him with excellent relief of the pain in his neck pain is completely gone.  This seems to have worked better than the cervical facet block.  However, he also points out that headache in the back of the head has continued to give him problems despite epidurals.  He refers having been to the neurologist, who recently prescribed something for him that took care of the headache.  In view of this, we will let the neurologist continue managing the headache and  will concentrate on the neck problems.  He refers that the pain in the right  side of the neck has began to return.  At the very beginning we did a trigger point of the right cervical paravertebral muscles with excellent relief of the pain that lasted until now.  He would like to have that repeated.  In addition, we also did a right intra-articular shoulder injection which did provide him with really good relief of the pain and he would like to also have that arm repeated.  I will have any particular diagnostic x-rays of the right shoulder and therefore we will consider doing that in the near future should discontinue.  Post-Procedure Evaluation  Procedure: Diagnostic/therapeutic left cervical ESI #2 under fluoroscopic guidance and IV sedation Pre-procedure pain level: 9/10 Post-procedure: 6/10 (< 50% relief)  Sedation: Sedation provided.  Effectiveness during initial hour after procedure(Ultra-Short Term Relief): 100 %   Local anesthetic used: Long-acting (4-6 hours) Effectiveness: Defined as any analgesic benefit obtained secondary to the administration of local anesthetics. This carries significant diagnostic value as to the etiological location, or anatomical origin, of the pain. Duration of benefit is expected to coincide with the duration of the local anesthetic used.  Effectiveness during initial 4-6 hours after procedure(Short-Term Relief): 100 %  Long-term benefit: Defined as any relief past the pharmacologic duration of the local anesthetics.  Effectiveness past the initial 6 hours after procedure(Long-Term Relief): 100 %  Current benefits: Defined as benefit that persist at this time.   Analgesia:  90-100% better Function: Mark Vang reports improvement in function ROM: Mark Vang reports improvement in ROM  Pharmacotherapy Assessment  Analgesic: No opioid analgesics prescribed by our practice.   Monitoring: [place] PMP: PDMP reviewed during this encounter.       Pharmacotherapy: No  side-effects or adverse reactions reported. Compliance: No problems identified. Effectiveness: Clinically acceptable. Plan: Refer to "POC".  UDS:  Summary  Date Value Ref Range Status  04/02/2018 FINAL  Final    Comment:    ==================================================================== TOX[facility]RE COMP DRUG ANALYSIS,UR ==================================================================== Test                             Result       Flag       Units Drug Present and Declared for Prescription Verification   Oxcarbazepine MHD              PRESENT      EXPECTED    Oxcarbazepine MHD is the active metabolite of oxcarbazepine and    eslicarbazepine.   Salicylate                     PRESENT      EXPECTED Drug Present not Declared for Prescription Verification   Naproxen                       PRESENT      UNEXPECTED ==================================================================== Test                      Result    Flag   Units      Ref Range   Creatinine              275              mg/dL      >=20 ==================================================================== Declared Medications:  The flagging and interpretation on this report are based on the  following declared medications.  Unexpected results may arise from  inaccuracies in the  declared medications.  **Note: The testing scope of this panel includes these medications:  Oxcarbazepine (Trileptal)  **Note: The testing scope of this panel does not include small to  moderate amounts of these reported medications:  Aspirin  **Note: The testing scope of this panel does not include following  reported medications:  Amlodipine (Norvasc)  Atorvastatin (Lipitor)  Dexlansoprazole (Dexilant)  Fluticasone  Fluticasone (Flonase)  Lisinopril  Multivitamin  Oxygen  Potassium  Umeclidinium  Vitamin D ==================================================================== For clinical consultation, please call [phone]. ====================================================================    Laboratory Chemistry Profile   Renal Lab Results  Component Value Date   BUN 27 (H) 04/16/2018   CREATININE 1.12 04/16/2018   BCR 11 03/13/2018   GFRAA >60 04/16/2018   GFRNONAA >60 04/16/2018     Hepatic Lab Results  Component Value Date   AST 25 04/16/2018   ALT 29 04/16/2018   ALBUMIN 4.2 04/16/2018   ALKPHOS 99 04/16/2018   LIPASE 23 08/24/2017     Electrolytes Lab Results  Component Value Date   NA 137 04/16/2018   K 4.6 04/16/2018   CL 104 04/16/2018   CALCIUM 9.1 04/16/2018   MG 2.0 04/02/2018     Bone Lab Results  Component Value Date   VD25OH 35.6 04/02/2018     Inflammation (CRP: Acute Phase) (ESR: Chronic Phase) Lab Results  Component Value Date   CRP <0.8 04/02/2018   ESRSEDRATE 2 04/02/2018       Note: Above Lab results reviewed.  Imaging  DG PAIN CLINIC C-ARM 1-60 MIN NO REPORT Fluoro was used, but no Radiologist interpretation will be provided.  Please refer to "NOTES" tab for provider progress note.  Assessment  The primary encounter diagnosis was Chronic pain syndrome. Diagnoses of Chronic neck pain (Primary Area of Pain) (Bilateral) (L>R), Chronic low back pain (Secondary Area of Pain) (Bilateral) (R>L), Chronic hip pain (Tertiary Area of Pain) (Right), Chronic lower extremity pain (Tertiary Area of Pain) (Bilateral) (R>L), Trigger point of neck, Chronic shoulder pain (Right), and Osteoarthritis of shoulder (Right) were also pertinent to this visit.  Plan of Care  Problem-specific:  No problem-specific Assessment & Plan notes found for this encounter.  Mark Vang has a current medication list which includes the following long-term medication(s): amlodipine, atorvastatin, fenofibrate, fluticasone, hydrochlorothiazide, lisinopril, nortriptyline, daliresp, daliresp, baclofen, and gabapentin.  Pharmacotherapy (Medications Ordered): No orders of the  defined types were placed in this encounter.  Orders:  Orders Placed This Encounter  Procedures  . Cervical Epidural Injection    Procedure: Cervical Epidural Steroid Injection/Block Purpose: Therapeutic Indication(s): Radiculitis and/or cervicalgia associater with cervical degenerative disc disease.    Standing Status:   Standing    Number of Occurrences:   6    Standing Expiration Date:   08/12/2021    Scheduling Instructions:     Level(s): C7-T1     Laterality: Left-sided     Sedation: Patient's choice.     Timeframe: PRN    Order Specific Question:   Where will this procedure be performed?    Answer:   [facility]    Comments:   by Dr. Dossie Arbour  . SHOULDER INJECTION    Standing Status:   Future    Standing Expiration Date:   03/11/2020    Scheduling Instructions:     Side: Right-sided     Sedation: No Sedation.     Timeframe: As soon as schedule allows    Order Specific Question:   Where will this procedure  be performed?    Answer:   [facility]    Comments:   by Dr. Dossie Arbour  . TRIGGER POINT INJECTION    Area: Right neck region Indications: Neck muscle trigger point.  Myofascial pain syndrome affecting the right paravertebral cervical muscle group. CPT code: 20552    Standing Status:   Future    Standing Expiration Date:   02/09/2021    Scheduling Instructions:     Type: Myoneural block (TPI) of right cervical paravertebral muscles.     Side: Right     Sedation: None     Timeframe: ASAA    Order Specific Question:   Where will this procedure be performed?    Answer:   [facility]  . DG Shoulder Right    Standing Status:   Future    Standing Expiration Date:   04/11/2021    Order Specific Question:   Reason for Exam (SYMPTOM  OR DIAGNOSIS REQUIRED)    Answer:   Right shoulder pain    Order Specific Question:   Preferred imaging location?    Answer:   [facility]    Order Specific Question:   Call Results- Best Contact Number?     Answer:   [phone]   Follow-up plan:   Return for Procedure (no sedation): (R) Cervical TPI/MNB #2 + (R) IA Shoulder inj #2.      Interventional treatment options: Planned, scheduled, and/or pending:   Therapeutic right paravertebral cervical TPI/MNB #2 + right IA shoulder injection #2    Under consideration:   Diagnostic LESI Diagnostic bilateral lumbar facet block Possible bilateral lumbar facetRFA Possible bilateral cervical facetRFA Diagnostic bilateral IA hip injections Diagnosticgreater occipital NB Diagnostic bilateral thumb injections   Therapeutic/palliative (PRN):   Diagnostic/therapeutic left CESI #3 (100/100/100) Diagnostic/therapeutic right cervical TPI/MNB #2 (<50//60/60/70/>50) Diagnostic/therapeutic bilateral cervical facet block #3 (100/100/100/50-75) Diagnostic/therapeutic right IA shoulder injection #2 (100) (did not keep follow-up)    Recent Visits Date Type Provider Dept  01/22/20 Telemedicine Milinda Pointer, MD [facility]  12/31/19 Procedure visit Milinda Pointer, MD [facility]  12/11/19 Telemedicine Milinda Pointer, MD [facility]  Showing recent visits within past 90 days and meeting all other requirements   Today's Visits Date Type Provider Dept  02/10/20 Telemedicine Milinda Pointer, MD [facility]  Showing today's visits and meeting all other requirements   Future Appointments No visits were found meeting these conditions.  Showing future appointments within next 90 days and meeting all other requirements   I discussed the assessment and treatment plan with the patient. The patient was provided an opportunity to ask questions and all were answered. The patient agreed with the plan and demonstrated an understanding of the instructions.  Patient advised to call back or seek an in-person evaluation if the symptoms or condition worsens.  Duration of encounter: 13 minutes.  Note  by: Gaspar Cola, MD Date: 02/10/2020; Time: 10:14 AM

## 2020-02-10 ENCOUNTER — Other Ambulatory Visit: Payer: Self-pay

## 2020-02-10 ENCOUNTER — Ambulatory Visit: Payer: Medicaid Other | Attending: Pain Medicine | Admitting: Pain Medicine

## 2020-02-10 DIAGNOSIS — M542 Cervicalgia: Secondary | ICD-10-CM | POA: Diagnosis not present

## 2020-02-10 DIAGNOSIS — M25551 Pain in right hip: Secondary | ICD-10-CM | POA: Diagnosis not present

## 2020-02-10 DIAGNOSIS — G894 Chronic pain syndrome: Secondary | ICD-10-CM

## 2020-02-10 DIAGNOSIS — M545 Low back pain: Secondary | ICD-10-CM

## 2020-02-10 DIAGNOSIS — M79605 Pain in left leg: Secondary | ICD-10-CM

## 2020-02-10 DIAGNOSIS — M19011 Primary osteoarthritis, right shoulder: Secondary | ICD-10-CM

## 2020-02-10 DIAGNOSIS — M25511 Pain in right shoulder: Secondary | ICD-10-CM

## 2020-02-10 DIAGNOSIS — M79604 Pain in right leg: Secondary | ICD-10-CM

## 2020-02-10 DIAGNOSIS — G8929 Other chronic pain: Secondary | ICD-10-CM

## 2020-02-10 NOTE — Patient Instructions (Signed)
____________________________________________________________________________________________  Preparing for Procedure with Sedation  Procedure appointments are limited to planned procedures: . No Prescription Refills. . No disability issues will be discussed. . No medication changes will be discussed.  Instructions: . Oral Intake: Do not eat or drink anything for at least 8 hours prior to your procedure. (Exception: Blood Pressure Medication. See below.) . Transportation: Unless otherwise stated by your physician, you may drive yourself after the procedure. . Blood Pressure Medicine: Do not forget to take your blood pressure medicine with a sip of water the morning of the procedure. If your Diastolic (lower reading)is above 100 mmHg, elective cases will be cancelled/rescheduled. . Blood thinners: These will need to be stopped for procedures. Notify our staff if you are taking any blood thinners. Depending on which one you take, there will be specific instructions on how and when to stop it. . Diabetics on insulin: Notify the staff so that you can be scheduled 1st case in the morning. If your diabetes requires high dose insulin, take only  of your normal insulin dose the morning of the procedure and notify the staff that you have done so. . Preventing infections: Shower with an antibacterial soap the morning of your procedure. . Build-up your immune system: Take 1000 mg of Vitamin C with every meal (3 times a day) the day prior to your procedure. . Antibiotics: Inform the staff if you have a condition or reason that requires you to take antibiotics before dental procedures. . Pregnancy: If you are pregnant, call and cancel the procedure. . Sickness: If you have a cold, fever, or any active infections, call and cancel the procedure. . Arrival: You must be in the facility at least 30 minutes prior to your scheduled procedure. . Children: Do not bring children with you. . Dress appropriately:  Bring dark clothing that you would not mind if they get stained. . Valuables: Do not bring any jewelry or valuables.  Reasons to call and reschedule or cancel your procedure: (Following these recommendations will minimize the risk of a serious complication.) . Surgeries: Avoid having procedures within 2 weeks of any surgery. (Avoid for 2 weeks before or after any surgery). . Flu Shots: Avoid having procedures within 2 weeks of a flu shots or . (Avoid for 2 weeks before or after immunizations). . Barium: Avoid having a procedure within 7-10 days after having had a radiological study involving the use of radiological contrast. (Myelograms, Barium swallow or enema study). . Heart attacks: Avoid any elective procedures or surgeries for the initial 6 months after a "Myocardial Infarction" (Heart Attack). . Blood thinners: It is imperative that you stop these medications before procedures. Let us know if you if you take any blood thinner.  . Infection: Avoid procedures during or within two weeks of an infection (including chest colds or gastrointestinal problems). Symptoms associated with infections include: Localized redness, fever, chills, night sweats or profuse sweating, burning sensation when voiding, cough, congestion, stuffiness, runny nose, sore throat, diarrhea, nausea, vomiting, cold or Flu symptoms, recent or current infections. It is specially important if the infection is over the area that we intend to treat. . Heart and lung problems: Symptoms that may suggest an active cardiopulmonary problem include: cough, chest pain, breathing difficulties or shortness of breath, dizziness, ankle swelling, uncontrolled high or unusually low blood pressure, and/or palpitations. If you are experiencing any of these symptoms, cancel your procedure and contact your primary care physician for an evaluation.  Remember:  Regular Business hours are:    Monday to Thursday 8:00 AM to 4:00 PM  Provider's  Schedule: Nils Thor, MD:  Procedure days: Tuesday and Thursday 7:30 AM to 4:00 PM  Bilal Lateef, MD:  Procedure days: Monday and Wednesday 7:30 AM to 4:00 PM ____________________________________________________________________________________________    

## 2020-02-18 ENCOUNTER — Other Ambulatory Visit: Payer: Self-pay

## 2020-02-18 ENCOUNTER — Ambulatory Visit
Admission: RE | Admit: 2020-02-18 | Discharge: 2020-02-18 | Disposition: A | Discharge status: Home / Self Care | Payer: Medicaid Other | Source: Ambulatory Visit | Attending: Pain Medicine | Admitting: Pain Medicine

## 2020-02-18 DIAGNOSIS — M25511 Pain in right shoulder: Secondary | ICD-10-CM | POA: Diagnosis present

## 2020-02-18 DIAGNOSIS — G8929 Other chronic pain: Secondary | ICD-10-CM | POA: Insufficient documentation

## 2020-02-18 DIAGNOSIS — M19011 Primary osteoarthritis, right shoulder: Secondary | ICD-10-CM

## 2020-02-27 ENCOUNTER — Ambulatory Visit
Admission: RE | Admit: 2020-02-27 | Discharge: 2020-02-27 | Disposition: A | Discharge status: Home / Self Care | Payer: Medicaid Other | Source: Ambulatory Visit | Attending: Pain Medicine | Admitting: Pain Medicine

## 2020-02-27 ENCOUNTER — Other Ambulatory Visit: Payer: Self-pay

## 2020-02-27 ENCOUNTER — Encounter: Payer: Self-pay | Admitting: Pain Medicine

## 2020-02-27 ENCOUNTER — Ambulatory Visit (HOSPITAL_BASED_OUTPATIENT_CLINIC_OR_DEPARTMENT_OTHER): Payer: Medicaid Other | Admitting: Pain Medicine

## 2020-02-27 VITALS — BP 122/86 | HR 75 | Temp 97.1°F | Resp 20 | Ht 72.0 in | Wt 243.0 lb

## 2020-02-27 DIAGNOSIS — M62838 Other muscle spasm: Secondary | ICD-10-CM | POA: Diagnosis not present

## 2020-02-27 DIAGNOSIS — M792 Neuralgia and neuritis, unspecified: Secondary | ICD-10-CM

## 2020-02-27 DIAGNOSIS — M19011 Primary osteoarthritis, right shoulder: Secondary | ICD-10-CM | POA: Insufficient documentation

## 2020-02-27 DIAGNOSIS — M7918 Myalgia, other site: Secondary | ICD-10-CM | POA: Insufficient documentation

## 2020-02-27 DIAGNOSIS — G8929 Other chronic pain: Secondary | ICD-10-CM | POA: Insufficient documentation

## 2020-02-27 DIAGNOSIS — M25511 Pain in right shoulder: Secondary | ICD-10-CM | POA: Diagnosis not present

## 2020-02-27 DIAGNOSIS — M7551 Bursitis of right shoulder: Secondary | ICD-10-CM | POA: Insufficient documentation

## 2020-02-27 DIAGNOSIS — M542 Cervicalgia: Secondary | ICD-10-CM | POA: Insufficient documentation

## 2020-02-27 MED ORDER — ROPIVACAINE HCL 2 MG/ML IJ SOLN
9.0000 mL | Freq: Once | INTRAMUSCULAR | Status: AC
  Administered 2020-02-27: 9 mL
  Filled 2020-02-27: qty 10

## 2020-02-27 MED ORDER — LIDOCAINE HCL 2 % IJ SOLN
20.0000 mL | Freq: Once | INTRAMUSCULAR | Status: AC
  Administered 2020-02-27: 400 mg
  Filled 2020-02-27: qty 20

## 2020-02-27 MED ORDER — GABAPENTIN 300 MG PO CAPS: 300.0000 mg | ORAL_CAPSULE | Freq: Four times a day (QID) | ORAL | 5 refills | Status: DC

## 2020-02-27 MED ORDER — TRIAMCINOLONE ACETONIDE 40 MG/ML IJ SUSP
40.0000 mg | Freq: Once | INTRAMUSCULAR | Status: AC
  Administered 2020-02-27: 40 mg
  Filled 2020-02-27: qty 1

## 2020-02-27 MED ORDER — ROPIVACAINE HCL 2 MG/ML IJ SOLN
9.0000 mL | Freq: Once | INTRAMUSCULAR | Status: AC
  Administered 2020-02-27: 9 mL via INTRA_ARTICULAR
  Filled 2020-02-27: qty 10

## 2020-02-27 MED ORDER — BACLOFEN 10 MG PO TABS: 10.0000 mg | ORAL_TABLET | Freq: Four times a day (QID) | ORAL | 5 refills | Status: DC | PRN

## 2020-02-27 MED ORDER — METHYLPREDNISOLONE ACETATE 80 MG/ML IJ SUSP
80.0000 mg | Freq: Once | INTRAMUSCULAR | Status: AC
  Administered 2020-02-27: 80 mg via INTRA_ARTICULAR
  Filled 2020-02-27: qty 1

## 2020-02-27 NOTE — Patient Instructions (Addendum)
____________________________________________________________________________________________  Post-Procedure Discharge Instructions  Instructions:  Apply ice:   Purpose: This will minimize any swelling and discomfort after procedure.   When: Day of procedure, as soon as you get home.  How: Fill a plastic sandwich bag with crushed ice. Cover it with a small towel and apply to injection site.  How long: (15 min on, 15 min off) Apply for 15 minutes then remove x 15 minutes.  Repeat sequence on day of procedure, until you go to bed.  Apply heat:   Purpose: To treat any soreness and discomfort from the procedure.  When: Starting the next day after the procedure.  How: Apply heat to procedure site starting the day following the procedure.  How long: May continue to repeat daily, until discomfort goes away.  Food intake: Start with clear liquids (like water) and advance to regular food, as tolerated.   Physical activities: Keep activities to a minimum for the first 8 hours after the procedure. After that, then as tolerated.  Driving: If you have received any sedation, be responsible and do not drive. You are not allowed to drive for 24 hours after having sedation.  Blood thinner: (Applies only to those taking blood thinners) You may restart your blood thinner 6 hours after your procedure.  Insulin: (Applies only to Diabetic patients taking insulin) As soon as you can eat, you may resume your normal dosing schedule.  Infection prevention: Keep procedure site clean and dry. Shower daily and clean area with soap and water.  Post-procedure Pain Diary: Extremely important that this be done correctly and accurately. Recorded information will be used to determine the next step in treatment. For the purpose of accuracy, follow these rules:  Evaluate only the area treated. Do not report or include pain from an untreated area. For the purpose of this evaluation, ignore all other areas of pain,  except for the treated area.  After your procedure, avoid taking a long nap and attempting to complete the pain diary after you wake up. Instead, set your alarm clock to go off every hour, on the hour, for the initial 8 hours after the procedure. Document the duration of the numbing medicine, and the relief you are getting from it.  Do not go to sleep and attempt to complete it later. It will not be accurate. If you received sedation, it is likely that you were given a medication that may cause amnesia. Because of this, completing the diary at a later time may cause the information to be inaccurate. This information is needed to plan your care.  Follow-up appointment: Keep your post-procedure follow-up evaluation appointment after the procedure (usually 2 weeks for most procedures, 6 weeks for radiofrequencies). DO NOT FORGET to bring you pain diary with you.   Expect: (What should I expect to see with my procedure?)  From numbing medicine (AKA: Local Anesthetics): Numbness or decrease in pain. You may also experience some weakness, which if present, could last for the duration of the local anesthetic.  Onset: Full effect within 15 minutes of injected.  Duration: It will depend on the type of local anesthetic used. On the average, 1 to 8 hours.   From steroids (Applies only if steroids were used): Decrease in swelling or inflammation. Once inflammation is improved, relief of the pain will follow.  Onset of benefits: Depends on the amount of swelling present. The more swelling, the longer it will take for the benefits to be seen. In some cases, up to 10 days.    Duration: Steroids will stay in the system x 2 weeks. Duration of benefits will depend on multiple posibilities including persistent irritating factors.  Side-effects: If present, they may typically last 2 weeks (the duration of the steroids).  Frequent: Cramps (if they occur, drink Gatorade and take over-the-counter Magnesium 450-500 mg  once to twice a day); water retention with temporary weight gain; increases in blood sugar; decreased immune system response; increased appetite.  Occasional: Facial flushing (red, warm cheeks); mood swings; menstrual changes.  Uncommon: Long-term decrease or suppression of natural hormones; bone thinning. (These are more common with higher doses or more frequent use. This is why we prefer that our patients avoid having any injection therapies in other practices.)   Very Rare: Severe mood changes; psychosis; aseptic necrosis.  From procedure: Some discomfort is to be expected once the numbing medicine wears off. This should be minimal if ice and heat are applied as instructed.  Call if: (When should I call?)  You experience numbness and weakness that gets worse with time, as opposed to wearing off.  New onset bowel or bladder incontinence. (Applies only to procedures done in the spine)  Emergency Numbers:  Durning business hours (Monday - Thursday, 8:00 AM - 4:00 PM) (Friday, 9:00 AM - 12:00 Noon): [phone]  After hours: [phone]  NOTE: If you are having a problem and are unable connect with, or to talk to a provider, then go to your nearest urgent care or emergency department. If the problem is serious and urgent, please call 911. ____________________________________________________________________________________________   ____________________________________________________________________________________________  Muscle Spasms & Cramps  Cause:  The most common cause of muscle spasms and cramps is vitamin and/or electrolyte (calcium, potassium, sodium, etc.) deficiencies.  Possible triggers: Sweating - causes loss of electrolytes thru the skin. Steroids - causes loss of electrolytes thru the urine.  Treatment: 1. Gatorade (or any other electrolyte-replenishing drink) - Take 1, 8 oz glass with each meal (3 times a day). 2. OTC (over-the-counter) Magnesium 400 to  500 mg - Take 1 tablet twice a day (one with breakfast and one before bedtime). If you have kidney problems, talk to your primary care physician before taking any Magnesium. 3. Tonic Water with quinine - Take 1, 8 oz glass before bedtime.   ____________________________________________________________________________________________    

## 2020-02-27 NOTE — Progress Notes (Signed)
PROVIDER NOTE: Information contained herein reflects review and annotations entered in association with encounter. Interpretation of such information and data should be left to medically-trained personnel. Information provided to patient can be located elsewhere in the medical record under "Patient Instructions". Document created using STT-dictation technology, any transcriptional errors that may result from process are unintentional.    Patient: Mark Vang  Service Category: Procedure  Provider: Gaspar Cola, MD  DOB: August 18, 1961  DOS: 02/27/2020  Location: [facility]  MRN: [id number]  Setting: Ambulatory - outpatient  Referring Provider: Remi Haggard, FNP  Type: Established Patient  Specialty: Interventional Pain Management  PCP: Remi Haggard, FNP   Primary Reason for Visit: Interventional Pain Management Treatment. CC: Neck Pain, Shoulder Pain (right), and Headache  Procedure #1:  Anesthesia, Analgesia, Anxiolysis:  Type: Right cervical Trigger Point Injection (1-2 muscle groups) #2  CPT: 20552 Primary Purpose: Therapeutic Region: Posterior Cervicothoracic Level: Cervico-thoracic Target Area: Open part of the trapezius muscle, splenius capitis muscle, and levator scapular muscle Trigger Point Approach: Percutaneous, ipsilateral approach. Laterality: Right-Sided Paravertebral  Type: Local Anesthesia Indication(s): Analgesia         Local Anesthetic: Lidocaine 1-2% Route: Infiltration ([place]/IM) IV Access: Declined Sedation: Declined   Position: Sitting   Indications: 1. Cervicalgia   2. Trigger point of neck   3. Muscle spasms of neck     Procedure #2:  Anesthesia, Analgesia, Anxiolysis:  Type: Therapeutic Acromio-clavicular Joint and subacromial bursa Injection #2  Primary Purpose: Therapeutic Region: Anterior Shoulder Area Level:  Shoulder Target Area: Acromio-clavicular Joint and subacromial bursa Approach: Anterior approach. Laterality:  Right  Type: Local Anesthesia Indication(s): Analgesia         Route: Infiltration (/IM) IV Access: Declined Sedation: Declined  Local Anesthetic: Lidocaine 1-2%  Position: Supine   Indications: 1. Chronic shoulder pain (Right)   2. Osteoarthritis of shoulder (Right)   3. Subacromial bursitis of shoulder (Right)   Pain Score: Pre-procedure: 9 /10 Post-procedure: 4 (4 shoulder 0/10 neck122/86)/10   Note: The patient indicates that the last injection that I did on his right shoulder provided him with complete relief of the pain for 2 years.  He is hoping that this one will do just is good.  Pre-op Assessment:  Mr. Mark Vang is a 59 y.o. (year old), male patient, seen today for interventional treatment. He  has a past surgical history that includes Knee surgery and Nasal sinus surgery. Mr. Mark Vang has a current medication list which includes the following prescription(s): amlodipine, aspirin, atorvastatin, baclofen, buspirone, cholecalciferol, cyclobenzaprine, fenofibrate, fluticasone, fluticasone-umeclidin-vilant, gabapentin, emgality, hydrochlorothiazide, lisinopril, meloxicam, multivitamin, nortriptyline, oxygen-helium, pantoprazole, potassium citrate, daliresp, tamsulosin, stiolto respimat, and daliresp. His primarily concern today is the Neck Pain, Shoulder Pain (right), and Headache  Initial Vital Signs:  Pulse/HCG Rate: 78  Temp: (!) 97.1 F (36.2 C) Resp: 18 BP: 128/84 SpO2: 98 %  BMI: Estimated body mass index is 32.96 kg/m as calculated from the following:   Height as of this encounter: 6' (1.829 m).   Weight as of this encounter: 243 lb (110.2 kg).  Risk Assessment: Allergies: Reviewed. He is allergic to amlodipine besy-benazepril hcl; amlodipine; and hydrochlorothiazide.  Allergy Precautions: None required Coagulopathies: Reviewed. None identified.  Blood-thinner therapy: None at this time Active Infection(s): Reviewed. None identified. Mr. Mark Vang is  afebrile  Site Confirmation: Mr. Mark Vang was asked to confirm the procedure and laterality before marking the site Procedure checklist: Completed Consent: Before the procedure and under the influence of no sedative(s), amnesic(s),  or anxiolytics, the patient was informed of the treatment options, risks and possible complications. To fulfill our ethical and legal obligations, as recommended by the American Medical Association's Code of Ethics, I have informed the patient of my clinical impression; the nature and purpose of the treatment or procedure; the risks, benefits, and possible complications of the intervention; the alternatives, including doing nothing; the risk(s) and benefit(s) of the alternative treatment(s) or procedure(s); and the risk(s) and benefit(s) of doing nothing. The patient was provided information about the general risks and possible complications associated with the procedure. These may include, but are not limited to: failure to achieve desired goals, infection, bleeding, organ or nerve damage, allergic reactions, paralysis, and death. In addition, the patient was informed of those risks and complications associated to the procedure, such as failure to decrease pain; infection; bleeding; organ or nerve damage with subsequent damage to sensory, motor, and/or autonomic systems, resulting in permanent pain, numbness, and/or weakness of one or several areas of the body; allergic reactions; (i.e.: anaphylactic reaction); and/or death. Furthermore, the patient was informed of those risks and complications associated with the medications. These include, but are not limited to: allergic reactions (i.e.: anaphylactic or anaphylactoid reaction(s)); adrenal axis suppression; blood sugar elevation that in diabetics may result in ketoacidosis or comma; water retention that in patients with history of congestive heart failure may result in shortness of breath, pulmonary edema, and decompensation with  resultant heart failure; weight gain; swelling or edema; medication-induced neural toxicity; particulate matter embolism and blood vessel occlusion with resultant organ, and/or nervous system infarction; and/or aseptic necrosis of one or more joints. Finally, the patient was informed that Medicine is not an exact science; therefore, there is also the possibility of unforeseen or unpredictable risks and/or possible complications that may result in a catastrophic outcome. The patient indicated having understood very clearly. We have given the patient no guarantees and we have made no promises. Enough time was given to the patient to ask questions, all of which were answered to the patient's satisfaction. Mr. Topp has indicated that he wanted to continue with the procedure. Attestation: I, the ordering provider, attest that I have discussed with the patient the benefits, risks, side-effects, alternatives, likelihood of achieving goals, and potential problems during recovery for the procedure that I have provided informed consent. Date  Time: 02/27/2020 10:08 AM  Pre-Procedure Preparation:  Monitoring: As per clinic protocol. Respiration, ETCO2, SpO2, BP, heart rate and rhythm monitor placed and checked for adequate function Safety Precautions: Patient was assessed for positional comfort and pressure points before starting the procedure. Time-out: I initiated and conducted the "Time-out" before starting the procedure, as per protocol. The patient was asked to participate by confirming the accuracy of the "Time Out" information. Verification of the correct person, site, and procedure were performed and confirmed by me, the nursing staff, and the patient. "Time-out" conducted as per Joint Commission's Universal Protocol (UP.01.01.01). Time: 1045  Description of Procedure #1:  Area Prepped: Entire Posterior Cervicothoracic Region DuraPrep (Iodine Povacrylex [0.7% available iodine] and Isopropyl Alcohol, 74%  w/w) Safety Precautions: Aspiration looking for blood return was conducted prior to all injections. At no point did we inject any substances, as a needle was being advanced. No attempts were made at seeking any paresthesias. Safe injection practices and needle disposal techniques used. Medications properly checked for expiration dates. SDV (single dose vial) medications used. Description of the Procedure: Protocol guidelines were followed. The patient was placed in position over the fluoroscopy table.  The target area was identified and the area prepped in the usual manner. Skin & deeper tissues infiltrated with local anesthetic. Appropriate amount of time allowed to pass for local anesthetics to take effect. The procedure needles were then advanced to the target area. Proper needle placement secured. Negative aspiration confirmed. Solution injected in intermittent fashion, asking for systemic symptoms every 0.5cc of injectate. The needles were then removed and the area cleansed, making sure to leave some of the prepping solution back to take advantage of its long term bactericidal properties.  Start Time: 1045 hrs. Materials:  Needle(s) Type: Regular needle Gauge: 25G Length: 1.5-in Medication(s): Please see orders for medications and dosing details.  Imaging Guidance for procedure #1:  Type of Imaging Technique: None used Indication(s): N/A Exposure Time: No patient exposure Contrast: None used. Fluoroscopic Guidance: N/A Ultrasound Guidance: N/A Interpretation: N/A  Description of Procedure #2:  Area Prepped: Entire anterior shoulder Area DuraPrep (Iodine Povacrylex [0.7% available iodine] and Isopropyl Alcohol, 74% w/w) Safety Precautions: Aspiration looking for blood return was conducted prior to all injections. At no point did we inject any substances, as a needle was being advanced. No attempts were made at seeking any paresthesias. Safe injection practices and needle disposal techniques  used. Medications properly checked for expiration dates. SDV (single dose vial) medications used. Description of the Procedure: Protocol guidelines were followed. The patient was placed in position over the procedure table. The target area was identified and the area prepped in the usual manner. Skin & deeper tissues infiltrated with local anesthetic. Appropriate amount of time allowed to pass for local anesthetics to take effect. The procedure needles were then advanced to the target area. Proper needle placement secured. Negative aspiration confirmed. Solution injected in intermittent fashion, asking for systemic symptoms every 0.5cc of injectate. The needles were then removed and the area cleansed, making sure to leave some of the prepping solution back to take advantage of its long term bactericidal properties.         Vitals:   02/27/20 1009 02/27/20 1040 02/27/20 1050 02/27/20 1056  BP: 128/84 112/79 120/84 122/86  Pulse: 78 85 76 75  Resp: 18 17 (!) 22 20  Temp:      SpO2: 98% 97% 98% 96%  Weight:      Height:       End Time: 1053 hrs. Materials:  Needle(s) Type: Spinal Needle Gauge: 22G Length: 3.5-in Medication(s): Please see orders for medications and dosing details.  Imaging Guidance (Non-Spinal) for procedure #2:  Type of Imaging Technique: Fluoroscopy Guidance (Non-Spinal) Indication(s): Assistance in needle guidance and placement for procedures requiring needle placement in or near specific anatomical locations not easily accessible without such assistance. Exposure Time: Please see nurses notes. Contrast: None used. Fluoroscopic Guidance: I was personally present during the use of fluoroscopy. "Tunnel Vision Technique" used to obtain the best possible view of the target area. Parallax error corrected before commencing the procedure. "Direction-depth-direction" technique used to introduce the needle under continuous pulsed fluoroscopy. Once target was reached,  antero-posterior, oblique, and lateral fluoroscopic projection used confirm needle placement in all planes. Images permanently stored in EMR. Interpretation: No contrast injected. I personally interpreted the imaging intraoperatively. Adequate needle placement confirmed in multiple planes. Permanent images saved into the patient's record.  Antibiotic Prophylaxis:   Anti-infectives (From admission, onward)   None     Indication(s): None identified  Post-operative Assessment:  Post-procedure Vital Signs:  Pulse/HCG Rate: 75  Temp: (!) 97.1 F (36.2 C) Resp: 20  BP: 122/86 SpO2: 96 %  EBL: None  Complications: No immediate post-treatment complications observed by team, or reported by patient.  Note: The patient tolerated the entire procedure well. A repeat set of vitals were taken after the procedure and the patient was kept under observation following institutional policy, for this type of procedure. Post-procedural neurological assessment was performed, showing return to baseline, prior to discharge. The patient was provided with post-procedure discharge instructions, including a section on how to identify potential problems. Should any problems arise concerning this procedure, the patient was given instructions to immediately contact us, at any time, without hesitation. In any case, we plan to contact the patient by telephone for a follow-up status report regarding this interventional procedure.  Comments:  No additional relevant information.  Plan of Care  Orders:  Orders Placed This Encounter  Procedures  . TRIGGER POINT INJECTION    Scheduling Instructions:     Area: Neck     Side: Right     Sedation: No sedation     Timeframe: Today    Order Specific Question:   Where will this procedure be performed?    Answer:   [facility]  . SHOULDER INJECTION    Scheduling Instructions:     Side: Right-sided     Sedation: No Sedation.     Timeframe: Today    Order  Specific Question:   Where will this procedure be performed?    Answer:   [facility]    Comments:   by Dr. Dossie Arbour  . [facility] C-ARM 1-60 MIN NO REPORT    Intraoperative interpretation by procedural physician at [place].    Standing Status:   Standing    Number of Occurrences:   1    Order Specific Question:   Reason for exam:    Answer:   Assistance in needle guidance and placement for procedures requiring needle placement in or near specific anatomical locations not easily accessible without such assistance.  . Informed Consent Details: Physician/Practitioner Attestation; Transcribe to consent form and obtain patient signature    Provider Attestation: I, [place] Dossie Arbour, MD, (Pain Management Specialist), the physician/practitioner, attest that I have discussed with the patient the benefits, risks, side effects, alternatives, likelihood of achieving goals and potential problems during recovery for the procedure that I have provided informed consent.    Scheduling Instructions:     Procedure: Myoneural Block (Trigger Point injection)     Indications: Musculoskeletal pain/myofascial pain secondary to trigger point     Note: Always confirm laterality of pain with Mr. Biggio, before procedure.     Transcribe to consent form and obtain patient signature.  . Provide equipment / supplies at bedside    Equipment required: Single use, disposable, "Block Tray"    Standing Status:   Standing    Number of Occurrences:   1    Order Specific Question:   Specify    Answer:   Block Tray  . Informed Consent Details: Physician/Practitioner Attestation; Transcribe to consent form and obtain patient signature    Nursing Order: Transcribe to consent form and obtain patient signature. Note: Always confirm laterality of pain with Mr. Kalm, before procedure. Procedure: Shoulder joint injection (glenohumeral and/or acromioclavicular joint) Indication/Reason: Diagnosis  and/for treatment of shoulder pain (arthralgia) secondary to shoulder joint problems (arthropathy). Provider Attestation: I, [place] Dossie Arbour, MD, (Pain Management Specialist), the physician/practitioner, attest that I have discussed with the patient the benefits, risks, side effects, alternatives, likelihood  of achieving goals and potential problems during recovery for the procedure that I have provided informed consent.   Chronic Opioid Analgesic:  No opioid analgesics prescribed by our practice.   Medications ordered for procedure: Meds ordered this encounter  Medications  . ropivacaine (PF) 2 mg/mL (0.2%) (NAROPIN) injection 9 mL  . lidocaine (XYLOCAINE) 2 % (with pres) injection 400 mg  . ropivacaine (PF) 2 mg/mL (0.2%) (NAROPIN) injection 9 mL  . methylPREDNISolone acetate (DEPO-MEDROL) injection 80 mg  . triamcinolone acetonide (KENALOG-40) injection 40 mg  . baclofen (LIORESAL) 10 MG tablet    Sig: Take 1-2 tablets (10-20 mg total) by mouth 4 (four) times daily as needed for muscle spasms.    Dispense:  240 tablet    Refill:  5    Do not place this medication, or any other prescription from our practice, on "Automatic Refill". Patient may have prescription filled one day early if pharmacy is closed on scheduled refill date.  . gabapentin (NEURONTIN) 300 MG capsule    Sig: Take 1-3 capsules (300-900 mg total) by mouth 4 (four) times daily.    Dispense:  360 capsule    Refill:  5    Do not place this medication, or any other prescription from our practice, on "Automatic Refill". Patient may have prescription filled one day early if pharmacy is closed on scheduled refill date.   Medications administered: We administered ropivacaine (PF) 2 mg/mL (0.2%), lidocaine, ropivacaine (PF) 2 mg/mL (0.2%), methylPREDNISolone acetate, and triamcinolone acetonide.  See the medical record for exact dosing, route, and time of administration.  Follow-up plan:   Return in about 2 weeks (around  03/12/2020) for (VV), (PP).       Interventional treatment options: Planned, scheduled, and/or pending:   Therapeutic right paravertebral cervical TPI/MNB #2 + right IA shoulder injection #2 (today)   Under consideration:   Diagnostic LESI Diagnostic bilateral lumbar facet block Possible bilateral lumbar facetRFA Possible bilateral cervical facetRFA Diagnostic bilateral IA hip injections Diagnosticgreater occipital NB Diagnostic bilateral thumb injections   Therapeutic/palliative (PRN):   Diagnostic/therapeutic left CESI #3 (100/100/100) Diagnostic/therapeutic right cervical TPI/MNB #2 (<50//60/60/70/>50) Diagnostic/therapeutic bilateral cervical facet block #3 (100/100/100/50-75) Diagnostic/therapeutic right IA shoulder injection #3 (100) (did not keep follow-up) Therapeutic right paravertebral cervical TPI/MNB #3     Recent Visits Date Type Provider Dept  02/10/20 Telemedicine Milinda Pointer, MD [facility]  01/22/20 Telemedicine Milinda Pointer, MD [facility]  12/31/19 Procedure visit Milinda Pointer, MD [facility]  12/11/19 Telemedicine Milinda Pointer, MD [facility]  Showing recent visits within past 90 days and meeting all other requirements   Today's Visits Date Type Provider Dept  02/27/20 Procedure visit Milinda Pointer, MD [facility]  Showing today's visits and meeting all other requirements   Future Appointments Date Type Provider Dept  03/12/20 Appointment Milinda Pointer, MD [facility]  Showing future appointments within next 90 days and meeting all other requirements   Disposition: Discharge home  Discharge (Date  Time): 02/27/2020; 1058 hrs.   Primary Care Physician: Remi Haggard, FNP Location: [facility] Note by: Gaspar Cola, MD Date: 02/27/2020; Time: 11:06 AM  Disclaimer:  Medicine is not an Chief Strategy Officer. The only  guarantee in medicine is that nothing is guaranteed. It is important to note that the decision to proceed with this intervention was based on the information collected from the patient. The Data and conclusions were drawn from the patient's questionnaire, the interview, and  the physical examination. Because the information was provided in large part by the patient, it cannot be guaranteed that it has not been purposely or unconsciously manipulated. Every effort has been made to obtain as much relevant data as possible for this evaluation. It is important to note that the conclusions that lead to this procedure are derived in large part from the available data. Always take into account that the treatment will also be dependent on availability of resources and existing treatment guidelines, considered by other Pain Management Practitioners as being common knowledge and practice, at the time of the intervention. For Medico-Legal purposes, it is also important to point out that variation in procedural techniques and pharmacological choices are the acceptable norm. The indications, contraindications, technique, and results of the above procedure should only be interpreted and judged by a Board-Certified Interventional Pain Specialist with extensive familiarity and expertise in the same exact procedure and technique.

## 2020-02-27 NOTE — Progress Notes (Signed)
Safety precautions to be maintained throughout the outpatient stay will include: orient to surroundings, keep bed in low position, maintain call bell within reach at all times, provide assistance with transfer out of bed and ambulation.  

## 2020-02-28 ENCOUNTER — Telehealth: Payer: Self-pay | Admitting: *Deleted

## 2020-02-28 NOTE — Telephone Encounter (Signed)
No problems post procedure. 

## 2020-03-11 NOTE — Progress Notes (Signed)
Patient: Mark Vang  Service Category: E/M  Provider: Gaspar Cola, MD  DOB: [birth year]  DOS: 03/12/2020  Location: Office  MRN: [id number]  Setting: Ambulatory outpatient  Referring Provider: Remi Haggard, FNP  Type: Established Patient  Specialty: Interventional Pain Management  PCP: Remi Haggard, FNP  Location: Remote location  Delivery: TeleHealth     Virtual Encounter - Pain Management PROVIDER NOTE: Information contained herein reflects review and annotations entered in association with encounter. Interpretation of such information and data should be left to medically-trained personnel. Information provided to patient can be located elsewhere in the medical record under "Patient Instructions". Document created using STT-dictation technology, any transcriptional errors that may result from process are unintentional.    Contact & Pharmacy Preferred: [phone] Home: [phone] (home) Mobile: [phone] (mobile) E-mail: No e-mail address on record  [place], [place] [address] [place] [address] Phone: [phone] Fax: [phone]   Pre-screening  Mark Vang offered "in-person" vs "virtual" encounter. He indicated preferring virtual for this encounter.   Reason COVID-19*  Social distancing based on CDC and AMA recommendations.   I contacted Mark Vang on 03/12/2020 via telephone.      I clearly identified myself as Gaspar Cola, MD. I verified that I was speaking with the correct person using two identifiers (Name: Mark Vang, and date of birth: 1960-10-06).  Consent I sought verbal advanced consent from Mark Vang for virtual visit interactions. I informed Mark Vang of possible security and privacy concerns, risks, and limitations associated with providing "not-in-person" medical evaluation and management services. I also informed Mark Vang of the availability of  "in-person" appointments. Finally, I informed him that there would be a charge for the virtual visit and that he could be  personally, fully or partially, financially responsible for it. Mark Vang expressed understanding and agreed to proceed.   Historic Elements   Mark Vang is a 59 y.o. year old, male patient evaluated today after his last contact with our practice on 02/28/2020. Mark Vang  has a past medical history of COPD (chronic obstructive pulmonary disease) ([place]), Hypertension, and Stroke ([place]). He also  has a past surgical history that includes Knee surgery and Nasal sinus surgery. Mark Vang has a current medication list which includes the following prescription(s): amlodipine, aspirin, atorvastatin, baclofen, buspirone, cholecalciferol, cyclobenzaprine, fenofibrate, fluticasone, fluticasone-umeclidin-vilant, gabapentin, emgality, hydrochlorothiazide, lisinopril, meloxicam, multivitamin, nortriptyline, oxygen-helium, pantoprazole, potassium citrate, daliresp, daliresp, tamsulosin, and stiolto respimat. He  reports that he has been smoking cigarettes. He has been smoking about 1.00 pack per day. He has never used smokeless tobacco. He reports that he does not drink alcohol and does not use drugs. Mark Vang is allergic to amlodipine besy-benazepril hcl, amlodipine, and hydrochlorothiazide.   HPI  Today, he is being contacted for a post-procedure assessment.  The patient indicates doing great after his shoulder and trigger point injection.  At this point he refers having 100% relief of the pain.  However, he indicates having a lot more pain in that left lower back and left hip.  I have reviewed his x-rays of that area which were done in 2019 but he says that it has been worse and therefore I will set him up to come back for a face-to-face visit so that I can do a physical exam in office back and hip joints.  The prior x-rays would suggest that he has problems with the sacroiliac joint and  the facet  joints.  He calls the hip the area of the iliac crest.  Post-Procedure Evaluation  Procedure: Therapeutic right acromioclavicular joint and subacromioclavicular bursa injection #2 + right cervical paravertebral trigger point injection #2 under fluoroscopic guidance, no sedation Pre-procedure pain level: 9/10 Post-procedure: 4/10 (> 50% relief)  Sedation: None.  Effectiveness during initial hour after procedure(Ultra-Short Term Relief): 100 %.  Local anesthetic used: Long-acting (4-6 hours) Effectiveness: Defined as any analgesic benefit obtained secondary to the administration of local anesthetics. This carries significant diagnostic value as to the etiological location, or anatomical origin, of the pain. Duration of benefit is expected to coincide with the duration of the local anesthetic used.  Effectiveness during initial 4-6 hours after procedure(Short-Term Relief): 100 %.  Long-term benefit: Defined as any relief past the pharmacologic duration of the local anesthetics.  Effectiveness past the initial 6 hours after procedure(Long-Term Relief): 100 %.  Current benefits: Defined as benefit that persist at this time.   Analgesia:  90-100% better Function: Mark Vang reports improvement in function ROM: Mark Vang reports improvement in ROM  Pharmacotherapy Assessment  Analgesic: No opioid analgesics prescribed by our practice.   Monitoring: [place] PMP: PDMP reviewed during this encounter.       Pharmacotherapy: No side-effects or adverse reactions reported. Compliance: No problems identified. Effectiveness: Clinically acceptable. Plan: Refer to "POC".  UDS:  Summary  Date Value Ref Range Status  04/02/2018 FINAL  Final    Comment:    ==================================================================== TOXASSURE COMP DRUG ANALYSIS,UR ==================================================================== Test                             Result       Flag       Units Drug  Present and Declared for Prescription Verification   Oxcarbazepine MHD              PRESENT      EXPECTED    Oxcarbazepine MHD is the active metabolite of oxcarbazepine and    eslicarbazepine.   Salicylate                     PRESENT      EXPECTED Drug Present not Declared for Prescription Verification   Naproxen                       PRESENT      UNEXPECTED ==================================================================== Test                      Result    Flag   Units      Ref Range   Creatinine              275              mg/dL      >=20 ==================================================================== Declared Medications:  The flagging and interpretation on this report are based on the  following declared medications.  Unexpected results may arise from  inaccuracies in the declared medications.  **Note: The testing scope of this panel includes these medications:  Oxcarbazepine (Trileptal)  **Note: The testing scope of this panel does not include small to  moderate amounts of these reported medications:  Aspirin  **Note: The testing scope of this panel does not include following  reported medications:  Amlodipine (Norvasc)  Atorvastatin (Lipitor)  Dexlansoprazole (Dexilant)  Fluticasone  Fluticasone (Flonase)  Lisinopril  Multivitamin  Oxygen  Potassium  Umeclidinium  Vitamin D ==================================================================== For clinical consultation, please call [phone]. ====================================================================     Laboratory Chemistry Profile   Renal Lab Results  Component Value Date   BUN 27 (H) 04/16/2018   CREATININE 1.12 04/16/2018   BCR 11 03/13/2018   GFRAA >60 04/16/2018   GFRNONAA >60 04/16/2018     Hepatic Lab Results  Component Value Date   AST 25 04/16/2018   ALT 29 04/16/2018   ALBUMIN 4.2 04/16/2018   ALKPHOS 99 04/16/2018   LIPASE 23 08/24/2017     Electrolytes Lab Results   Component Value Date   NA 137 04/16/2018   K 4.6 04/16/2018   CL 104 04/16/2018   CALCIUM 9.1 04/16/2018   MG 2.0 04/02/2018     Bone Lab Results  Component Value Date   VD25OH 35.6 04/02/2018     Inflammation (CRP: Acute Phase) (ESR: Chronic Phase) Lab Results  Component Value Date   CRP <0.8 04/02/2018   ESRSEDRATE 2 04/02/2018       Note: Above Lab results reviewed.   Imaging  DG PAIN CLINIC C-ARM 1-60 MIN NO REPORT Fluoro was used, but no Radiologist interpretation will be provided.  Please refer to "NOTES" tab for provider progress note.  Assessment  The primary encounter diagnosis was Chronic shoulder pain (Right). Diagnoses of Subacromial bursitis of shoulder joint (Right), Osteoarthritis of shoulder (Right), Chronic neck pain (Primary Area of Pain) (Bilateral) (L>R), Chronic musculoskeletal pain, Trigger point of neck, and DDD (degenerative disc disease), cervical were also pertinent to this visit.  Plan of Care  Problem-specific:  No problem-specific Assessment & Plan notes found for this encounter.  Mark Vang has a current medication list which includes the following long-term medication(s): amlodipine, atorvastatin, baclofen, fenofibrate, fluticasone, gabapentin, hydrochlorothiazide, lisinopril, nortriptyline, daliresp, and daliresp.  Pharmacotherapy (Medications Ordered): No orders of the defined types were placed in this encounter.  Orders:  No orders of the defined types were placed in this encounter.  Follow-up plan:   Return for (F2F), E/M, (ASAP) to evaluate left low back and hip pain.      Interventional treatment options: Planned, scheduled, and/or pending:   Therapeutic right paravertebral cervical TPI/MNB #2 + right IA shoulder injection #2 (today)   Under consideration:   Diagnostic LESI Diagnostic bilateral lumbar facet block Possible bilateral lumbar facetRFA Possible bilateral cervical facetRFA Diagnostic bilateral IA  hip injections Diagnosticgreater occipital NB Diagnostic bilateral thumb injections   Therapeutic/palliative (PRN):   Diagnostic/therapeutic left CESI #3 (100/100/100) Diagnostic/therapeutic right cervical TPI/MNB #2 (<50//60/60/70/>50) Diagnostic/therapeutic bilateral cervical facet block #3 (100/100/100/50-75) Diagnostic/therapeutic right IA shoulder injection #3 (100) (did not keep follow-up) Therapeutic right paravertebral cervical TPI/MNB #3      Recent Visits Date Type Provider Dept  02/27/20 Procedure visit Milinda Pointer, MD [facility]  02/10/20 Telemedicine Milinda Pointer, MD [facility]  01/22/20 Telemedicine Milinda Pointer, MD [facility]  12/31/19 Procedure visit Milinda Pointer, MD [facility]  Showing recent visits within past 90 days and meeting all other requirements Today's Visits Date Type Provider Dept  03/12/20 Telemedicine Milinda Pointer, MD [facility]  Showing today's visits and meeting all other requirements Future Appointments No visits were found meeting these conditions. Showing future appointments within next 90 days and meeting all other requirements  I discussed the assessment and treatment plan with the patient. The patient was provided an opportunity to ask questions and all were answered. The patient agreed with the plan and demonstrated an understanding of the  instructions.  Patient advised to call back or seek an in-person evaluation if the symptoms or condition worsens.  Duration of encounter: 18 minutes.  Note by: Gaspar Cola, MD Date: 03/12/2020; Time: 6:55 PM

## 2020-03-12 ENCOUNTER — Ambulatory Visit: Payer: Medicaid Other | Attending: Pain Medicine | Admitting: Pain Medicine

## 2020-03-12 ENCOUNTER — Encounter: Payer: Self-pay | Admitting: Pain Medicine

## 2020-03-12 ENCOUNTER — Other Ambulatory Visit: Payer: Self-pay

## 2020-03-12 DIAGNOSIS — M7551 Bursitis of right shoulder: Secondary | ICD-10-CM

## 2020-03-12 DIAGNOSIS — M542 Cervicalgia: Secondary | ICD-10-CM

## 2020-03-12 DIAGNOSIS — M25511 Pain in right shoulder: Secondary | ICD-10-CM

## 2020-03-12 DIAGNOSIS — G8929 Other chronic pain: Secondary | ICD-10-CM

## 2020-03-12 DIAGNOSIS — M19011 Primary osteoarthritis, right shoulder: Secondary | ICD-10-CM

## 2020-03-12 DIAGNOSIS — M7918 Myalgia, other site: Secondary | ICD-10-CM

## 2020-03-12 DIAGNOSIS — M5030 Other cervical disc degeneration, unspecified cervical region: Secondary | ICD-10-CM

## 2020-04-06 ENCOUNTER — Other Ambulatory Visit: Payer: Self-pay

## 2020-04-06 ENCOUNTER — Emergency Department
Admission: EM | Admit: 2020-04-06 | Discharge: 2020-04-06 | Disposition: A | Discharge status: Home / Self Care | Payer: Medicaid Other | Attending: Emergency Medicine | Admitting: Emergency Medicine

## 2020-04-06 DIAGNOSIS — N179 Acute kidney failure, unspecified: Secondary | ICD-10-CM | POA: Insufficient documentation

## 2020-04-06 DIAGNOSIS — J449 Chronic obstructive pulmonary disease, unspecified: Secondary | ICD-10-CM | POA: Diagnosis not present

## 2020-04-06 DIAGNOSIS — E871 Hypo-osmolality and hyponatremia: Secondary | ICD-10-CM | POA: Insufficient documentation

## 2020-04-06 DIAGNOSIS — E860 Dehydration: Secondary | ICD-10-CM | POA: Insufficient documentation

## 2020-04-06 DIAGNOSIS — Z7982 Long term (current) use of aspirin: Secondary | ICD-10-CM | POA: Diagnosis not present

## 2020-04-06 DIAGNOSIS — F17210 Nicotine dependence, cigarettes, uncomplicated: Secondary | ICD-10-CM | POA: Insufficient documentation

## 2020-04-06 DIAGNOSIS — Z8673 Personal history of transient ischemic attack (TIA), and cerebral infarction without residual deficits: Secondary | ICD-10-CM | POA: Insufficient documentation

## 2020-04-06 DIAGNOSIS — Z7951 Long term (current) use of inhaled steroids: Secondary | ICD-10-CM | POA: Diagnosis not present

## 2020-04-06 DIAGNOSIS — R252 Cramp and spasm: Secondary | ICD-10-CM | POA: Diagnosis not present

## 2020-04-06 DIAGNOSIS — M791 Myalgia, unspecified site: Secondary | ICD-10-CM | POA: Diagnosis present

## 2020-04-06 DIAGNOSIS — I10 Essential (primary) hypertension: Secondary | ICD-10-CM | POA: Insufficient documentation

## 2020-04-06 DIAGNOSIS — Z79899 Other long term (current) drug therapy: Secondary | ICD-10-CM | POA: Diagnosis not present

## 2020-04-06 LAB — COMPREHENSIVE METABOLIC PANEL
ALT: 28 U/L (ref 0–44)
AST: 22 U/L (ref 15–41)
Albumin: 4.6 g/dL (ref 3.5–5.0)
Alkaline Phosphatase: 67 U/L (ref 38–126)
Anion gap: 9 (ref 5–15)
BUN: 20 mg/dL (ref 8–23)
CO2: 24 mmol/L (ref 22–32)
Calcium: 9.9 mg/dL (ref 8.9–10.3)
Chloride: 95 mmol/L — ABNORMAL LOW (ref 98–111)
Creatinine, Ser: 1.61 mg/dL — ABNORMAL HIGH (ref 0.61–1.24)
GFR calc Af Amer: 53 mL/min — ABNORMAL LOW (ref >60)
GFR calc non Af Amer: 45 mL/min — ABNORMAL LOW (ref >60)
Glucose, Bld: 115 mg/dL — ABNORMAL HIGH (ref 70–99)
Potassium: 4.1 mmol/L (ref 3.5–5.1)
Sodium: 128 mmol/L — ABNORMAL LOW (ref 135–145)
Total Bilirubin: 0.5 mg/dL (ref 0.3–1.2)
Total Protein: 8.7 g/dL — ABNORMAL HIGH (ref 6.5–8.1)

## 2020-04-06 LAB — CBC
HCT: 43.2 % (ref 39.0–52.0)
Hemoglobin: 14.7 g/dL (ref 13.0–17.0)
MCH: 28.2 pg (ref 26.0–34.0)
MCHC: 34 g/dL (ref 30.0–36.0)
MCV: 82.9 fL (ref 80.0–100.0)
Platelets: 433 10*3/uL — ABNORMAL HIGH (ref 150–400)
RBC: 5.21 MIL/uL (ref 4.22–5.81)
RDW: 13.9 % (ref 11.5–15.5)
WBC: 20.8 10*3/uL — ABNORMAL HIGH (ref 4.0–10.5)
nRBC: 0 % (ref 0.0–0.2)

## 2020-04-06 LAB — LIPASE, BLOOD: Lipase: 25 U/L (ref 11–51)

## 2020-04-06 LAB — MAGNESIUM: Magnesium: 1.8 mg/dL (ref 1.7–2.4)

## 2020-04-06 LAB — CK: Total CK: 146 U/L (ref 49–397)

## 2020-04-06 MED ORDER — LORAZEPAM 2 MG/ML IJ SOLN
1.0000 mg | Freq: Once | INTRAMUSCULAR | Status: AC
  Administered 2020-04-06: 1 mg via INTRAVENOUS
  Filled 2020-04-06: qty 1

## 2020-04-06 MED ORDER — LACTATED RINGERS IV BOLUS
1000.0000 mL | Freq: Once | INTRAVENOUS | Status: AC
  Administered 2020-04-06: 1000 mL via INTRAVENOUS

## 2020-04-06 NOTE — ED Triage Notes (Signed)
Pt comes into the ED via EMS from home with c/o muscle spasms all over intermittently over the past week.

## 2020-04-06 NOTE — ED Provider Notes (Signed)
[facility] Emergency Department Provider Note  ____________________________________________   First MD Initiated Contact with Patient 04/06/20 1416     (approximate)  I have reviewed the triage vital signs and the nursing notes.   HISTORY  Chief Complaint Muscle Pain    HPI Mark Vang is a 59 y.o. male with COPD, hypertension, stroke who comes in with muscle pain.  Patient reports having pain all over his body with past 1 week however seems to be worse over the past day.  Patient is on baclofen, Flexeril, gabapentin and states that has not had any relief in his symptoms.  He states that the cramping sensations all over his body, and his arms and his right upper abdomen mostly but also in his legs and his back, severe, intermittent, not better with home medications, nothing makes it worse.  He denies any new medications.  Denies any fevers or shortness of breath different from his baseline.  He states that he was here previously for muscle cramping and was given some medications and woke up feeling better.   On review of records patient was seen on 09/03/2017 for muscle cramps was given 5 of IV Haldol and 2 of IV Ativan.  Patient stated that after he got this he fell asleep and woke up feeling much better but he was not quite sure why it happened.          Past Medical History:  Diagnosis Date  . COPD (chronic obstructive pulmonary disease) ([place])   . Hypertension   . Stroke [facility])     Patient Active Problem List   Diagnosis Date Noted  . Subacromial bursitis of shoulder joint (Right) 02/27/2020  . Osteoarthritis of shoulder (Right) 02/10/2020  . Chronic shoulder pain (Right) 08/01/2018  . Hypotension 07/17/2018  . Headache disorder 06/13/2018  . Cervicalgia 06/07/2018  . Cervical facet hypertrophy (Bilateral) 06/07/2018  . Abnormal MRI, cervical spine (05/17/2018) 05/31/2018  . Neurogenic pain 05/30/2018  . Cervical facet syndrome (Bilateral)  (L>R) 05/30/2018  . Spondylosis without myelopathy or radiculopathy, cervical region 05/30/2018  . Chronic musculoskeletal pain 05/10/2018  . Trigger point of neck 05/10/2018  . Osteoarthritis of spine with radiculopathy, cervical region 05/09/2018  . Chronic nonintractable headache 05/03/2018  . Chronic hip pain [facility] Area of Pain) (Right) 04/30/2018  . Chronic neck pain (Primary Area of Pain) (Bilateral) (L>R) 04/30/2018  . DDD (degenerative disc disease), cervical 04/30/2018  . Muscle spasms of neck 04/30/2018  . Opiate use 04/30/2018  . Cervicogenic headache 04/30/2018  . Occipital headache (Bilateral) 04/30/2018  . Cervico-occipital neuralgia (Bilateral) 04/30/2018  . Lumbar facet syndrome (Bilateral) 04/30/2018  . Lumbar facet arthropathy (Bilateral) 04/30/2018  . DDD (degenerative disc disease), lumbosacral 04/30/2018  . Chronic sacroiliac joint pain (Left) 04/30/2018  . Current tear knee, medial meniscus 04/27/2018  . Osteoarthritis of knee 04/27/2018  . Cervical radiculopathy 04/12/2018  . Chronic lower extremity pain [facility] Area of Pain) (Bilateral) (R>L) 04/02/2018  . Chronic low back pain (Secondary Area of Pain) (Bilateral) (R>L) 04/02/2018  . Chronic pain syndrome 04/02/2018  . Long term current use of opiate analgesic 04/02/2018  . Pharmacologic therapy 04/02/2018  . Disorder of skeletal system 04/02/2018  . Problems influencing health status 04/02/2018  . Seizure ([place]) 07/01/2016  . Sinusitis 07/01/2016  . Stroke ([place]) 07/01/2016  . COPD (chronic obstructive pulmonary disease) ([place]) 01/11/2013  . Dyspnea on exertion 01/11/2013  . GERD (gastroesophageal reflux disease) 01/11/2013  . HTN (hypertension) 01/11/2013  . Seizures ([place])  01/11/2013  . Tobacco abuse 01/11/2013  . Convulsions ([place]) 01/11/2013  . Chronic sinusitis 12/13/2012  . 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  KNEE SURGERY    . NASAL SINUS SURGERY      Prior to Admission medications   Medication Sig Start Date End Date Taking? Authorizing Provider  amLODipine (NORVASC) 5 MG tablet Take 5 mg by mouth daily.    [provider]  aspirin 325 MG EC tablet Take 325 mg by mouth daily.    [provider]  atorvastatin (LIPITOR) 40 MG tablet Take 40 mg by mouth daily.    [provider]  baclofen (LIORESAL) 10 MG tablet Take 1-2 tablets (10-20 mg total) by mouth 4 (four) times daily as needed for muscle spasms. 02/27/20 08/25/20  Milinda Pointer, MD  busPIRone (BUSPAR) 10 MG tablet Take 10 mg by mouth 3 (three) times daily.    [provider]  cholecalciferol (VITAMIN D) 1000 units tablet Take 50,000 Units by mouth once a week.     [provider]  cyclobenzaprine (FLEXERIL) 10 MG tablet Take 10 mg by mouth 3 (three) times daily as needed for muscle spasms.    [provider]  fenofibrate (TRICOR) 145 MG tablet Take 145 mg by mouth daily.    [provider]  fluticasone (FLONASE) 50 MCG/ACT nasal spray Place 1 spray into both nostrils daily.     [provider]  Fluticasone-Umeclidin-Vilant (TRELEGY ELLIPTA) 100-62.5-25 MCG/INH AEPB Inhale into the lungs.    [provider]  gabapentin (NEURONTIN) 300 MG capsule Take 1-3 capsules (300-900 mg total) by mouth 4 (four) times daily. 02/27/20 08/25/20  Milinda Pointer, MD  Galcanezumab-gnlm [facility]) 120 MG/ML SOAJ Inject 120 mg into the skin every 28 (twenty-eight) days.    [provider]  hydrochlorothiazide (HYDRODIURIL) 25 MG tablet Take 25 mg by mouth daily.    [provider]  lisinopril (PRINIVIL,ZESTRIL) 20 MG tablet Take 20 mg by mouth daily.    [provider]  meloxicam (MOBIC) 15 MG tablet Take 15 mg by mouth daily.    [provider]  Multiple Vitamin (MULTIVITAMIN) tablet Take 1 tablet by mouth daily.    [provider]    nortriptyline (PAMELOR) 10 MG capsule Take 10 mg by mouth at bedtime. Patient will titrate up to 20 mg after 7 days. 06/06/18   [provider]  OXYGEN Inhale into the lungs.    [provider]  pantoprazole (PROTONIX) 40 MG tablet Take 1 tablet by mouth daily. 04/10/18   [provider]  potassium citrate (UROCIT-K) 10 MEQ (1080 MG) SR tablet Take 10 mEq by mouth 3 (three) times daily with meals.    [provider]  Roflumilast (DALIRESP) 250 MCG TABS Take by mouth in the morning and at bedtime.    [provider]  roflumilast (DALIRESP) 500 MCG TABS tablet Take by mouth.    [provider]  tamsulosin (FLOMAX) 0.4 MG CAPS capsule Take 1 capsule (0.4 mg total) by mouth daily. 04/23/19   Zara Council A, PA-C  Tiotropium Bromide-Olodaterol (STIOLTO RESPIMAT) 2.5-2.5 MCG/ACT AERS Inhale into the lungs.    [provider]    Allergies Amlodipine besy-benazepril hcl, Amlodipine, and Hydrochlorothiazide  Family History  Problem Relation Age of Onset  . Cancer Mother   . Cancer Father   . Bladder Cancer Neg Hx   . Kidney cancer Neg  Hx   . Prostate cancer Neg Hx     Social History Social History   Tobacco Use  . Smoking status: Current Every Day Smoker    Packs/day: 1.00    Types: Cigarettes  . Smokeless tobacco: Never Used  . Tobacco comment: hx copd  Vaping Use  . Vaping Use: Never used  Substance Use Topics  . Alcohol use: No  . Drug use: No      Review of Systems Constitutional: No fever/chills, muscle cramping Eyes: No visual changes. ENT: No sore throat. Cardiovascular: Denies chest pain. Respiratory: Denies shortness of breath. Gastrointestinal: No abdominal pain.  No nausea, no vomiting.  No diarrhea.  No constipation. Genitourinary: Negative for dysuria. Musculoskeletal: Negative for back pain. Skin: Negative for rash. Neurological: Negative for headaches, focal weakness or numbness. All other ROS  negative ____________________________________________   PHYSICAL EXAM:  VITAL SIGNS: ED Triage Vitals  Enc Vitals Group     BP 04/06/20 1102 115/74     Pulse Rate 04/06/20 1102 86     Resp 04/06/20 1102 20     Temp 04/06/20 1102 97.8 F (36.6 C)     Temp Source 04/06/20 1102 Oral     SpO2 04/06/20 1102 98 %     Weight 04/06/20 1057 234 lb (106.1 kg)     Height 04/06/20 1057 6' (1.829 m)     Head Circumference --      Peak Flow --      Pain Score 04/06/20 1057 10     Pain Loc --      Pain Edu? --      Excl. in [place]? --     Constitutional: Alert and oriented. .  Patient is moving all around stating that he is in severe pain Eyes: Conjunctivae are normal. EOMI. Head: Atraumatic. Nose: No congestion/rhinnorhea. Mouth/Throat: Mucous membranes are moist.   Neck: No stridor. Trachea Midline. FROM Cardiovascular: Normal rate, regular rhythm. Grossly normal heart sounds.  Good peripheral circulation. Respiratory: Normal respiratory effort.  No retractions. Lungs CTAB. Gastrointestinal: Soft and nontender. No distention. No abdominal bruits.  Musculoskeletal: No lower extremity tenderness nor edema.  No joint effusions. Neurologic:  Normal speech and language. No gross focal neurologic deficits are appreciated.  Skin:  Skin is warm, dry and intact. No rash noted. Psychiatric: Mood and affect are normal. Speech and behavior are normal. GU: Deferred   ____________________________________________   LABS (all labs ordered are listed, but only abnormal results are displayed)  Labs Reviewed  COMPREHENSIVE METABOLIC PANEL - Abnormal; Notable for the following components:      Result Value   Sodium 128 (*)    Chloride 95 (*)    Glucose, Bld 115 (*)    Creatinine, Ser 1.61 (*)    Total Protein 8.7 (*)    GFR calc non Af Amer 45 (*)    GFR calc Af Amer 53 (*)    All other components within normal limits  CBC - Abnormal; Notable for the following components:   WBC 20.8 (*)     Platelets 433 (*)    All other components within normal limits  LIPASE, BLOOD  CK  MAGNESIUM  URINALYSIS, COMPLETE (UACMP) WITH MICROSCOPIC   ____________________________________________   ED ECG REPORT I, Vanessa Kenmore, the attending physician, personally viewed and interpreted this ECG.  Normal sinus rate of 72, no ST elevations, no T wave inversions, normal intervals ____________________________________________  INITIAL IMPRESSION / ASSESSMENT AND PLAN / ED COURSE  [name]  was evaluated in Emergency Department on 04/06/2020 for the symptoms described in the history of present illness. He was evaluated in the context of the global COVID-19 pandemic, which necessitated consideration that the patient might be at risk for infection with the SARS-CoV-2 virus that causes COVID-19. Institutional protocols and algorithms that pertain to the evaluation of patients at risk for COVID-19 are in a state of rapid change based on information released by regulatory bodies including the CDC and federal and state organizations. These policies and algorithms were followed during the patient's care in the ED.    Patient is a 59 year old who comes in with diffuse muscle cramping that he reports is severe.  Patient had this happen previously got better with Haldol and Ativan.  Will get labs to evaluate Electra abnormalities, AKI, hypomagnesia, hypokalemia, CK to evaluate for rhabdo.  His abdomen is soft and nontender and he denies of any chest pain is more of a muscle cramping that is all over his body.  Will get an EKG just to make sure this is not related to his heart but I have lower suspicion given his description of symptoms.  Sodium slightly low at 128 and his creatinine is 1.61 which is slightly elevated.  White count elevated at 20.8 patient denies any infectious symptoms.  Denies any fever or urinary symptoms.  Patient had chronically elevated white counts in the past.  At this time given he  denies does not have any symptoms and does not meet any other sirs criteria have lower suspicion this is related to an infection that would be causing his muscle cramping.  I discussed this with patient and explained return precautions in regards to his elevated white count.  CK level is normal  Given his abnormal labs will give 1 L of fluid and some Ativan and see if that helps  3:36 PM reevaluated patient he is asleep.  4:56 PM patient is requesting discharge home.  Patient states that he is feeling much better.  The muscle cramping has stopped.  He has follow-up with his primary care doctor this week for recheck of his BMP.  He understand that he can take Pedialyte to help with his sodium and chloride and he needs to stay well-hydrated.  He states that he wants to go home at this time because he is hungry wants to go get food.       ____________________________________________   FINAL CLINICAL IMPRESSION(S) / ED DIAGNOSES   Final diagnoses:  AKI (acute kidney injury) ([place])  Hyponatremia  Dehydration  Muscle cramps      MEDICATIONS GIVEN DURING THIS VISIT:  Medications  lactated ringers bolus 1,000 mL (0 mLs Intravenous Stopped 04/06/20 1644)  LORazepam (ATIVAN) injection 1 mg (1 mg Intravenous Given 04/06/20 1512)     ED Discharge Orders    None       Note:  This document was prepared using Dragon voice recognition software and may include unintentional dictation errors.   Vanessa Alamo, MD 04/06/20 [phone]

## 2020-04-06 NOTE — Discharge Instructions (Addendum)
Your kidney function was slightly elevated and your sodium and chloride were slightly low.  We gave you some fluids to help with dehydration.  You should have this rechecked with your primary care doctor to make sure that likely any lower.  You should drink Pedialyte.  Your white count was also elevated but there were no signs of infection at this time.  However if you develop fevers you need to return to the ER or if you have any other concerns

## 2020-04-16 ENCOUNTER — Other Ambulatory Visit: Payer: Self-pay | Admitting: Family Medicine

## 2020-04-16 DIAGNOSIS — N200 Calculus of kidney: Secondary | ICD-10-CM

## 2020-04-21 NOTE — Progress Notes (Signed)
04/22/2020 2:26 PM   [name] 07/22/1961 [id number]  Referring provider: Remi Haggard, [place],  [place] [address]  Chief Complaint  Patient presents with  . Follow-up    HPI: Mark Vang is a 59 y.o. male with nephrolithiasis who presents today for yearly follow up.   CT Renal stone study on 04/06/2018 noted normal adrenal glands.  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 Left upper pole calculus measures 4 mm. No ureteral calculi identified bilaterally. No mass or hydronephrosis. Urinary bladder appears normal.  KUB 04/22/2020 bilateral stones.    He would like to have a refill on the tamsulosin 0.4 mg daily.  He complains of nocturia every two hours over the last week.  He had recently been diagnosed with hyponatremia after suffering from cramps.  He has been increasing his Gater Aid intake to help offset this and the cramping.  Patient denies any modifying or aggravating factors.  Patient denies any gross hematuria, dysuria or suprapubic/flank pain.  Patient denies any fevers, chills, nausea or vomiting.   His UA today is unremarkable.     PMH: Past Medical History:  Diagnosis Date  . COPD (chronic obstructive pulmonary disease) ([place])   . Hypertension   . Stroke [facility])     Surgical History: Past Surgical History:  Procedure Laterality Date  . KNEE SURGERY    . NASAL SINUS SURGERY      Home Medications:  Allergies as of 04/22/2020      Reactions   Amlodipine Besy-benazepril Hcl Other (See Comments)   Scales, burning, itching, rash   Amlodipine    Hydrochlorothiazide       Medication List       Accurate as of April 22, 2020  2:26 PM. If you have any questions, ask your nurse or doctor.        amLODipine 5 MG tablet Commonly known as: NORVASC Take 5 mg by mouth daily.   aspirin 325 MG EC tablet Take 325 mg by mouth daily.   atorvastatin 40 MG tablet Commonly known as: LIPITOR Take 40 mg by mouth  daily.   baclofen 10 MG tablet Commonly known as: LIORESAL Take 1-2 tablets (10-20 mg total) by mouth 4 (four) times daily as needed for muscle spasms.   busPIRone 10 MG tablet Commonly known as: BUSPAR Take 10 mg by mouth 3 (three) times daily.   cholecalciferol 1000 units tablet Commonly known as: VITAMIN D Take 50,000 Units by mouth once a week.   cyclobenzaprine 10 MG tablet Commonly known as: FLEXERIL Take 10 mg by mouth 3 (three) times daily as needed for muscle spasms.   Daliresp 250 MCG Tabs Generic drug: Roflumilast Take by mouth in the morning and at bedtime.   Daliresp 500 MCG Tabs tablet Generic drug: roflumilast Take by mouth.   Emgality 120 MG/ML Soaj Generic drug: Galcanezumab-gnlm Inject 120 mg into the skin every 28 (twenty-eight) days.   fenofibrate 145 MG tablet Commonly known as: TRICOR Take 145 mg by mouth daily.   fluticasone 50 MCG/ACT nasal spray Commonly known as: FLONASE Place 1 spray into both nostrils daily.   gabapentin 300 MG capsule Commonly known as: NEURONTIN Take 1-3 capsules (300-900 mg total) by mouth 4 (four) times daily.   hydrochlorothiazide 25 MG tablet Commonly known as: HYDRODIURIL Take 25 mg by mouth daily.   lisinopril 20 MG tablet Commonly known as: ZESTRIL Take 20 mg by mouth daily.   meloxicam 15  MG tablet Commonly known as: MOBIC Take 15 mg by mouth daily.   multivitamin tablet Take 1 tablet by mouth daily.   nortriptyline 10 MG capsule Commonly known as: PAMELOR Take 10 mg by mouth at bedtime. Patient will titrate up to 20 mg after 7 days.   pantoprazole 40 MG tablet Commonly known as: PROTONIX Take 1 tablet by mouth daily.   potassium citrate 10 MEQ (1080 MG) SR tablet Commonly known as: UROCIT-K Take 10 mEq by mouth 3 (three) times daily with meals.   Stiolto Respimat 2.5-2.5 MCG/ACT Aers Generic drug: Tiotropium Bromide-Olodaterol Inhale into the lungs.   tamsulosin 0.4 MG Caps  capsule Commonly known as: FLOMAX Take 1 capsule (0.4 mg total) by mouth daily. What changed: Another medication with the same name was added. Make sure you understand how and when to take each. Changed by: Zara Council, PA-C   tamsulosin 0.4 MG Caps capsule Commonly known as: FLOMAX Take 1 capsule (0.4 mg total) by mouth daily. What changed: You were already taking a medication with the same name, and this prescription was added. Make sure you understand how and when to take each. Changed by: Zara Council, PA-C   Trelegy Ellipta 100-62.5-25 MCG/INH Aepb Generic drug: Fluticasone-Umeclidin-Vilant Inhale into the lungs.       Allergies:  Allergies  Allergen Reactions  . Amlodipine Besy-Benazepril Hcl Other (See Comments)    Scales, burning, itching, rash  . Amlodipine   . Hydrochlorothiazide     Family History: Family History  Problem Relation Age of Onset  . Cancer Mother   . Cancer Father   . Bladder Cancer Neg Hx   . Kidney cancer Neg Hx   . Prostate cancer Neg Hx     Social History:  reports that he has been smoking cigarettes. He has been smoking about 1.00 pack per day. He has never used smokeless tobacco. He reports that he does not drink alcohol and does not use drugs.  ROS: For pertinent review of systems please refer to history of present illness  Physical Exam: BP 117/70 (BP Location: Left Arm, Patient Position: Sitting, Cuff Size: Normal)   Pulse 88   Ht 6' (1.829 m)   Wt (!) 238 lb (108 kg)   BMI 32.28 kg/m   Constitutional:  Well nourished. Alert and oriented, No acute distress. HEENT: [place] AT, mask in place.  Trachea midline Cardiovascular: No clubbing, cyanosis, or edema. Respiratory: Normal respiratory effort, no increased work of breathing. Neurologic: Grossly intact, no focal deficits, moving all 4 extremities. Psychiatric: Normal mood and affect. Deferred GU and rectal exam  Laboratory Data: Lab Results  Component Value Date   WBC  20.8 (H) 04/06/2020   HGB 14.7 04/06/2020   HCT 43.2 04/06/2020   MCV 82.9 04/06/2020   PLT 433 (H) 04/06/2020    Lab Results  Component Value Date   CREATININE 1.61 (H) 04/06/2020     Lab Results  Component Value Date   HGBA1C 6.0 06/29/2012        Component Value Date/Time   CHOL 143 10/02/2013 0451   HDL 21 (L) 10/02/2013 0451   VLDL 31 10/02/2013 0451   LDLCALC 91 10/02/2013 0451    Lab Results  Component Value Date   AST 22 04/06/2020   Lab Results  Component Value Date   ALT 28 04/06/2020    I have reviewed the labs.  Pertinent Imaging CLINICAL DATA:  Follow-up kidney stones  EXAM: ABDOMEN - 1 VIEW  COMPARISON:  03/23/2019  FINDINGS: Scattered large and small bowel gas is noted. Retained fecal material is noted show. Stable phleboliths are noted within the pelvis. There is a rounded 3-4 mm calcification over the lower pole of the left kidney likely representing a nonobstructing renal stone.  IMPRESSION: Calcification of the left kidney as described.   Electronically Signed   By: Inez Catalina M.D.   On: 04/22/2020 22:38 I have independently reviewed the films.  See HPI.   Assessment & Plan:    1. Bilateral nephrolithiasis None seen on KUB Monitor with yearly KUB's - refilled Flomax  Advised to contact our office or seek treatment in the ED if becomes febrile or pain/ vomiting are difficult control in order to arrange for emergent/urgent intervention  2. PSA screening Followed by PCP Explained that some prostate cancer will not cause a change in the PSA and are detected by abnormalities found on the DRE - he defers   Return in about 1 year (around 04/22/2021) for KUB, UA and office visit .  These notes generated with voice recognition software. I apologize for typographical errors.  Zara Council, PA-C  [facility] [address]  [place], [place] [address] [phone]

## 2020-04-22 ENCOUNTER — Ambulatory Visit
Admission: RE | Admit: 2020-04-22 | Discharge: 2020-04-22 | Disposition: A | Discharge status: Home / Self Care | Payer: Medicaid Other | Attending: Urology | Admitting: Urology

## 2020-04-22 ENCOUNTER — Ambulatory Visit
Admission: RE | Admit: 2020-04-22 | Discharge: 2020-04-22 | Disposition: A | Discharge status: Home / Self Care | Payer: Medicaid Other | Source: Ambulatory Visit | Attending: Urology | Admitting: Urology

## 2020-04-22 ENCOUNTER — Ambulatory Visit (INDEPENDENT_AMBULATORY_CARE_PROVIDER_SITE_OTHER): Payer: Medicaid Other | Admitting: Urology

## 2020-04-22 ENCOUNTER — Other Ambulatory Visit: Payer: Self-pay

## 2020-04-22 ENCOUNTER — Encounter: Payer: Self-pay | Admitting: Urology

## 2020-04-22 VITALS — BP 117/70 | HR 88 | Ht 72.0 in | Wt 238.0 lb

## 2020-04-22 DIAGNOSIS — N200 Calculus of kidney: Secondary | ICD-10-CM

## 2020-04-22 DIAGNOSIS — Z125 Encounter for screening for malignant neoplasm of prostate: Secondary | ICD-10-CM | POA: Diagnosis not present

## 2020-04-22 MED ORDER — TAMSULOSIN HCL 0.4 MG PO CAPS: 0.4000 mg | ORAL_CAPSULE | Freq: Every day | ORAL | 3 refills | Status: DC

## 2020-04-23 LAB — URINALYSIS, COMPLETE
Bilirubin, UA: NEGATIVE
Glucose, UA: NEGATIVE
Ketones, UA: NEGATIVE
Leukocytes,UA: NEGATIVE
Nitrite, UA: NEGATIVE
Protein,UA: NEGATIVE
Specific Gravity, UA: 1.02 (ref 1.005–1.030)
Urobilinogen, Ur: 0.2 mg/dL (ref 0.2–1.0)
pH, UA: 7 (ref 5.0–7.5)

## 2020-04-23 LAB — MICROSCOPIC EXAMINATION: Bacteria, UA: NONE SEEN

## 2020-07-15 ENCOUNTER — Emergency Department: Payer: Medicaid Other

## 2020-07-15 ENCOUNTER — Emergency Department
Admission: EM | Admit: 2020-07-15 | Discharge: 2020-07-15 | Disposition: A | Discharge status: Home / Self Care | Payer: Medicaid Other | Attending: Student in an Organized Health Care Education/Training Program | Admitting: Student in an Organized Health Care Education/Training Program

## 2020-07-15 ENCOUNTER — Other Ambulatory Visit: Payer: Self-pay

## 2020-07-15 DIAGNOSIS — Z7951 Long term (current) use of inhaled steroids: Secondary | ICD-10-CM | POA: Insufficient documentation

## 2020-07-15 DIAGNOSIS — I10 Essential (primary) hypertension: Secondary | ICD-10-CM | POA: Diagnosis not present

## 2020-07-15 DIAGNOSIS — Z7982 Long term (current) use of aspirin: Secondary | ICD-10-CM | POA: Insufficient documentation

## 2020-07-15 DIAGNOSIS — Z79899 Other long term (current) drug therapy: Secondary | ICD-10-CM | POA: Insufficient documentation

## 2020-07-15 DIAGNOSIS — E860 Dehydration: Secondary | ICD-10-CM | POA: Diagnosis not present

## 2020-07-15 DIAGNOSIS — F17210 Nicotine dependence, cigarettes, uncomplicated: Secondary | ICD-10-CM | POA: Diagnosis not present

## 2020-07-15 DIAGNOSIS — J449 Chronic obstructive pulmonary disease, unspecified: Secondary | ICD-10-CM | POA: Insufficient documentation

## 2020-07-15 DIAGNOSIS — R42 Dizziness and giddiness: Secondary | ICD-10-CM

## 2020-07-15 LAB — COMPREHENSIVE METABOLIC PANEL
ALT: 24 U/L (ref 0–44)
AST: 22 U/L (ref 15–41)
Albumin: 4.4 g/dL (ref 3.5–5.0)
Alkaline Phosphatase: 54 U/L (ref 38–126)
Anion gap: 10 (ref 5–15)
BUN: 27 mg/dL — ABNORMAL HIGH (ref 8–23)
CO2: 23 mmol/L (ref 22–32)
Calcium: 9.3 mg/dL (ref 8.9–10.3)
Chloride: 104 mmol/L (ref 98–111)
Creatinine, Ser: 1.92 mg/dL — ABNORMAL HIGH (ref 0.61–1.24)
GFR, Estimated: 37 mL/min — ABNORMAL LOW (ref >60)
Glucose, Bld: 140 mg/dL — ABNORMAL HIGH (ref 70–99)
Potassium: 4.2 mmol/L (ref 3.5–5.1)
Sodium: 137 mmol/L (ref 135–145)
Total Bilirubin: 0.5 mg/dL (ref 0.3–1.2)
Total Protein: 7.5 g/dL (ref 6.5–8.1)

## 2020-07-15 LAB — DIFFERENTIAL
Abs Immature Granulocytes: 0.25 10*3/uL — ABNORMAL HIGH (ref 0.00–0.07)
Basophils Absolute: 0.1 10*3/uL (ref 0.0–0.1)
Basophils Relative: 1 %
Eosinophils Absolute: 0.2 10*3/uL (ref 0.0–0.5)
Eosinophils Relative: 1 %
Immature Granulocytes: 1 %
Lymphocytes Relative: 24 %
Lymphs Abs: 4.3 10*3/uL — ABNORMAL HIGH (ref 0.7–4.0)
Monocytes Absolute: 1.6 10*3/uL — ABNORMAL HIGH (ref 0.1–1.0)
Monocytes Relative: 9 %
Neutro Abs: 11.2 10*3/uL — ABNORMAL HIGH (ref 1.7–7.7)
Neutrophils Relative %: 64 %

## 2020-07-15 LAB — CBC
HCT: 39.6 % (ref 39.0–52.0)
Hemoglobin: 12.9 g/dL — ABNORMAL LOW (ref 13.0–17.0)
MCH: 28.2 pg (ref 26.0–34.0)
MCHC: 32.6 g/dL (ref 30.0–36.0)
MCV: 86.7 fL (ref 80.0–100.0)
Platelets: 404 10*3/uL — ABNORMAL HIGH (ref 150–400)
RBC: 4.57 MIL/uL (ref 4.22–5.81)
RDW: 13.7 % (ref 11.5–15.5)
WBC: 17.7 10*3/uL — ABNORMAL HIGH (ref 4.0–10.5)
nRBC: 0 % (ref 0.0–0.2)

## 2020-07-15 LAB — PROTIME-INR
INR: 1 (ref 0.8–1.2)
Prothrombin Time: 12.4 seconds (ref 11.4–15.2)

## 2020-07-15 LAB — APTT: aPTT: 28 seconds (ref 24–36)

## 2020-07-15 LAB — GLUCOSE, CAPILLARY: Glucose-Capillary: 148 mg/dL — ABNORMAL HIGH (ref 70–99)

## 2020-07-15 LAB — TROPONIN I (HIGH SENSITIVITY): Troponin I (High Sensitivity): 7 ng/L (ref <18)

## 2020-07-15 MED ORDER — SODIUM CHLORIDE 0.9% FLUSH
3.0000 mL | Freq: Once | INTRAVENOUS | Status: DC
Start: 2020-07-15 — End: 2020-07-16

## 2020-07-15 MED ORDER — SODIUM CHLORIDE 0.9 % IV BOLUS
1000.0000 mL | Freq: Once | INTRAVENOUS | Status: AC
  Administered 2020-07-15: 1000 mL via INTRAVENOUS

## 2020-07-15 NOTE — Discharge Instructions (Addendum)
Patient to drink plenty of fluids.  Please call your primary care doctor as well as neurologist have close follow-up and discuss your blood pressure medications as I suspect these may be contributing to your symptoms.  Is return if you have any worsening symptoms, and for any additional questions or concerns.

## 2020-07-15 NOTE — ED Notes (Signed)
PT in MRI.

## 2020-07-15 NOTE — ED Triage Notes (Signed)
Pt comes POV with "a head bruise" pt states "like a stroke had passed again" on 18-Jan-2023. Pt took 2 aspirins and went to sleep. Woke up and was better. Some minor symptoms yesterday. About 2 hours ago, pt started with dizziness and "not feeling right". Hx of strokes. Also endorses "heart cramps".

## 2020-07-15 NOTE — ED Provider Notes (Signed)
[facility] Emergency Department Provider Note    First MD Initiated Contact with Patient 07/15/20 1920     (approximate)  I have reviewed the triage vital signs and the nursing notes.   HISTORY  Chief Complaint Dizziness    HPI Mark Vang is a 59 y.o. male the below listed history presents to the ER for several days of having episodes of feeling like he is about to pass out.  States he has a history of stroke feels like he can feel it coming on and feels like he has to grab onto something because he feels like he is about to lose consciousness.  Also had an episode of chest discomfort yesterday.  Not currently complaining of any chest pain.  Denies any blurry vision.  Trouble finding words.  Denies any weakness.  No numbness or tingling.  Denies any headaches.  Denies any trauma.  He does still smoke.  Is compliant with his medications otherwise.    Past Medical History:  Diagnosis Date  . COPD (chronic obstructive pulmonary disease) ([place])   . Hypertension   . Stroke [facility])    Family History  Problem Relation Age of Onset  . Cancer Mother   . Cancer Father   . Bladder Cancer Neg Hx   . Kidney cancer Neg Hx   . Prostate cancer Neg Hx    Past Surgical History:  Procedure Laterality Date  . KNEE SURGERY    . NASAL SINUS SURGERY     Patient Active Problem List   Diagnosis Date Noted  . Subacromial bursitis of shoulder joint (Right) 02/27/2020  . Osteoarthritis of shoulder (Right) 02/10/2020  . Chronic shoulder pain (Right) 08/01/2018  . Hypotension 07/17/2018  . Headache disorder 06/13/2018  . Cervicalgia 06/07/2018  . Cervical facet hypertrophy (Bilateral) 06/07/2018  . Abnormal MRI, cervical spine (05/17/2018) 05/31/2018  . Neurogenic pain 05/30/2018  . Cervical facet syndrome (Bilateral) (L>R) 05/30/2018  . Spondylosis without myelopathy or radiculopathy, cervical region 05/30/2018  . Chronic musculoskeletal pain 05/10/2018  . Trigger  point of neck 05/10/2018  . Osteoarthritis of spine with radiculopathy, cervical region 05/09/2018  . Chronic nonintractable headache 05/03/2018  . Chronic hip pain [facility] Area of Pain) (Right) 04/30/2018  . Chronic neck pain (Primary Area of Pain) (Bilateral) (L>R) 04/30/2018  . DDD (degenerative disc disease), cervical 04/30/2018  . Muscle spasms of neck 04/30/2018  . Opiate use 04/30/2018  . Cervicogenic headache 04/30/2018  . Occipital headache (Bilateral) 04/30/2018  . Cervico-occipital neuralgia (Bilateral) 04/30/2018  . Lumbar facet syndrome (Bilateral) 04/30/2018  . Lumbar facet arthropathy (Bilateral) 04/30/2018  . DDD (degenerative disc disease), lumbosacral 04/30/2018  . Chronic sacroiliac joint pain (Left) 04/30/2018  . Current tear knee, medial meniscus 04/27/2018  . Osteoarthritis of knee 04/27/2018  . Cervical radiculopathy 04/12/2018  . Chronic lower extremity pain [facility] Area of Pain) (Bilateral) (R>L) 04/02/2018  . Chronic low back pain (Secondary Area of Pain) (Bilateral) (R>L) 04/02/2018  . Chronic pain syndrome 04/02/2018  . Long term current use of opiate analgesic 04/02/2018  . Pharmacologic therapy 04/02/2018  . Disorder of skeletal system 04/02/2018  . Problems influencing health status 04/02/2018  . Seizure ([place]) 07/01/2016  . Sinusitis 07/01/2016  . Stroke ([place]) 07/01/2016  . COPD (chronic obstructive pulmonary disease) ([place]) 01/11/2013  . Dyspnea on exertion 01/11/2013  . GERD (gastroesophageal reflux disease) 01/11/2013  . HTN (hypertension) 01/11/2013  . Seizures ([place]) 01/11/2013  . Tobacco abuse 01/11/2013  . Convulsions ([place]) 01/11/2013  .  Chronic sinusitis 12/13/2012  . 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 Authorizing Provider  amLODipine (NORVASC) 5 MG tablet Take 5 mg by mouth daily.    [provider]  aspirin 325 MG  EC tablet Take 325 mg by mouth daily.    [provider]  atorvastatin (LIPITOR) 40 MG tablet Take 40 mg by mouth daily.    [provider]  baclofen (LIORESAL) 10 MG tablet Take 1-2 tablets (10-20 mg total) by mouth 4 (four) times daily as needed for muscle spasms. 02/27/20 08/25/20  Milinda Pointer, MD  busPIRone (BUSPAR) 10 MG tablet Take 10 mg by mouth 3 (three) times daily.    [provider]  cholecalciferol (VITAMIN D) 1000 units tablet Take 50,000 Units by mouth once a week.     [provider]  cyclobenzaprine (FLEXERIL) 10 MG tablet Take 10 mg by mouth 3 (three) times daily as needed for muscle spasms.    [provider]  fenofibrate (TRICOR) 145 MG tablet Take 145 mg by mouth daily.    [provider]  fluticasone (FLONASE) 50 MCG/ACT nasal spray Place 1 spray into both nostrils daily.     [provider]  Fluticasone-Umeclidin-Vilant (TRELEGY ELLIPTA) 100-62.5-25 MCG/INH AEPB Inhale into the lungs.    [provider]  gabapentin (NEURONTIN) 300 MG capsule Take 1-3 capsules (300-900 mg total) by mouth 4 (four) times daily. 02/27/20 08/25/20  Milinda Pointer, MD  Galcanezumab-gnlm [facility]) 120 MG/ML SOAJ Inject 120 mg into the skin every 28 (twenty-eight) days.    [provider]  hydrochlorothiazide (HYDRODIURIL) 25 MG tablet Take 25 mg by mouth daily.    [provider]  lisinopril (PRINIVIL,ZESTRIL) 20 MG tablet Take 20 mg by mouth daily.    [provider]  meloxicam (MOBIC) 15 MG tablet Take 15 mg by mouth daily.    [provider]  Multiple Vitamin (MULTIVITAMIN) tablet Take 1 tablet by mouth daily.    [provider]  nortriptyline (PAMELOR) 10 MG capsule Take 10 mg by mouth at bedtime. Patient will titrate up to 20 mg after 7 days. 06/06/18   [provider]  pantoprazole (PROTONIX) 40 MG tablet Take 1 tablet by mouth daily. 04/10/18   [provider]  potassium citrate (UROCIT-K) 10 MEQ (1080 MG) SR tablet Take 10 mEq by mouth 3 (three) times daily with meals.    [provider]  Roflumilast (DALIRESP) 250 MCG TABS Take by mouth in the morning and at bedtime.    [provider]  roflumilast (DALIRESP) 500 MCG TABS tablet Take by mouth.    [provider]  tamsulosin (FLOMAX) 0.4 MG CAPS capsule Take 1 capsule (0.4 mg total) by mouth daily. 04/23/19   Zara Council A, PA-C  tamsulosin (FLOMAX) 0.4 MG CAPS capsule Take 1 capsule (0.4 mg total) by mouth daily. 04/22/20   Zara Council A, PA-C  Tiotropium Bromide-Olodaterol (STIOLTO RESPIMAT) 2.5-2.5 MCG/ACT AERS Inhale into the lungs.    [provider]    Allergies Amlodipine besy-benazepril hcl, Amlodipine, and Hydrochlorothiazide    Social History Social History   Tobacco Use  . Smoking status: Current Every Day Smoker    Packs/day: 1.00    Types: Cigarettes  . Smokeless tobacco: Never Used  . Tobacco comment: hx copd  Vaping Use  . Vaping Use: Never used  Substance Use Topics  .  Alcohol use: No  . Drug use: No    Review of Systems Patient denies headaches, rhinorrhea, blurry vision, numbness, shortness of breath, chest pain, edema, cough, abdominal pain, nausea, vomiting, diarrhea, dysuria, fevers, rashes or hallucinations unless otherwise stated above in HPI. ____________________________________________   PHYSICAL EXAM:  VITAL SIGNS: Vitals:   07/15/20 1510 07/15/20 1912  BP: 109/72 (!) 144/84  Pulse: (!) 104 93  Resp: 18 20  Temp: 98.4 F (36.9 C)   SpO2: 98% 98%    Constitutional: Alert and oriented.  Eyes: Conjunctivae are normal.  Head: Atraumatic. Nose: No congestion/rhinnorhea. Mouth/Throat: Mucous membranes are moist.   Neck: No stridor. Painless ROM.  Cardiovascular: Normal rate, regular rhythm. Grossly normal heart sounds.  Good peripheral circulation. Respiratory: Normal respiratory effort.  No  retractions. Lungs CTAB. Gastrointestinal: Soft and nontender. No distention. No abdominal bruits. No CVA tenderness. Genitourinary:  Musculoskeletal: No lower extremity tenderness nor edema.  No joint effusions. Neurologic:  CN- intact.  No facial droop, Normal FNF.  Normal heel to shin.  Sensation intact bilaterally. Normal speech and language. No gross focal neurologic deficits are appreciated. No gait instability. Skin:  Skin is warm, dry and intact. No rash noted. Psychiatric: Mood and affect are normal. Speech and behavior are normal.  ____________________________________________   LABS (all labs ordered are listed, but only abnormal results are displayed)  Results for orders placed or performed during the hospital encounter of 07/15/20 (from the past 24 hour(s))  Protime-INR     Status: None   Collection Time: 07/15/20  3:22 PM  Result Value Ref Range   Prothrombin Time 12.4 11.4 - 15.2 seconds   INR 1.0 0.8 - 1.2  APTT     Status: None   Collection Time: 07/15/20  3:22 PM  Result Value Ref Range   aPTT 28 24 - 36 seconds  CBC     Status: Abnormal   Collection Time: 07/15/20  3:22 PM  Result Value Ref Range   WBC 17.7 (H) 4.0 - 10.5 K/uL   RBC 4.57 4.22 - 5.81 MIL/uL   Hemoglobin 12.9 (L) 13.0 - 17.0 g/dL   HCT 39.6 39 - 52 %   MCV 86.7 80.0 - 100.0 fL   MCH 28.2 26.0 - 34.0 pg   MCHC 32.6 30.0 - 36.0 g/dL   RDW 13.7 11.5 - 15.5 %   Platelets 404 (H) 150 - 400 K/uL   nRBC 0.0 0.0 - 0.2 %  Differential     Status: Abnormal   Collection Time: 07/15/20  3:22 PM  Result Value Ref Range   Neutrophils Relative % 64 %   Neutro Abs 11.2 (H) 1.7 - 7.7 K/uL   Lymphocytes Relative 24 %   Lymphs Abs 4.3 (H) 0.7 - 4.0 K/uL   Monocytes Relative 9 %   Monocytes Absolute 1.6 (H) 0.1 - 1.0 K/uL   Eosinophils Relative 1 %   Eosinophils Absolute 0.2 0.0 - 0.5 K/uL   Basophils Relative 1 %   Basophils Absolute 0.1 0.0 - 0.1 K/uL   Immature Granulocytes 1 %   Abs Immature  Granulocytes 0.25 (H) 0.00 - 0.07 K/uL  Comprehensive metabolic panel     Status: Abnormal   Collection Time: 07/15/20  3:22 PM  Result Value Ref Range   Sodium 137 135 - 145 mmol/L   Potassium 4.2 3.5 - 5.1 mmol/L   Chloride 104 98 - 111 mmol/L   CO2 23 22 - 32 mmol/L   Glucose, Bld 140 (  H) 70 - 99 mg/dL   BUN 27 (H) 8 - 23 mg/dL   Creatinine, Ser 1.92 (H) 0.61 - 1.24 mg/dL   Calcium 9.3 8.9 - 10.3 mg/dL   Total Protein 7.5 6.5 - 8.1 g/dL   Albumin 4.4 3.5 - 5.0 g/dL   AST 22 15 - 41 U/L   ALT 24 0 - 44 U/L   Alkaline Phosphatase 54 38 - 126 U/L   Total Bilirubin 0.5 0.3 - 1.2 mg/dL   GFR, Estimated 37 (L) >60 mL/min   Anion gap 10 5 - 15  Glucose, capillary     Status: Abnormal   Collection Time: 07/15/20  3:22 PM  Result Value Ref Range   Glucose-Capillary 148 (H) 70 - 99 mg/dL  Troponin I (High Sensitivity)     Status: None   Collection Time: 07/15/20  7:51 PM  Result Value Ref Range   Troponin I (High Sensitivity) 7 <18 ng/L   ____________________________________________  EKG My review and personal interpretation at Time: 15:11   Indication: near syncope  Rate: 100  Rhythm: sinus Axis: normal Other: normal intervals, no stemi, nonspecific st abn ____________________________________________  RADIOLOGY  I personally reviewed all radiographic images ordered to evaluate for the above acute complaints and reviewed radiology reports and findings.  These findings were personally discussed with the patient.  Please see medical record for radiology report.  ____________________________________________   PROCEDURES  Procedure(s) performed:  Procedures    Critical Care performed: no ____________________________________________   INITIAL IMPRESSION / ASSESSMENT AND PLAN / ED COURSE  Pertinent labs & imaging results that were available during my care of the patient were reviewed by me and considered in my medical decision making (see chart for details).   DDX: cva,  tia, hypoglycemia, dehydration, electrolyte abnormality, dissection, sepsis   Quavon Keisling is a 59 y.o. who presents to the ED with presentation as described above.  Patient nontoxic-appearing afebrile hemodynamically stable.  Does not have any focal neuro deficits.  Patient was a poor historian but from I gather it seems like his symptoms are more likely better characterized by feeling like he is about to pass out.  His blood work does show some mild AKI therefore suspect a component of dehydration will order some IV fluids.  CT head does not show any acute abnormality but given his age and risk factors I do feel that further work-up with MRI to further characterize these spells and rule out signs of CVA is indicated.  EKG is nonischemic.  Given that episode of chest pain yesterday today will order troponin.  Clinical Course as of Jul 16 2255  Wed Jul 15, 2020  2251 Patient feels better after IV fluids.  Able to ambulate with steady gait.  No sign of stroke or acute abnormality on MRI.  No sign of ACS.  Patient does appear appropriate for outpatient follow-up.   [PR]    Clinical Course User Index [PR] Merlyn Lot, MD    The patient was evaluated in Emergency Department today for the symptoms described in the history of present illness. He/she was evaluated in the context of the global COVID-19 pandemic, which necessitated consideration that the patient might be at risk for infection with the SARS-CoV-2 virus that causes COVID-19. Institutional protocols and algorithms that pertain to the evaluation of patients at risk for COVID-19 are in a state of rapid change based on information released by regulatory bodies including the CDC and federal and state organizations. These policies and algorithms  were followed during the patient's care in the ED.  As part of my medical decision making, I reviewed the following data within the [facility] notes reviewed and  incorporated, Labs reviewed, notes from prior ED visits and  Controlled Substance Database   ____________________________________________   FINAL CLINICAL IMPRESSION(S) / ED DIAGNOSES  Final diagnoses:  Lightheadedness  Dehydration      NEW MEDICATIONS STARTED DURING THIS VISIT:  New Prescriptions   No medications on file     Note:  This document was prepared using Dragon voice recognition software and may include unintentional dictation errors.    Merlyn Lot, MD 07/15/20 2256

## 2020-07-15 NOTE — ED Notes (Signed)
Pr Dr. Tamala Julian, do not call code stroke at this time.

## 2020-07-15 NOTE — ED Notes (Signed)
PT given snacks with MD permission

## 2020-07-16 ENCOUNTER — Encounter: Payer: Medicaid Other | Admitting: Pain Medicine

## 2020-08-19 DIAGNOSIS — D75839 Thrombocytosis, unspecified: Secondary | ICD-10-CM | POA: Insufficient documentation

## 2020-08-19 NOTE — Progress Notes (Signed)
[place]  [phone]) [phone] [phone]) [phone]  ID: Mark Vang OB: [birth year]  MR#: [id number]  GEZ#:[id number]  Patient Care Team: Remi Haggard, FNP as PCP - General (Family Medicine)  CHIEF COMPLAINT: Thrombocytosis  INTERVAL HISTORY: Patient is a 59 year old male with a history of chronic neck pain who was noted to have a persistently elevated platelet count on routine blood work.  He currently feels well and at his baseline.  He has no neurologic complaints.  He denies any recent fevers or illnesses.  He has good appetite and denies weight loss.  He has no chest pain, shortness of breath, cough, or hemoptysis.  He denies any nausea, vomiting, constipation, or diarrhea.  He has no urinary complaints.  Patient offers no specific complaints today.  REVIEW OF SYSTEMS:   Review of Systems  Constitutional: Negative.  Negative for fever, malaise/fatigue and weight loss.  Respiratory: Negative.  Negative for cough, hemoptysis and shortness of breath.   Cardiovascular: Negative.  Negative for chest pain and leg swelling.  Gastrointestinal: Negative.  Negative for abdominal pain.  Genitourinary: Negative.   Musculoskeletal: Positive for neck pain.  Skin: Negative.  Negative for rash.  Neurological: Negative.  Negative for dizziness, focal weakness, weakness and headaches.  Psychiatric/Behavioral: Negative.  The patient is not nervous/anxious.     As per HPI. Otherwise, a complete review of systems is negative.  PAST MEDICAL HISTORY: Past Medical History:  Diagnosis Date  . COPD (chronic obstructive pulmonary disease) ([place])   . Hypertension   . Stroke [facility])     PAST SURGICAL HISTORY: Past Surgical History:  Procedure Laterality Date  . KNEE SURGERY    . NASAL SINUS SURGERY      FAMILY HISTORY: Family History  Problem Relation Age of Onset  . Cancer Mother   . Cancer Father   . Bladder Cancer Neg Hx   . Kidney cancer Neg Hx   . Prostate  cancer Neg Hx     ADVANCED DIRECTIVES (Y/N):  N  HEALTH MAINTENANCE: Social History   Tobacco Use  . Smoking status: Current Every Day Smoker    Packs/day: 1.00    Types: Cigarettes  . Smokeless tobacco: Never Used  . Tobacco comment: hx copd  Vaping Use  . Vaping Use: Never used  Substance Use Topics  . Alcohol use: No  . Drug use: No     Colonoscopy:  PAP:  Bone density:  Lipid panel:  Allergies  Allergen Reactions  . Amlodipine Besy-Benazepril Hcl Other (See Comments)    Scales, burning, itching, rash  . Amlodipine   . Hydrochlorothiazide     Current Outpatient Medications  Medication Sig Dispense Refill  . amLODipine (NORVASC) 5 MG tablet Take 5 mg by mouth daily.    Marland Kitchen aspirin 325 MG EC tablet Take 325 mg by mouth daily.    Marland Kitchen atorvastatin (LIPITOR) 40 MG tablet Take 40 mg by mouth daily.    . baclofen (LIORESAL) 10 MG tablet Take 1-2 tablets (10-20 mg total) by mouth 4 (four) times daily as needed for muscle spasms. 240 tablet 5  . busPIRone (BUSPAR) 10 MG tablet Take 10 mg by mouth 3 (three) times daily.    . cholecalciferol (VITAMIN D) 1000 units tablet Take 50,000 Units by mouth once a week.     . cyclobenzaprine (FLEXERIL) 10 MG tablet Take 10 mg by mouth 3 (three) times daily as needed for muscle spasms.    . fenofibrate (TRICOR) 145 MG tablet Take 145  mg by mouth daily.    . fluticasone (FLONASE) 50 MCG/ACT nasal spray Place 1 spray into both nostrils daily.     . Fluticasone-Umeclidin-Vilant (TRELEGY ELLIPTA) 100-62.5-25 MCG/INH AEPB Inhale into the lungs.    . gabapentin (NEURONTIN) 300 MG capsule Take 1-3 capsules (300-900 mg total) by mouth 4 (four) times daily. 360 capsule 5  . Galcanezumab-gnlm (EMGALITY) 120 MG/ML SOAJ Inject 120 mg into the skin every 28 (twenty-eight) days.    Marland Kitchen lisinopril (PRINIVIL,ZESTRIL) 20 MG tablet Take 20 mg by mouth daily.    . Multiple Vitamin (MULTIVITAMIN) tablet Take 1 tablet by mouth daily.    . pantoprazole (PROTONIX)  40 MG tablet Take 1 tablet by mouth daily.  5  . potassium citrate (UROCIT-K) 10 MEQ (1080 MG) SR tablet Take 10 mEq by mouth 3 (three) times daily with meals.    . tamsulosin (FLOMAX) 0.4 MG CAPS capsule Take 1 capsule (0.4 mg total) by mouth daily. 90 capsule 3  . tamsulosin (FLOMAX) 0.4 MG CAPS capsule Take 1 capsule (0.4 mg total) by mouth daily. 90 capsule 3  . Tiotropium Bromide-Olodaterol (STIOLTO RESPIMAT) 2.5-2.5 MCG/ACT AERS Inhale into the lungs.    . hydrochlorothiazide (HYDRODIURIL) 25 MG tablet Take 25 mg by mouth daily. (Patient not taking: Reported on 08/27/2020)    . meloxicam (MOBIC) 15 MG tablet Take 15 mg by mouth daily. (Patient not taking: Reported on 08/27/2020)    . nortriptyline (PAMELOR) 10 MG capsule Take 10 mg by mouth at bedtime. Patient will titrate up to 20 mg after 7 days. (Patient not taking: Reported on 08/27/2020)  3  . Roflumilast (DALIRESP) 250 MCG TABS Take by mouth in the morning and at bedtime. (Patient not taking: Reported on 08/27/2020)    . roflumilast (DALIRESP) 500 MCG TABS tablet Take by mouth. (Patient not taking: Reported on 08/27/2020)     No current facility-administered medications for this visit.    OBJECTIVE: Vitals:   08/27/20 1325  BP: 128/83  Pulse: 78  Temp: 98.3 F (36.8 C)  SpO2: 98%     Body mass index is 32.45 kg/m.    ECOG FS:0 - Asymptomatic  General: Well-developed, well-nourished, no acute distress. Eyes: Pink conjunctiva, anicteric sclera. HEENT: Normocephalic, moist mucous membranes. Lungs: No audible wheezing or coughing. Heart: Regular rate and rhythm. Abdomen: Soft, nontender, no obvious distention. Musculoskeletal: No edema, cyanosis, or clubbing. Neuro: Alert, answering all questions appropriately. Cranial nerves grossly intact. Skin: No rashes or petechiae noted. Psych: Normal affect. Lymphatics: No cervical, calvicular, axillary or inguinal LAD.   LAB RESULTS:  Lab Results  Component Value Date   NA 137  07/15/2020   K 4.2 07/15/2020   CL 104 07/15/2020   CO2 23 07/15/2020   GLUCOSE 140 (H) 07/15/2020   BUN 27 (H) 07/15/2020   CREATININE 1.92 (H) 07/15/2020   CALCIUM 9.3 07/15/2020   PROT 7.5 07/15/2020   ALBUMIN 4.4 07/15/2020   AST 22 07/15/2020   ALT 24 07/15/2020   ALKPHOS 54 07/15/2020   BILITOT 0.5 07/15/2020   GFRNONAA 37 (L) 07/15/2020   GFRAA 53 (L) 04/06/2020    Lab Results  Component Value Date   WBC 14.4 (H) 08/27/2020   NEUTROABS 7.7 08/27/2020   HGB 14.0 08/27/2020   HCT 43.3 08/27/2020   MCV 86.3 08/27/2020   PLT 376 08/27/2020     STUDIES: No results found.  ASSESSMENT: Thrombocytosis.  PLAN:    1. Thrombocytosis: Resolved.  Patient platelet count is now within  normal limits at 376.  Suspect his transient elevation was reactive.  His iron stores, B12, and folate are all within normal limits.  JAK2 mutation was ordered for completeness and is pending at time of dictation.  No intervention is needed at this time.  Patient will have video assisted telemedicine visit on September 22, 2020 for further evaluation and discussion of his laboratory results. 2.  Leukocytosis: Patient noted to have a white blood cell count of 14.4 with a lymphocyte predominance.  Can consider ordering peripheral flow blood cytometry in the future. 3.  Neck pain: Continue follow-up with pain clinic as scheduled.  I spent a total of 45 minutes reviewing chart data, face-to-face evaluation with the patient, counseling and coordination of care as detailed above.   Patient expressed understanding and was in agreement with this plan. He also understands that He can call clinic at any time with any questions, concerns, or complaints.    Lloyd Huger, MD   08/28/2020 10:12 AM

## 2020-08-27 ENCOUNTER — Inpatient Hospital Stay: Payer: Medicaid Other

## 2020-08-27 ENCOUNTER — Other Ambulatory Visit: Payer: Self-pay

## 2020-08-27 ENCOUNTER — Inpatient Hospital Stay: Payer: Medicaid Other | Attending: Oncology | Admitting: Oncology

## 2020-08-27 ENCOUNTER — Encounter: Payer: Self-pay | Admitting: Oncology

## 2020-08-27 DIAGNOSIS — D72829 Elevated white blood cell count, unspecified: Secondary | ICD-10-CM | POA: Insufficient documentation

## 2020-08-27 DIAGNOSIS — D75839 Thrombocytosis, unspecified: Secondary | ICD-10-CM

## 2020-08-27 DIAGNOSIS — M542 Cervicalgia: Secondary | ICD-10-CM | POA: Diagnosis not present

## 2020-08-27 DIAGNOSIS — Z79899 Other long term (current) drug therapy: Secondary | ICD-10-CM | POA: Insufficient documentation

## 2020-08-27 LAB — FERRITIN: Ferritin: 57 ng/mL (ref 24–336)

## 2020-08-27 LAB — CBC WITH DIFFERENTIAL/PLATELET
Abs Immature Granulocytes: 0.13 K/uL — ABNORMAL HIGH (ref 0.00–0.07)
Basophils Absolute: 0.1 K/uL (ref 0.0–0.1)
Basophils Relative: 1 %
Eosinophils Absolute: 0.3 K/uL (ref 0.0–0.5)
Eosinophils Relative: 2 %
HCT: 43.3 % (ref 39.0–52.0)
Hemoglobin: 14 g/dL (ref 13.0–17.0)
Immature Granulocytes: 1 %
Lymphocytes Relative: 35 %
Lymphs Abs: 5 K/uL — ABNORMAL HIGH (ref 0.7–4.0)
MCH: 27.9 pg (ref 26.0–34.0)
MCHC: 32.3 g/dL (ref 30.0–36.0)
MCV: 86.3 fL (ref 80.0–100.0)
Monocytes Absolute: 1.3 K/uL — ABNORMAL HIGH (ref 0.1–1.0)
Monocytes Relative: 9 %
Neutro Abs: 7.7 K/uL (ref 1.7–7.7)
Neutrophils Relative %: 52 %
Platelets: 376 K/uL (ref 150–400)
RBC: 5.02 MIL/uL (ref 4.22–5.81)
RDW: 14.1 % (ref 11.5–15.5)
WBC: 14.4 K/uL — ABNORMAL HIGH (ref 4.0–10.5)
nRBC: 0 % (ref 0.0–0.2)

## 2020-08-27 LAB — IRON AND TIBC
Iron: 64 ug/dL (ref 45–182)
Saturation Ratios: 15 % — ABNORMAL LOW (ref 17.9–39.5)
TIBC: 403 ug/dL (ref 250–450)
UIBC: 341 ug/dL

## 2020-08-27 LAB — VITAMIN B12: Vitamin B-12: 234 pg/mL (ref 180–914)

## 2020-08-27 LAB — FOLATE: Folate: 22.6 ng/mL (ref >5.9)

## 2020-09-07 LAB — JAK2  V617F QUAL. WITH REFLEX TO EXON 12: Reflex:: 15

## 2020-09-07 LAB — JAK2 EXONS 12-15

## 2020-09-20 NOTE — Progress Notes (Signed)
[place]  Telephone:(336) [phone] Fax:(336) [phone]  ID: Mark Vang OB: [birth year]  MR#: [id number]  BZJ#:[id number]  Patient Care Team: Remi Haggard, FNP as PCP - General (Family Medicine)  I connected with [name] on 09/22/20 at  2:45 PM EST by video enabled telemedicine visit and verified that I am speaking with the correct person using two identifiers.   I discussed the limitations, risks, security and privacy concerns of performing an evaluation and management service by telemedicine and the availability of in-person appointments. I also discussed with the patient that there may be a patient responsible charge related to this service. The patient expressed understanding and agreed to proceed.   Other persons participating in the visit and their role in the encounter: Patient, MD.  Patient's location: Home. Provider's location: Clinic.  CHIEF COMPLAINT: Thrombocytosis, leukocytosis.  INTERVAL HISTORY: Patient agreed to video assisted telemedicine visit for further evaluation and discussion of his laboratory results.  He continues to feel well and is asymptomatic. He has no neurologic complaints.  He denies any recent fevers or illnesses.  He has a good appetite and denies weight loss.  He has no chest pain, shortness of breath, cough, or hemoptysis.  He denies any nausea, vomiting, constipation, or diarrhea.  He has no urinary complaints.  Patient feels at his baseline offers no specific complaints today.  REVIEW OF SYSTEMS:   Review of Systems  Constitutional: Negative.  Negative for fever, malaise/fatigue and weight loss.  Respiratory: Negative.  Negative for cough, hemoptysis and shortness of breath.   Cardiovascular: Negative.  Negative for chest pain and leg swelling.  Gastrointestinal: Negative.  Negative for abdominal pain.  Genitourinary: Negative.   Musculoskeletal: Positive for neck pain.  Skin: Negative.  Negative for rash.   Neurological: Negative.  Negative for dizziness, focal weakness, weakness and headaches.  Psychiatric/Behavioral: Negative.  The patient is not nervous/anxious.     As per HPI. Otherwise, a complete review of systems is negative.  PAST MEDICAL HISTORY: Past Medical History:  Diagnosis Date  . COPD (chronic obstructive pulmonary disease) ([place])   . Hypertension   . Stroke [facility])     PAST SURGICAL HISTORY: Past Surgical History:  Procedure Laterality Date  . KNEE SURGERY    . NASAL SINUS SURGERY      FAMILY HISTORY: Family History  Problem Relation Age of Onset  . Cancer Mother   . Cancer Father   . Bladder Cancer Neg Hx   . Kidney cancer Neg Hx   . Prostate cancer Neg Hx     ADVANCED DIRECTIVES (Y/N):  N  HEALTH MAINTENANCE: Social History   Tobacco Use  . Smoking status: Current Every Day Smoker    Packs/day: 1.00    Types: Cigarettes  . Smokeless tobacco: Never Used  . Tobacco comment: hx copd  Vaping Use  . Vaping Use: Never used  Substance Use Topics  . Alcohol use: No  . Drug use: No     Colonoscopy:  PAP:  Bone density:  Lipid panel:  Allergies  Allergen Reactions  . Amlodipine Besy-Benazepril Hcl Other (See Comments)    Scales, burning, itching, rash  . Amlodipine   . Hydrochlorothiazide     Current Outpatient Medications  Medication Sig Dispense Refill  . aspirin 325 MG EC tablet Take 325 mg by mouth daily.    Marland Kitchen atorvastatin (LIPITOR) 40 MG tablet Take 40 mg by mouth daily.    . cholecalciferol (VITAMIN D) 1000 units tablet Take 50,000  Units by mouth once a week.     . cyclobenzaprine (FLEXERIL) 10 MG tablet Take 10 mg by mouth 3 (three) times daily as needed for muscle spasms.    . fenofibrate (TRICOR) 145 MG tablet Take 145 mg by mouth daily.    . fluticasone (FLONASE) 50 MCG/ACT nasal spray Place 1 spray into both nostrils daily.     . hydrochlorothiazide (HYDRODIURIL) 25 MG tablet Take 25 mg by mouth daily.    Marland Kitchen lisinopril  (PRINIVIL,ZESTRIL) 20 MG tablet Take 20 mg by mouth daily.    . Multiple Vitamin (MULTIVITAMIN) tablet Take 1 tablet by mouth daily.    . pantoprazole (PROTONIX) 40 MG tablet Take 1 tablet by mouth daily.  5  . potassium citrate (UROCIT-K) 10 MEQ (1080 MG) SR tablet Take 10 mEq by mouth 3 (three) times daily with meals.    . tamsulosin (FLOMAX) 0.4 MG CAPS capsule Take 1 capsule (0.4 mg total) by mouth daily. 90 capsule 3  . Tiotropium Bromide-Olodaterol (STIOLTO RESPIMAT) 2.5-2.5 MCG/ACT AERS Inhale into the lungs.    Marland Kitchen amLODipine (NORVASC) 5 MG tablet Take 5 mg by mouth daily. (Patient not taking: Reported on 09/22/2020)    . baclofen (LIORESAL) 10 MG tablet Take 1-2 tablets (10-20 mg total) by mouth 4 (four) times daily as needed for muscle spasms. 240 tablet 5  . busPIRone (BUSPAR) 10 MG tablet Take 10 mg by mouth 3 (three) times daily.    . Fluticasone-Umeclidin-Vilant 100-62.5-25 MCG/INH AEPB Inhale into the lungs. (Patient not taking: Reported on 09/22/2020)    . gabapentin (NEURONTIN) 300 MG capsule Take 1-3 capsules (300-900 mg total) by mouth 4 (four) times daily. 360 capsule 5  . Galcanezumab-gnlm (EMGALITY) 120 MG/ML SOAJ Inject 120 mg into the skin every 28 (twenty-eight) days.    . meloxicam (MOBIC) 15 MG tablet Take 15 mg by mouth daily. (Patient not taking: No sig reported)    . nortriptyline (PAMELOR) 10 MG capsule Take 10 mg by mouth at bedtime. Patient will titrate up to 20 mg after 7 days. (Patient not taking: No sig reported)  3  . Roflumilast (DALIRESP) 250 MCG TABS Take by mouth in the morning and at bedtime. (Patient not taking: No sig reported)    . roflumilast (DALIRESP) 500 MCG TABS tablet Take by mouth. (Patient not taking: No sig reported)    . tamsulosin (FLOMAX) 0.4 MG CAPS capsule Take 1 capsule (0.4 mg total) by mouth daily. (Patient not taking: Reported on 09/22/2020) 90 capsule 3   No current facility-administered medications for this visit.     OBJECTIVE: There were no vitals filed for this visit.   There is no height or weight on file to calculate BMI.    ECOG FS:0 - Asymptomatic  General: Well-developed, well-nourished, no acute distress. HEENT: Normocephalic. Neuro: Alert, answering all questions appropriately. Cranial nerves grossly intact. Psych: Normal affect.  LAB RESULTS:  Lab Results  Component Value Date   NA 137 07/15/2020   K 4.2 07/15/2020   CL 104 07/15/2020   CO2 23 07/15/2020   GLUCOSE 140 (H) 07/15/2020   BUN 27 (H) 07/15/2020   CREATININE 1.92 (H) 07/15/2020   CALCIUM 9.3 07/15/2020   PROT 7.5 07/15/2020   ALBUMIN 4.4 07/15/2020   AST 22 07/15/2020   ALT 24 07/15/2020   ALKPHOS 54 07/15/2020   BILITOT 0.5 07/15/2020   GFRNONAA 37 (L) 07/15/2020   GFRAA 53 (L) 04/06/2020    Lab Results  Component Value Date  WBC 14.4 (H) 08/27/2020   NEUTROABS 7.7 08/27/2020   HGB 14.0 08/27/2020   HCT 43.3 08/27/2020   MCV 86.3 08/27/2020   PLT 376 08/27/2020     STUDIES: No results found.  ASSESSMENT: Thrombocytosis.  PLAN:    1. Thrombocytosis: Resolved.  Patient's most recent platelet count is within normal limits at 376.  Suspect his transient elevation was reactive.  Iron stores, B12, folate, and JAK2 mutation are all either negative or within normal limits.  No further interventions are needed.  2.  Leukocytosis: Patient noted to have a white blood cell count of 14.4 with a lymphocyte predominance.  Upon review of his chart, patient has had persistently elevated white blood cell count ranging from 13.2-26.5 since at least August 2013 where his white blood cell count was 17.3.  Will order peripheral blood flow cytometry with next lab result.  Return to clinic in 4 months with laboratory work and video assisted telemedicine visit. 3.  Neck pain: Continue follow-up with pain clinic as scheduled.   I provided 20 minutes of face-to-face video visit time during this encounter which included chart  review, counseling, and coordination of care as documented above.    Patient expressed understanding and was in agreement with this plan. He also understands that He can call clinic at any time with any questions, concerns, or complaints.    Lloyd Huger, MD   09/22/2020 2:58 PM

## 2020-09-22 ENCOUNTER — Inpatient Hospital Stay (HOSPITAL_BASED_OUTPATIENT_CLINIC_OR_DEPARTMENT_OTHER): Payer: Medicaid Other | Admitting: Oncology

## 2020-09-22 DIAGNOSIS — D75839 Thrombocytosis, unspecified: Secondary | ICD-10-CM

## 2021-01-14 ENCOUNTER — Inpatient Hospital Stay: Payer: Medicaid Other | Attending: Oncology

## 2021-01-14 DIAGNOSIS — D75839 Thrombocytosis, unspecified: Secondary | ICD-10-CM | POA: Insufficient documentation

## 2021-01-14 DIAGNOSIS — D72829 Elevated white blood cell count, unspecified: Secondary | ICD-10-CM | POA: Diagnosis not present

## 2021-01-14 DIAGNOSIS — M542 Cervicalgia: Secondary | ICD-10-CM | POA: Insufficient documentation

## 2021-01-14 DIAGNOSIS — Z79899 Other long term (current) drug therapy: Secondary | ICD-10-CM | POA: Diagnosis not present

## 2021-01-14 LAB — CBC WITH DIFFERENTIAL/PLATELET
Abs Immature Granulocytes: 0.1 10*3/uL — ABNORMAL HIGH (ref 0.00–0.07)
Basophils Absolute: 0.1 10*3/uL (ref 0.0–0.1)
Basophils Relative: 1 %
Eosinophils Absolute: 0.4 10*3/uL (ref 0.0–0.5)
Eosinophils Relative: 3 %
HCT: 45.1 % (ref 39.0–52.0)
Hemoglobin: 14.5 g/dL (ref 13.0–17.0)
Immature Granulocytes: 1 %
Lymphocytes Relative: 39 %
Lymphs Abs: 5.3 10*3/uL — ABNORMAL HIGH (ref 0.7–4.0)
MCH: 27.3 pg (ref 26.0–34.0)
MCHC: 32.2 g/dL (ref 30.0–36.0)
MCV: 84.8 fL (ref 80.0–100.0)
Monocytes Absolute: 1.3 10*3/uL — ABNORMAL HIGH (ref 0.1–1.0)
Monocytes Relative: 10 %
Neutro Abs: 6.2 10*3/uL (ref 1.7–7.7)
Neutrophils Relative %: 46 %
Platelets: 306 10*3/uL (ref 150–400)
RBC: 5.32 MIL/uL (ref 4.22–5.81)
RDW: 14.4 % (ref 11.5–15.5)
WBC: 13.3 10*3/uL — ABNORMAL HIGH (ref 4.0–10.5)
nRBC: 0 % (ref 0.0–0.2)

## 2021-01-17 NOTE — Progress Notes (Signed)
[place]  Telephone:(336) [phone] Fax:(336) [phone]  ID: Mark Vang OB: [birth year]  MR#: [id number]  ZMO#:[id number]  Patient Care Team: Remi Haggard, FNP as PCP - General (Family Medicine)  I connected with Mark Vang on 01/22/21 at  2:30 PM EDT by video enabled telemedicine visit and verified that I am speaking with the correct person using two identifiers.   I discussed the limitations, risks, security and privacy concerns of performing an evaluation and management service by telemedicine and the availability of in-person appointments. I also discussed with the patient that there may be a patient responsible charge related to this service. The patient expressed understanding and agreed to proceed.   Other persons participating in the visit and their role in the encounter: Patient, MD.  Patient's location: Home. Provider's location: Clinic.   CHIEF COMPLAINT: Thrombocytosis, leukocytosis.  INTERVAL HISTORY: Patient agreed to video assisted telemedicine visit for further evaluation and discussion of his laboratory results.  He continues to feel well and remains asymptomatic.  He has no neurologic complaints.  He denies any recent fevers or illnesses.  He has a good appetite and denies weight loss.  He has no chest pain, shortness of breath, cough, or hemoptysis.  He denies any nausea, vomiting, constipation, or diarrhea.  He has no urinary complaints.  Patient offers no specific complaints today.  REVIEW OF SYSTEMS:   Review of Systems  Constitutional: Negative.  Negative for fever, malaise/fatigue and weight loss.  Respiratory: Negative.  Negative for cough, hemoptysis and shortness of breath.   Cardiovascular: Negative.  Negative for chest pain and leg swelling.  Gastrointestinal: Negative.  Negative for abdominal pain.  Genitourinary: Negative.   Musculoskeletal: Negative.  Negative for neck pain.  Skin: Negative.  Negative for rash.   Neurological: Negative.  Negative for dizziness, focal weakness, weakness and headaches.  Psychiatric/Behavioral: Negative.  The patient is not nervous/anxious.     As per HPI. Otherwise, a complete review of systems is negative.  PAST MEDICAL HISTORY: Past Medical History:  Diagnosis Date  . COPD (chronic obstructive pulmonary disease) ([place])   . Hypertension   . Stroke [facility])     PAST SURGICAL HISTORY: Past Surgical History:  Procedure Laterality Date  . KNEE SURGERY    . NASAL SINUS SURGERY      FAMILY HISTORY: Family History  Problem Relation Age of Onset  . Cancer Mother   . Cancer Father   . Bladder Cancer Neg Hx   . Kidney cancer Neg Hx   . Prostate cancer Neg Hx     ADVANCED DIRECTIVES (Y/N):  N  HEALTH MAINTENANCE: Social History   Tobacco Use  . Smoking status: Current Every Day Smoker    Packs/day: 1.00    Types: Cigarettes  . Smokeless tobacco: Never Used  . Tobacco comment: hx copd  Vaping Use  . Vaping Use: Never used  Substance Use Topics  . Alcohol use: No  . Drug use: No     Colonoscopy:  PAP:  Bone density:  Lipid panel:  Allergies  Allergen Reactions  . Amlodipine Besy-Benazepril Hcl Other (See Comments)    Scales, burning, itching, rash  . Amlodipine   . Hydrochlorothiazide     Current Outpatient Medications  Medication Sig Dispense Refill  . aspirin 325 MG EC tablet Take 325 mg by mouth daily.    Marland Kitchen atorvastatin (LIPITOR) 40 MG tablet Take 40 mg by mouth daily.    . busPIRone (BUSPAR) 10 MG tablet Take 10 mg  by mouth 3 (three) times daily.    . cholecalciferol (VITAMIN D) 1000 units tablet Take 50,000 Units by mouth once a week.     . cyclobenzaprine (FLEXERIL) 10 MG tablet Take 10 mg by mouth 3 (three) times daily as needed for muscle spasms.    . fenofibrate (TRICOR) 145 MG tablet Take 145 mg by mouth daily.    . fluticasone (FLONASE) 50 MCG/ACT nasal spray Place 1 spray into both nostrils daily.     . Galcanezumab-gnlm  (EMGALITY) 120 MG/ML SOAJ Inject 120 mg into the skin every 28 (twenty-eight) days.    . hydrochlorothiazide (HYDRODIURIL) 25 MG tablet Take 25 mg by mouth daily.    Marland Kitchen lisinopril (PRINIVIL,ZESTRIL) 20 MG tablet Take 20 mg by mouth daily.    . Multiple Vitamin (MULTIVITAMIN) tablet Take 1 tablet by mouth daily.    . pantoprazole (PROTONIX) 40 MG tablet Take 1 tablet by mouth daily.  5  . potassium citrate (UROCIT-K) 10 MEQ (1080 MG) SR tablet Take 10 mEq by mouth 3 (three) times daily with meals.    . tamsulosin (FLOMAX) 0.4 MG CAPS capsule Take 1 capsule (0.4 mg total) by mouth daily. 90 capsule 3  . Tiotropium Bromide-Olodaterol (STIOLTO RESPIMAT) 2.5-2.5 MCG/ACT AERS Inhale into the lungs.    Marland Kitchen amLODipine (NORVASC) 5 MG tablet Take 5 mg by mouth daily. (Patient not taking: Reported on 09/22/2020)    . baclofen (LIORESAL) 10 MG tablet Take 1-2 tablets (10-20 mg total) by mouth 4 (four) times daily as needed for muscle spasms. 240 tablet 5  . Fluticasone-Umeclidin-Vilant 100-62.5-25 MCG/INH AEPB Inhale into the lungs. (Patient not taking: Reported on 09/22/2020)    . gabapentin (NEURONTIN) 300 MG capsule Take 1-3 capsules (300-900 mg total) by mouth 4 (four) times daily. 360 capsule 5  . Roflumilast (DALIRESP) 250 MCG TABS Take by mouth in the morning and at bedtime. (Patient not taking: No sig reported)    . roflumilast (DALIRESP) 500 MCG TABS tablet Take by mouth. (Patient not taking: No sig reported)     No current facility-administered medications for this visit.    OBJECTIVE: Vitals:   01/19/21 1135  BP: 111/79     Body mass index is 32.82 kg/m.    ECOG FS:0 - Asymptomatic  General: Well-developed, well-nourished, no acute distress. HEENT: Normocephalic. Neuro: Alert, answering all questions appropriately. Cranial nerves grossly intact. Psych: Normal affect.  LAB RESULTS:  Lab Results  Component Value Date   NA 137 07/15/2020   K 4.2 07/15/2020   CL 104 07/15/2020   CO2 23  07/15/2020   GLUCOSE 140 (H) 07/15/2020   BUN 27 (H) 07/15/2020   CREATININE 1.92 (H) 07/15/2020   CALCIUM 9.3 07/15/2020   PROT 7.5 07/15/2020   ALBUMIN 4.4 07/15/2020   AST 22 07/15/2020   ALT 24 07/15/2020   ALKPHOS 54 07/15/2020   BILITOT 0.5 07/15/2020   GFRNONAA 37 (L) 07/15/2020   GFRAA 53 (L) 04/06/2020    Lab Results  Component Value Date   WBC 13.3 (H) 01/14/2021   NEUTROABS 6.2 01/14/2021   HGB 14.5 01/14/2021   HCT 45.1 01/14/2021   MCV 84.8 01/14/2021   PLT 306 01/14/2021     STUDIES: No results found.  ASSESSMENT: Thrombocytosis.  PLAN:    1. Thrombocytosis: Resolved.  Patient's platelet count continues to be within normal limits.  Suspect his transient elevation previously was reactive.  Iron stores, B12, folate, and JAK2 mutation are all either negative or within normal  limits.  No further interventions are needed.  2.  Leukocytosis: Patient's white blood cell count remains persistently elevated at 13.3.  Upon review of his chart, patient's white blood cell count has ranged from 13.2-26.5 since at least August 2013 where his white blood cell count was 17.3.  Peripheral blood flow cytometry is negative.  No intervention is needed at this time.  Patient does not require bone marrow biopsy.  After lengthy discussion with the patient, is agreed upon that no further follow-up is necessary please refer patient back if there are any questions or concerns.   3.  Neck pain: Chronic and unchanged.  Continue follow-up with pain clinic as scheduled.   I provided 20 minutes of face-to-face video visit time during this encounter which included chart review, counseling, and coordination of care as documented above.   Patient expressed understanding and was in agreement with this plan. He also understands that He can call clinic at any time with any questions, concerns, or complaints.    Lloyd Huger, MD   01/22/2021 6:46 AM

## 2021-01-18 LAB — COMP PANEL: LEUKEMIA/LYMPHOMA

## 2021-01-21 ENCOUNTER — Other Ambulatory Visit: Payer: Self-pay

## 2021-01-21 ENCOUNTER — Encounter: Payer: Self-pay | Admitting: Oncology

## 2021-01-21 ENCOUNTER — Inpatient Hospital Stay (HOSPITAL_BASED_OUTPATIENT_CLINIC_OR_DEPARTMENT_OTHER): Payer: Medicaid Other | Admitting: Oncology

## 2021-01-21 VITALS — BP 111/79 | Wt 242.0 lb

## 2021-01-21 DIAGNOSIS — D75839 Thrombocytosis, unspecified: Secondary | ICD-10-CM

## 2021-02-04 ENCOUNTER — Other Ambulatory Visit: Payer: Self-pay | Admitting: Urology

## 2021-03-29 ENCOUNTER — Emergency Department
Admission: EM | Admit: 2021-03-29 | Discharge: 2021-03-29 | Disposition: A | Discharge status: Home / Self Care | Payer: Medicaid Other | Attending: Emergency Medicine | Admitting: Emergency Medicine

## 2021-03-29 ENCOUNTER — Emergency Department: Payer: Medicaid Other

## 2021-03-29 ENCOUNTER — Other Ambulatory Visit: Payer: Self-pay

## 2021-03-29 DIAGNOSIS — R42 Dizziness and giddiness: Secondary | ICD-10-CM | POA: Diagnosis present

## 2021-03-29 DIAGNOSIS — J449 Chronic obstructive pulmonary disease, unspecified: Secondary | ICD-10-CM | POA: Diagnosis not present

## 2021-03-29 DIAGNOSIS — Z79899 Other long term (current) drug therapy: Secondary | ICD-10-CM | POA: Insufficient documentation

## 2021-03-29 DIAGNOSIS — I10 Essential (primary) hypertension: Secondary | ICD-10-CM | POA: Insufficient documentation

## 2021-03-29 DIAGNOSIS — E860 Dehydration: Secondary | ICD-10-CM | POA: Diagnosis not present

## 2021-03-29 DIAGNOSIS — F17210 Nicotine dependence, cigarettes, uncomplicated: Secondary | ICD-10-CM | POA: Insufficient documentation

## 2021-03-29 DIAGNOSIS — Z7951 Long term (current) use of inhaled steroids: Secondary | ICD-10-CM | POA: Insufficient documentation

## 2021-03-29 DIAGNOSIS — Z8603 Personal history of neoplasm of uncertain behavior: Secondary | ICD-10-CM | POA: Diagnosis not present

## 2021-03-29 DIAGNOSIS — Z7982 Long term (current) use of aspirin: Secondary | ICD-10-CM | POA: Diagnosis not present

## 2021-03-29 LAB — URINALYSIS, COMPLETE (UACMP) WITH MICROSCOPIC
Bacteria, UA: NONE SEEN
Bilirubin Urine: NEGATIVE
Glucose, UA: NEGATIVE mg/dL
Hgb urine dipstick: NEGATIVE
Ketones, ur: NEGATIVE mg/dL
Leukocytes,Ua: NEGATIVE
Nitrite: NEGATIVE
Protein, ur: 30 mg/dL — AB
Specific Gravity, Urine: 1.008 (ref 1.005–1.030)
pH: 6 (ref 5.0–8.0)

## 2021-03-29 LAB — CBC
HCT: 42.5 % (ref 39.0–52.0)
Hemoglobin: 14.1 g/dL (ref 13.0–17.0)
MCH: 28.8 pg (ref 26.0–34.0)
MCHC: 33.2 g/dL (ref 30.0–36.0)
MCV: 86.7 fL (ref 80.0–100.0)
Platelets: 341 10*3/uL (ref 150–400)
RBC: 4.9 MIL/uL (ref 4.22–5.81)
RDW: 15 % (ref 11.5–15.5)
WBC: 17.7 10*3/uL — ABNORMAL HIGH (ref 4.0–10.5)
nRBC: 0 % (ref 0.0–0.2)

## 2021-03-29 LAB — BASIC METABOLIC PANEL
Anion gap: 12 (ref 5–15)
BUN: 20 mg/dL (ref 8–23)
CO2: 19 mmol/L — ABNORMAL LOW (ref 22–32)
Calcium: 9.5 mg/dL (ref 8.9–10.3)
Chloride: 105 mmol/L (ref 98–111)
Creatinine, Ser: 1.78 mg/dL — ABNORMAL HIGH (ref 0.61–1.24)
GFR, Estimated: 43 mL/min — ABNORMAL LOW (ref >60)
Glucose, Bld: 108 mg/dL — ABNORMAL HIGH (ref 70–99)
Potassium: 3.6 mmol/L (ref 3.5–5.1)
Sodium: 136 mmol/L (ref 135–145)

## 2021-03-29 MED ORDER — SODIUM CHLORIDE 0.9 % IV BOLUS
1000.0000 mL | Freq: Once | INTRAVENOUS | Status: AC
  Administered 2021-03-29: 18:00:00 1000 mL via INTRAVENOUS

## 2021-03-29 NOTE — ED Notes (Signed)
Pt to ER via EMS with reports getting dizzy while working outside, then when resting inside began to have muscle cramps.  Pt has hx of previous CVS with right sided weakness that is unchanged.

## 2021-03-29 NOTE — ED Triage Notes (Signed)
Pt here with cramping and dehydration after working outside in the heat today. Pt states that he has had blurred vision as well. PT as hx of a stroke, COPD, heart valve replacement.

## 2021-03-29 NOTE — ED Provider Notes (Signed)
[facility] Emergency Department Provider Note ____________________________________________   Event Date/Time   First MD Initiated Contact with Patient 03/29/21 1707     (approximate)  I have reviewed the triage vital signs and the nursing notes.   HISTORY  Chief Complaint Dehydration  HPI Mark Vang is a 60 y.o. male with history of CVA, COPD, and hypertension presents to the emergency department for treatment and evaluation after blurry vision and feeling dizzy today.  He was outside using the tiller to help a neighbor when he began to feel strange.  He sat down and drank some water and then went to his house.  Blood pressure was elevated at "over 200" he states it "dropped from fast after he cooled down" which was also concerning to him.  He denies new weakness, nausea, vomiting.  Because of his history of CVA he decided to come to the emergency department.  He denies dizziness or blurred vision at this time.  He states he does have a mild general headache..         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   Chronic shoulder pain (Right) 08/01/2018   Hypotension 07/17/2018   Headache disorder 06/13/2018   Cervicalgia 06/07/2018   Cervical facet hypertrophy (Bilateral) 06/07/2018   Abnormal MRI, cervical spine (05/17/2018) 05/31/2018   Neurogenic pain 05/30/2018   Cervical facet syndrome (Bilateral) (L>R) 05/30/2018   Spondylosis without myelopathy or radiculopathy, cervical region 05/30/2018   Chronic musculoskeletal pain 05/10/2018   Trigger point of neck 05/10/2018   Osteoarthritis of spine with radiculopathy, cervical region 05/09/2018   Chronic nonintractable  headache 05/03/2018   Chronic hip pain [facility]) (Right) 04/30/2018   Chronic neck pain (Primary Area of Pain) (Bilateral) (L>R) 04/30/2018   DDD (degenerative disc disease), cervical 04/30/2018   Muscle spasms of neck 04/30/2018   Opiate use 04/30/2018   Cervicogenic headache 04/30/2018   Occipital headache (Bilateral) 04/30/2018   Cervico-occipital neuralgia (Bilateral) 04/30/2018   Lumbar facet syndrome (Bilateral) 04/30/2018   Lumbar facet arthropathy (Bilateral) 04/30/2018   DDD (degenerative disc disease), lumbosacral 04/30/2018   Chronic sacroiliac joint pain (Left) 04/30/2018   Current tear knee, medial meniscus 04/27/2018   Osteoarthritis of knee 04/27/2018   Cervical radiculopathy 04/12/2018   Chronic lower extremity pain (Tertiary Area of Pain) (Bilateral) (R>L) 04/02/2018   Chronic low back pain (Secondary Area of Pain) (Bilateral) (R>L) 04/02/2018   Chronic pain syndrome 04/02/2018   Long term current use of opiate analgesic 04/02/2018   Pharmacologic therapy 04/02/2018   Disorder of skeletal system 04/02/2018   Problems influencing health status 04/02/2018   Seizure ([place]) 07/01/2016   Sinusitis 07/01/2016   Stroke ([place]) 07/01/2016   COPD (chronic obstructive pulmonary disease) ([place]) 01/11/2013   Dyspnea on exertion 01/11/2013   GERD (gastroesophageal reflux disease) 01/11/2013   HTN (hypertension) 01/11/2013   Seizures ([place]) 01/11/2013   Tobacco abuse 01/11/2013   Convulsions ([place]) 01/11/2013   Chronic sinusitis 12/13/2012   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 Authorizing Provider  amLODipine (NORVASC) 5 MG tablet Take 5 mg by mouth daily. Patient not taking: Reported on 09/22/2020    [provider]  aspirin 325 MG EC tablet Take 325 mg  by mouth daily.    [provider]  atorvastatin (LIPITOR) 40 MG tablet Take 40 mg by mouth daily.    [provider]  baclofen (LIORESAL) 10 MG tablet Take 1-2 tablets (10-20 mg total) by mouth 4 (four) times daily as needed for muscle spasms. 02/27/20 08/27/20  Milinda Pointer, MD  busPIRone (BUSPAR) 10 MG tablet Take 10 mg by mouth 3 (three) times daily.    [provider]  cholecalciferol (VITAMIN D) 1000 units tablet Take 50,000 Units by mouth once a week.     [provider]  cyclobenzaprine (FLEXERIL) 10 MG tablet Take 10 mg by mouth 3 (three) times daily as needed for muscle spasms.    [provider]  fenofibrate (TRICOR) 145 MG tablet Take 145 mg by mouth daily.    [provider]  fluticasone (FLONASE) 50 MCG/ACT nasal spray Place 1 spray into both nostrils daily.     [provider]  Fluticasone-Umeclidin-Vilant 100-62.5-25 MCG/INH AEPB Inhale into the lungs. Patient not taking: Reported on 09/22/2020    [provider]  gabapentin (NEURONTIN) 300 MG capsule Take 1-3 capsules (300-900 mg total) by mouth 4 (four) times daily. 02/27/20 08/27/20  Milinda Pointer, MD  Galcanezumab-gnlm [facility]) 120 MG/ML SOAJ Inject 120 mg into the skin every 28 (twenty-eight) days.    [provider]  hydrochlorothiazide (HYDRODIURIL) 25 MG tablet Take 25 mg by mouth daily.    [provider]  lisinopril (PRINIVIL,ZESTRIL) 20 MG tablet Take 20 mg by mouth daily.    [provider]  Multiple Vitamin (MULTIVITAMIN) tablet Take 1 tablet by mouth daily.    [provider]  pantoprazole (PROTONIX) 40 MG tablet Take 1 tablet by mouth daily. 04/10/18   [provider]  potassium citrate (UROCIT-K) 10 MEQ (1080 MG) SR tablet Take 10 mEq by mouth 3 (three) times daily with meals.    [provider]  Roflumilast (DALIRESP) 250 MCG TABS Take by mouth in the morning and at bedtime. Patient not  taking: No sig reported    [provider]  roflumilast (DALIRESP) 500 MCG TABS tablet Take by mouth. Patient not taking: No sig reported    [provider]  tamsulosin (FLOMAX) 0.4 MG CAPS capsule TAKE ONE CAPSULE BY MOUTH DAILY 02/04/21   McGowan, Larene [place] A, PA-C  Tiotropium Bromide-Olodaterol (STIOLTO RESPIMAT) 2.5-2.5 MCG/ACT AERS Inhale into the lungs.    [provider]    Allergies Amlodipine besy-benazepril hcl, Amlodipine, and Hydrochlorothiazide  Family History  Problem Relation Age of Onset   Cancer Mother    Cancer Father    Bladder Cancer Neg Hx    Kidney cancer Neg Hx    Prostate cancer Neg Hx     Social History Social History   Tobacco Use   Smoking status: Every Day    Packs/day: 1.00    Pack years: 0.00    Types: Cigarettes   Smokeless tobacco: Never   Tobacco comments:    hx copd  Vaping Use   Vaping Use: Never used  Substance Use Topics   Alcohol use: No   Drug use: No    Review of Systems  Constitutional: No fever/chills Eyes: Positive for visual changes. ENT: No sore throat. Cardiovascular:  Denies chest pain. Respiratory: Denies shortness of breath. Gastrointestinal: No abdominal pain.  No nausea, no vomiting.  No diarrhea.  No constipation. Genitourinary: Negative for dysuria. Musculoskeletal: Negative for back pain. Skin: Negative for rash. Neurological: Negative for headaches, focal weakness or numbness.  ____________________________________________   PHYSICAL EXAM:  VITAL SIGNS: ED Triage Vitals [03/29/21 1643]  Enc Vitals Group     BP 112/72     Pulse Rate 85     Resp 18     Temp 98.6 F (37 C)     Temp Source Oral     SpO2 99 %     Weight 236 lb (107 kg)     Height 6' (1.829 m)     Head Circumference      Peak Flow      Pain Score 8     Pain Loc      Pain Edu?      Excl. in [place]?     Constitutional: Alert and oriented. Well appearing and in no acute distress. Eyes: Conjunctivae are normal.  PERRL. EOMI. Head: Atraumatic. Nose: No congestion/rhinnorhea. Mouth/Throat: Mucous membranes are moist.  Oropharynx non-erythematous. Neck: No stridor.   Hematological/Lymphatic/Immunilogical: No cervical lymphadenopathy. Cardiovascular: Normal rate, regular rhythm. Grossly normal heart sounds.  Good peripheral circulation. Respiratory: Normal respiratory effort.  No retractions. Lungs CTAB. Gastrointestinal: Soft and nontender. No distention. No abdominal bruits. No CVA tenderness. Genitourinary:  Musculoskeletal: No lower extremity tenderness nor edema.  No joint effusions. Neurologic:  Normal speech and language. No gross focal neurologic deficits are appreciated. No gait instability.  No pronator drift, tongue protrudes midline, no facial droop, 5 out of 5 strength in lower extremities, and no change in sensation Skin:  Skin is warm, dry and intact. No rash noted. Psychiatric: Mood and affect are normal. Speech and behavior are normal.  ____________________________________________   LABS (all labs ordered are listed, but only abnormal results are displayed)  Labs Reviewed  BASIC METABOLIC PANEL - Abnormal; Notable for the following components:      Result Value   CO2 19 (*)    Glucose, Bld 108 (*)    Creatinine, Ser 1.78 (*)    GFR, Estimated 43 (*)    All other components within normal limits  CBC - Abnormal; Notable for the following components:   WBC 17.7 (*)    All other components within normal limits  URINALYSIS, COMPLETE (UACMP) WITH MICROSCOPIC - Abnormal; Notable for the following components:   Color, Urine YELLOW (*)    APPearance CLOUDY (*)    Protein, ur 30 (*)    All other components within normal limits  CBG MONITORING, ED   ____________________________________________  EKG  ED ECG REPORT I, Rhaya Coale, FNP-BC personally viewed and interpreted this ECG.   Date: 03/29/2021  EKG Time: 1651  Rate: 82  Rhythm: normal EKG, normal sinus rhythm  Axis:  normal  Intervals:none  ST&T Change: none  ____________________________________________  RADIOLOGY  ED MD interpretation:    CT head negative for acute concerns.  I, Sherrie George, personally viewed and evaluated these images (plain radiographs) as part of my medical decision making, as well as reviewing the written report by the radiologist.  Official radiology report(s): CT Head Wo Contrast  Result Date: 03/29/2021 CLINICAL DATA:  Dizziness, non-specific EXAM: CT HEAD WITHOUT CONTRAST TECHNIQUE: Contiguous axial images were obtained from the base of the skull through the vertex without intravenous contrast. COMPARISON:  Head CT and brain MRI 07/15/2020 FINDINGS: Brain: Minimal atrophy  is normal for age. No intracranial hemorrhage, mass effect, or midline shift. No hydrocephalus. The basilar cisterns are patent. No evidence of territorial infarct or acute ischemia. Previous small left frontal infarct on MRI is not well seen by CT. No extra-axial or intracranial fluid collection. Vascular: No hyperdense vessel or unexpected calcification. Skull: No fracture or focal lesion. Sinuses/Orbits: No acute findings postsurgical change of the paranasal sinuses. Minimal chronic opacification of medial left frontal sinus. No mastoid effusion. Other: None. IMPRESSION: No acute intracranial abnormality. Electronically Signed   By: Keith Rake M.D.   On: 03/29/2021 19:39    ____________________________________________   PROCEDURES  Procedure(s) performed (including Critical Care):  Procedures  ____________________________________________   INITIAL IMPRESSION / ASSESSMENT AND PLAN     60 year old male presenting to the emergency department for treatment and evaluation of blurred vision and dizziness while outside working in the heat.  Symptoms have since resolved but due to history of CVA he wanted to come to the emergency department for evaluation.  While awaiting ER room assignment, labs  were drawn.  His creatinine is mildly elevated at 1.7 a and his white blood cell count is 17.7.  He has no ketones in the urine.  He may have very mild dehydration with the slight increase in creatinine.  Plan will be to give him a liter of IV fluids.  DIFFERENTIAL DIAGNOSIS  Dehydration, CVA, transient hypertension  ED COURSE  Head CT is negative for acute concerns.  Patient reassured by results and feels safe for discharge.  He will be encouraged to return to the emergency department for symptoms that change or worsen or for new concerns.    ___________________________________________   FINAL CLINICAL IMPRESSION(S) / ED DIAGNOSES  Final diagnoses:  Dehydration after exertion     ED Discharge Orders     None        Mark Vang was evaluated in Emergency Department on 03/29/2021 for the symptoms described in the history of present illness. He was evaluated in the context of the global COVID-19 pandemic, which necessitated consideration that the patient might be at risk for infection with the SARS-CoV-2 virus that causes COVID-19. Institutional protocols and algorithms that pertain to the evaluation of patients at risk for COVID-19 are in a state of rapid change based on information released by regulatory bodies including the CDC and federal and state organizations. These policies and algorithms were followed during the patient's care in the ED.   Note:  This document was prepared using Dragon voice recognition software and may include unintentional dictation errors.    Victorino Dike, FNP 03/29/21 2246    Delman Kitten, MD 03/30/21 [phone]

## 2021-03-29 NOTE — Discharge Instructions (Addendum)
Please continue to rehydrate with fluids.  Avoid long periods outside in the heat.  Follow-up with your primary care provider if symptoms return.  Return to the emergency department for symptoms that change, worsen, or for new concerns.

## 2021-04-12 ENCOUNTER — Ambulatory Visit
Admission: RE | Admit: 2021-04-12 | Discharge: 2021-04-12 | Disposition: A | Discharge status: Home / Self Care | Payer: Medicaid Other | Source: Ambulatory Visit | Attending: Urology | Admitting: Urology

## 2021-04-12 ENCOUNTER — Ambulatory Visit
Admission: RE | Admit: 2021-04-12 | Discharge: 2021-04-12 | Disposition: A | Discharge status: Home / Self Care | Payer: Medicaid Other | Attending: Urology | Admitting: Urology

## 2021-04-12 DIAGNOSIS — N200 Calculus of kidney: Secondary | ICD-10-CM | POA: Diagnosis present

## 2021-04-21 NOTE — Progress Notes (Signed)
04/22/2021 2:48 PM   [name] 02/07/1961 [id number]  Referring provider: Remi Haggard, [place],  [place] [id number]  Urological history: 1. 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 Left upper pole calculus measures 4 mm -KUB 04/12/2021 - possible migration of the left renal stone into the proximal left ureter   Chief Complaint  Patient presents with   Nephrolithiasis     HPI: Mark Vang is a 60 y.o. male with nephrolithiasis who presents today for yearly follow up.   KUB demonstrates that possible the left renal stone has migrated into the proximal ureter.    He has not had any flank pain, gross hematuria or passage of fragments.  He has not other urinary complaints.    Patient denies any modifying or aggravating factors.  Patient denies any gross hematuria, dysuria or suprapubic/flank pain.  Patient denies any fevers, chills, nausea or vomiting.    He says his PCP checks his PSA yearly.     PMH: Past Medical History:  Diagnosis Date   COPD (chronic obstructive pulmonary disease) ([place])    Hypertension    Stroke [facility])     Surgical History: Past Surgical History:  Procedure Laterality Date   KNEE SURGERY     NASAL SINUS SURGERY      Home Medications:  Allergies as of 04/22/2021       Reactions   Amlodipine Besy-benazepril Hcl Other (See Comments)   Scales, burning, itching, rash   Amlodipine    Hydrochlorothiazide         Medication List        Accurate as of April 22, 2021  2:48 PM. If you have any questions, ask your nurse or doctor.          STOP taking these medications    hydrochlorothiazide 25 MG tablet Commonly known as: HYDRODIURIL Stopped by: Francyne Arreaga, PA-C   potassium citrate 10 MEQ (1080 MG) SR tablet Commonly known as: UROCIT-K Stopped by: Zara Council, PA-C       TAKE these medications    amLODipine 5 MG  tablet Commonly known as: NORVASC Take 5 mg by mouth daily.   aspirin 325 MG EC tablet Take 325 mg by mouth daily.   atorvastatin 40 MG tablet Commonly known as: LIPITOR Take 40 mg by mouth daily.   baclofen 10 MG tablet Commonly known as: LIORESAL Take 1-2 tablets (10-20 mg total) by mouth 4 (four) times daily as needed for muscle spasms.   busPIRone 10 MG tablet Commonly known as: BUSPAR Take 10 mg by mouth 3 (three) times daily. What changed: Another medication with the same name was removed. Continue taking this medication, and follow the directions you see here. Changed by: Zara Council, PA-C   cholecalciferol 1000 units tablet Commonly known as: VITAMIN D Take 50,000 Units by mouth once a week.   clonazePAM 0.5 MG tablet Commonly known as: KLONOPIN Take 0.5 mg by mouth daily as needed.   cyclobenzaprine 10 MG tablet Commonly known as: FLEXERIL Take 10 mg by mouth 3 (three) times daily as needed for muscle spasms.   Daliresp 250 MCG Tabs Generic drug: Roflumilast Take by mouth in the morning and at bedtime.   Daliresp 500 MCG Tabs tablet Generic drug: roflumilast Take by mouth.   Emgality 120 MG/ML Soaj Generic drug: Galcanezumab-gnlm Inject 120 mg into the skin every 28 (twenty-eight) days.   fenofibrate 145 MG tablet  Commonly known as: TRICOR Take 145 mg by mouth daily.   fluticasone 50 MCG/ACT nasal spray Commonly known as: FLONASE Place 1 spray into both nostrils daily.   Fluticasone-Umeclidin-Vilant 100-62.5-25 MCG/INH Aepb Inhale into the lungs.   gabapentin 300 MG capsule Commonly known as: NEURONTIN Take 1-3 capsules (300-900 mg total) by mouth 4 (four) times daily.   lisinopril 20 MG tablet Commonly known as: ZESTRIL Take 20 mg by mouth daily. What changed: Another medication with the same name was removed. Continue taking this medication, and follow the directions you see here. Changed by: Zara Council, PA-C   multivitamin  tablet Take 1 tablet by mouth daily.   nortriptyline 10 MG capsule Commonly known as: PAMELOR Take by mouth.   pantoprazole 40 MG tablet Commonly known as: PROTONIX Take 1 tablet by mouth daily.   potassium chloride 10 MEQ tablet Commonly known as: KLOR-CON Take 10 mEq by mouth daily.   Stiolto Respimat 2.5-2.5 MCG/ACT Aers Generic drug: Tiotropium Bromide-Olodaterol Inhale into the lungs.   SUMAtriptan 100 MG tablet Commonly known as: IMITREX Take 1/2-1 tab at headache onset. Can repeat once in 2 hours if needed.  No more than 2 doses in 24 hours   tamsulosin 0.4 MG Caps capsule Commonly known as: FLOMAX TAKE ONE CAPSULE BY MOUTH DAILY        Allergies:  Allergies  Allergen Reactions   Amlodipine Besy-Benazepril Hcl Other (See Comments)    Scales, burning, itching, rash   Amlodipine    Hydrochlorothiazide     Family History: Family History  Problem Relation Age of Onset   Cancer Mother    Cancer Father    Bladder Cancer Neg Hx    Kidney cancer Neg Hx    Prostate cancer Neg Hx     Social History:  reports that he has been smoking cigarettes. He has been smoking an average of 1 pack per day. He has never used smokeless tobacco. He reports that he does not drink alcohol and does not use drugs.  ROS: For pertinent review of systems please refer to history of present illness  Physical Exam: BP 119/75   Pulse 88   Ht 6' (1.829 m)   Wt 254 lb (115.2 kg)   BMI 34.45 kg/m   Constitutional:  Well nourished. Alert and oriented, No acute distress. HEENT: [place] AT, mask in place.  Trachea midline Cardiovascular: No clubbing, cyanosis, or edema. Respiratory: Normal respiratory effort, no increased work of breathing. Neurologic: Grossly intact, no focal deficits, moving all 4 extremities. Psychiatric: Normal mood and affect. Deferred GU and rectal exam  Laboratory Data: Lab Results  Component Value Date   WBC 17.7 (H) 03/29/2021   HGB 14.1 03/29/2021   HCT  42.5 03/29/2021   MCV 86.7 03/29/2021   PLT 341 03/29/2021    Lab Results  Component Value Date   CREATININE 1.78 (H) 03/29/2021   Lab Results  Component Value Date   AST 22 07/15/2020   Lab Results  Component Value Date   ALT 24 07/15/2020   Urinalysis Component     Latest Ref Rng & Units 03/29/2021  Color, Urine     YELLOW YELLOW (A)  Appearance     CLEAR CLOUDY (A)  Specific Gravity, Urine     1.005 - 1.030 1.008  pH     5.0 - 8.0 6.0  Glucose, UA     NEGATIVE mg/dL NEGATIVE  Hgb urine dipstick     NEGATIVE NEGATIVE  Bilirubin Urine  NEGATIVE NEGATIVE  Ketones, ur     NEGATIVE mg/dL NEGATIVE  Protein     NEGATIVE mg/dL 30 (A)  Nitrite     NEGATIVE NEGATIVE  Leukocytes,Ua     NEGATIVE NEGATIVE  RBC / HPF     0 - 5 RBC/hpf 0-5  WBC, UA     0 - 5 WBC/hpf 0-5  Bacteria, UA     NONE SEEN NONE SEEN  Squamous Epithelial / LPF     0 - 5 0-5  Mucus      PRESENT  Hyaline Casts, UA      PRESENT  Amorphous Crystal      PRESENT  I have reviewed the labs.  Pertinent Imaging Narrative & Impression  CLINICAL DATA:  Follow-up kidney stones   EXAM: ABDOMEN - 1 VIEW   COMPARISON:  04/22/2020   FINDINGS: Scattered large and small bowel gas is noted. Renal outlines are somewhat obscured by overlying bowel gas and fecal material. A calcification is noted just to the left of the lumbar spine and may represent the previously seen lower pole renal stone in the renal pelvis. Correlate with clinical history. No other definitive calculi are seen. Multiple phleboliths are noted within the pelvis stable from the prior exam.   IMPRESSION: Calcification is noted to the left of the midline which may represent interval migration of the previously seen calculus into the renal pelvis. CT urography may be helpful for further evaluation. No other focal abnormality is noted.     Electronically Signed   By: Inez Catalina M.D.   On: 04/13/2021 10:10  I have  independently reviewed the films.  See HPI.   Assessment & Plan:    1. Bilateral nephrolithiasis -possible migration of stone into the proximal ureter -asymptomatic at this visit Patient is advised that if they should start to experience pain that is not able to be controlled with pain medication, intractable nausea and/or vomiting and/or fevers greater than 103 or shaking chills to contact the office immediately or seek treatment in the emergency department for emergent intervention.     2. PSA screening -patient believe his PCP checks his PSA -discussed the importance of having the PSA/DRE yearly to screen for prostate cancer -he defers exam    Return in about 1 month (around 05/23/2021) for KUB and office visit .  These notes generated with voice recognition software. I apologize for typographical errors.  Zara Council, PA-C  [facility] [address]  [place], [place] [address] [phone]

## 2021-04-22 ENCOUNTER — Ambulatory Visit: Payer: Medicaid Other | Admitting: Urology

## 2021-04-22 ENCOUNTER — Other Ambulatory Visit: Payer: Self-pay

## 2021-04-22 ENCOUNTER — Encounter: Payer: Self-pay | Admitting: Urology

## 2021-04-22 VITALS — BP 119/75 | HR 88 | Ht 72.0 in | Wt 254.0 lb

## 2021-04-22 DIAGNOSIS — N200 Calculus of kidney: Secondary | ICD-10-CM

## 2021-04-22 DIAGNOSIS — Z125 Encounter for screening for malignant neoplasm of prostate: Secondary | ICD-10-CM | POA: Diagnosis not present

## 2021-04-27 NOTE — Progress Notes (Deleted)
04/28/2021 9:57 PM   Mark Vang 22-May-1961 [id number]  Referring provider: Remi Haggard, [place],   [id number]  Urological history: 1. 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 Left upper pole calculus measures 4 mm -KUB 04/12/2021 - possible migration of the left renal stone into the proximal left ureter   No chief complaint on file.    HPI: Mark Vang is a 60 y.o. male with nephrolithiasis who presents today for yearly follow up.   KUB demonstrates that possible the left renal stone has migrated into the proximal ureter.    He has not had any flank pain, gross hematuria or passage of fragments.  He has not other urinary complaints.    Patient denies any modifying or aggravating factors.  Patient denies any gross hematuria, dysuria or suprapubic/flank pain.  Patient denies any fevers, chills, nausea or vomiting.    He says his PCP checks his PSA yearly.     PMH: Past Medical History:  Diagnosis Date   COPD (chronic obstructive pulmonary disease) ([place])    Hypertension    Stroke [facility])     Surgical History: Past Surgical History:  Procedure Laterality Date   KNEE SURGERY     NASAL SINUS SURGERY      Home Medications:  Allergies as of 04/28/2021       Reactions   Amlodipine Besy-benazepril Hcl Other (See Comments)   Scales, burning, itching, rash   Amlodipine    Hydrochlorothiazide         Medication List        Accurate as of April 27, 2021  9:57 PM. If you have any questions, ask your nurse or doctor.          amLODipine 5 MG tablet Commonly known as: NORVASC Take 5 mg by mouth daily.   aspirin 325 MG EC tablet Take 325 mg by mouth daily.   atorvastatin 40 MG tablet Commonly known as: LIPITOR Take 40 mg by mouth daily.   baclofen 10 MG tablet Commonly known as: LIORESAL Take 1-2 tablets (10-20 mg total) by mouth 4 (four) times daily as  needed for muscle spasms.   busPIRone 10 MG tablet Commonly known as: BUSPAR Take 10 mg by mouth 3 (three) times daily.   cholecalciferol 1000 units tablet Commonly known as: VITAMIN D Take 50,000 Units by mouth once a week.   clonazePAM 0.5 MG tablet Commonly known as: KLONOPIN Take 0.5 mg by mouth daily as needed.   cyclobenzaprine 10 MG tablet Commonly known as: FLEXERIL Take 10 mg by mouth 3 (three) times daily as needed for muscle spasms.   Daliresp 250 MCG Tabs Generic drug: Roflumilast Take by mouth in the morning and at bedtime.   Daliresp 500 MCG Tabs tablet Generic drug: roflumilast Take by mouth.   Emgality 120 MG/ML Soaj Generic drug: Galcanezumab-gnlm Inject 120 mg into the skin every 28 (twenty-eight) days.   fenofibrate 145 MG tablet Commonly known as: TRICOR Take 145 mg by mouth daily.   fluticasone 50 MCG/ACT nasal spray Commonly known as: FLONASE Place 1 spray into both nostrils daily.   Fluticasone-Umeclidin-Vilant 100-62.5-25 MCG/INH Aepb Inhale into the lungs.   gabapentin 300 MG capsule Commonly known as: NEURONTIN Take 1-3 capsules (300-900 mg total) by mouth 4 (four) times daily.   lisinopril 20 MG tablet Commonly known as: ZESTRIL Take 20 mg by mouth daily.   multivitamin tablet Take 1  tablet by mouth daily.   nortriptyline 10 MG capsule Commonly known as: PAMELOR Take by mouth.   pantoprazole 40 MG tablet Commonly known as: PROTONIX Take 1 tablet by mouth daily.   potassium chloride 10 MEQ tablet Commonly known as: KLOR-CON Take 10 mEq by mouth daily.   Stiolto Respimat 2.5-2.5 MCG/ACT Aers Generic drug: Tiotropium Bromide-Olodaterol Inhale into the lungs.   SUMAtriptan 100 MG tablet Commonly known as: IMITREX Take 1/2-1 tab at headache onset. Can repeat once in 2 hours if needed.  No more than 2 doses in 24 hours   tamsulosin 0.4 MG Caps capsule Commonly known as: FLOMAX TAKE ONE CAPSULE BY MOUTH DAILY         Allergies:  Allergies  Allergen Reactions   Amlodipine Besy-Benazepril Hcl Other (See Comments)    Scales, burning, itching, rash   Amlodipine    Hydrochlorothiazide     Family History: Family History  Problem Relation Age of Onset   Cancer Mother    Cancer Father    Bladder Cancer Neg Hx    Kidney cancer Neg Hx    Prostate cancer Neg Hx     Social History:  reports that he has been smoking cigarettes. He has been smoking an average of 1 pack per day. He has never used smokeless tobacco. He reports that he does not drink alcohol and does not use drugs.  ROS: For pertinent review of systems please refer to history of present illness  Physical Exam: There were no vitals taken for this visit.  Constitutional:  Well nourished. Alert and oriented, No acute distress. HEENT: [place] AT, moist mucus membranes.  Trachea midline Cardiovascular: No clubbing, cyanosis, or edema. Respiratory: Normal respiratory effort, no increased work of breathing. GI: Abdomen is soft, non tender, non distended, no abdominal masses. Liver and spleen not palpable.  No hernias appreciated.  Stool sample for occult testing is not indicated.   GU: No CVA tenderness.  No bladder fullness or masses.  Patient with circumcised/uncircumcised phallus. ***Foreskin easily retracted***  Urethral meatus is patent.  No penile discharge. No penile lesions or rashes. Scrotum without lesions, cysts, rashes and/or edema.  Testicles are located scrotally bilaterally. No masses are appreciated in the testicles. Left and right epididymis are normal. Rectal: Patient with  normal sphincter tone. Anus and perineum without scarring or rashes. No rectal masses are appreciated. Prostate is approximately *** grams, *** nodules are appreciated. Seminal vesicles are normal. Skin: No rashes, bruises or suspicious lesions. Lymph: No inguinal adenopathy. Neurologic: Grossly intact, no focal deficits, moving all 4 extremities. Psychiatric:  Normal mood and affect.   Laboratory Data: N/A   Pertinent Imaging *** I have independently reviewed the films.  See HPI.   Assessment & Plan:    1. Bilateral nephrolithiasis -possible migration of stone into the proximal ureter -asymptomatic at this visit Patient is advised that if they should start to experience pain that is not able to be controlled with pain medication, intractable nausea and/or vomiting and/or fevers greater than 103 or shaking chills to contact the office immediately or seek treatment in the emergency department for emergent intervention.     2. PSA screening -patient believe his PCP checks his PSA -discussed the importance of having the PSA/DRE yearly to screen for prostate cancer -he defers exam    No follow-ups on file.  These notes generated with voice recognition software. I apologize for typographical errors.  Zara Council, [facility] [address]  [place], [place] [address] [phone]

## 2021-04-28 ENCOUNTER — Ambulatory Visit: Payer: Self-pay | Admitting: Urology

## 2021-04-28 DIAGNOSIS — N200 Calculus of kidney: Secondary | ICD-10-CM

## 2021-04-29 ENCOUNTER — Encounter: Payer: Self-pay | Admitting: Urology

## 2021-05-04 ENCOUNTER — Telehealth: Payer: Self-pay | Admitting: Urology

## 2021-05-04 NOTE — Telephone Encounter (Signed)
Pt called office asking about "no-show" appt he missed.  He will be out of town for a month.  I looked at last office note and told him Shannon's last note said a 1 month follow up with KUB prior.  We scheduled appt for the end of August.

## 2021-05-12 DIAGNOSIS — Z139 Encounter for screening, unspecified: Secondary | ICD-10-CM

## 2021-05-12 LAB — GLUCOSE, POCT (MANUAL RESULT ENTRY): POC Glucose: 160 mg/dl — AB (ref 70–99)

## 2021-05-12 NOTE — Congregational Nurse Program (Signed)
  Dept: [phone]   Congregational Nurse Program Note  Date of Encounter: 05/12/2021  Past Medical History: Past Medical History:  Diagnosis Date   COPD (chronic obstructive pulmonary disease) ([place])    Hypertension    Stroke [facility])     Encounter Details:  CNP Questionnaire - 05/12/21 1412       Questionnaire   Do you give verbal consent to treat you today? Yes    Visit Setting Church or Counselling psychologist Patient Served At [employer]    Patient Status Not Applicable    Medical Provider Yes    Insurance Medicaid    Intervention Assess (including screenings);Refer;Educate    Transportation Need transportation assistance    Food Have food insecurities    Referrals Other           Client into food pantry nurse only clinic requesting screening of glucose and BP. States he was seen by PCP Lorriane Shire) in July and got a call that his glucose was elevated. Has follow up appointment in October. Non fasting glucose 160 and BP 150/70. Also requests info about accessing a cell phone (referred to provider of limited cell phones for food stamp recipients). Also would like referral for vision screening as near vision is changing and glasses last prescribed 5 years ago. States Medicaid doesn't cover glasses. Also recently had a large decrease in SSI checks so has renegotiated rent to a lower price. Finances are very tight. Disabled and unable to work. Co. Regarding screening results and need to be following both BP and glucose. Co to decrease / limit carbs. Currently drinks ~1 gallon of gatorade each day. Co. To limit this to 1 glass /day and drink water instead. Client agrees. To RTC here next Wednesday to re-screen. Rhermann, RN

## 2021-05-26 ENCOUNTER — Other Ambulatory Visit: Payer: Self-pay | Admitting: Pain Medicine

## 2021-05-26 ENCOUNTER — Ambulatory Visit: Payer: Self-pay | Admitting: Urology

## 2021-05-26 ENCOUNTER — Ambulatory Visit: Payer: Medicaid Other | Admitting: Urology

## 2021-05-26 DIAGNOSIS — M62838 Other muscle spasm: Secondary | ICD-10-CM

## 2021-05-26 DIAGNOSIS — M7918 Myalgia, other site: Secondary | ICD-10-CM

## 2021-06-10 NOTE — Progress Notes (Deleted)
06/11/2021 10:52 PM   Mark Vang May 25, 1961 [id number]  Referring provider: Remi Haggard, [place],  [place] [address]  Urological history: 1. 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 Left upper pole calculus measures 4 mm -KUB 04/12/2021 - possible migration of the left renal stone into the proximal left ureter   No chief complaint on file.    HPI: Mark Vang is a 60 y.o. male with nephrolithiasis who presents today for yearly follow up.   KUB demonstrates that possible the left renal stone has migrated into the proximal ureter.    He has not had any flank pain, gross hematuria or passage of fragments.  He has not other urinary complaints.    Patient denies any modifying or aggravating factors.  Patient denies any gross hematuria, dysuria or suprapubic/flank pain.  Patient denies any fevers, chills, nausea or vomiting.    He says his PCP checks his PSA yearly.     PMH: Past Medical History:  Diagnosis Date   COPD (chronic obstructive pulmonary disease) ([place])    Hypertension    Stroke [facility])     Surgical History: Past Surgical History:  Procedure Laterality Date   KNEE SURGERY     NASAL SINUS SURGERY      Home Medications:  Allergies as of 06/11/2021       Reactions   Amlodipine Besy-benazepril Hcl Other (See Comments)   Scales, burning, itching, rash   Amlodipine    Hydrochlorothiazide         Medication List        Accurate as of June 10, 2021 10:52 PM. If you have any questions, ask your nurse or doctor.          amLODipine 5 MG tablet Commonly known as: NORVASC Take 5 mg by mouth daily.   aspirin 325 MG EC tablet Take 325 mg by mouth daily.   atorvastatin 40 MG tablet Commonly known as: LIPITOR Take 40 mg by mouth daily.   baclofen 10 MG tablet Commonly known as: LIORESAL Take 1-2 tablets (10-20 mg total) by mouth 4 (four) times  daily as needed for muscle spasms.   busPIRone 10 MG tablet Commonly known as: BUSPAR Take 10 mg by mouth 3 (three) times daily.   cholecalciferol 1000 units tablet Commonly known as: VITAMIN D Take 50,000 Units by mouth once a week.   clonazePAM 0.5 MG tablet Commonly known as: KLONOPIN Take 0.5 mg by mouth daily as needed.   cyclobenzaprine 10 MG tablet Commonly known as: FLEXERIL Take 10 mg by mouth 3 (three) times daily as needed for muscle spasms.   Daliresp 250 MCG Tabs Generic drug: Roflumilast Take by mouth in the morning and at bedtime.   Daliresp 500 MCG Tabs tablet Generic drug: roflumilast Take by mouth.   Emgality 120 MG/ML Soaj Generic drug: Galcanezumab-gnlm Inject 120 mg into the skin every 28 (twenty-eight) days.   fenofibrate 145 MG tablet Commonly known as: TRICOR Take 145 mg by mouth daily.   fluticasone 50 MCG/ACT nasal spray Commonly known as: FLONASE Place 1 spray into both nostrils daily.   Fluticasone-Umeclidin-Vilant 100-62.5-25 MCG/INH Aepb Inhale into the lungs.   gabapentin 300 MG capsule Commonly known as: NEURONTIN Take 1-3 capsules (300-900 mg total) by mouth 4 (four) times daily.   lisinopril 20 MG tablet Commonly known as: ZESTRIL Take 20 mg by mouth daily.   multivitamin tablet Take 1 tablet  by mouth daily.   nortriptyline 10 MG capsule Commonly known as: PAMELOR Take by mouth.   pantoprazole 40 MG tablet Commonly known as: PROTONIX Take 1 tablet by mouth daily.   potassium chloride 10 MEQ tablet Commonly known as: KLOR-CON Take 10 mEq by mouth daily.   Stiolto Respimat 2.5-2.5 MCG/ACT Aers Generic drug: Tiotropium Bromide-Olodaterol Inhale into the lungs.   SUMAtriptan 100 MG tablet Commonly known as: IMITREX Take 1/2-1 tab at headache onset. Can repeat once in 2 hours if needed.  No more than 2 doses in 24 hours   tamsulosin 0.4 MG Caps capsule Commonly known as: FLOMAX TAKE ONE CAPSULE BY MOUTH DAILY         Allergies:  Allergies  Allergen Reactions   Amlodipine Besy-Benazepril Hcl Other (See Comments)    Scales, burning, itching, rash   Amlodipine    Hydrochlorothiazide     Family History: Family History  Problem Relation Age of Onset   Cancer Mother    Cancer Father    Bladder Cancer Neg Hx    Kidney cancer Neg Hx    Prostate cancer Neg Hx     Social History:  reports that he has been smoking cigarettes. He has been smoking an average of 1 pack per day. He has never used smokeless tobacco. He reports that he does not drink alcohol and does not use drugs.  ROS: For pertinent review of systems please refer to history of present illness  Physical Exam: There were no vitals taken for this visit.  Constitutional:  Well nourished. Alert and oriented, No acute distress. HEENT: [place] AT, moist mucus membranes.  Trachea midline Cardiovascular: No clubbing, cyanosis, or edema. Respiratory: Normal respiratory effort, no increased work of breathing. GI: Abdomen is soft, non tender, non distended, no abdominal masses. Liver and spleen not palpable.  No hernias appreciated.  Stool sample for occult testing is not indicated.   GU: No CVA tenderness.  No bladder fullness or masses.  Patient with circumcised/uncircumcised phallus. ***Foreskin easily retracted***  Urethral meatus is patent.  No penile discharge. No penile lesions or rashes. Scrotum without lesions, cysts, rashes and/or edema.  Testicles are located scrotally bilaterally. No masses are appreciated in the testicles. Left and right epididymis are normal. Rectal: Patient with  normal sphincter tone. Anus and perineum without scarring or rashes. No rectal masses are appreciated. Prostate is approximately *** grams, *** nodules are appreciated. Seminal vesicles are normal. Skin: No rashes, bruises or suspicious lesions. Lymph: No inguinal adenopathy. Neurologic: Grossly intact, no focal deficits, moving all 4  extremities. Psychiatric: Normal mood and affect.   Laboratory Data: N/A  Pertinent Imaging *** I have independently reviewed the films.  See HPI.   Assessment & Plan:    1. Bilateral nephrolithiasis -possible migration of stone into the proximal ureter -asymptomatic at this visit Patient is advised that if they should start to experience pain that is not able to be controlled with pain medication, intractable nausea and/or vomiting and/or fevers greater than 103 or shaking chills to contact the office immediately or seek treatment in the emergency department for emergent intervention.     2. PSA screening -patient believe his PCP checks his PSA -discussed the importance of having the PSA/DRE yearly to screen for prostate cancer -he defers exam    No follow-ups on file.  These notes generated with voice recognition software. I apologize for typographical errors.  Zara Council, [place] [address]  [address]  [place], [place] [address] [phone]

## 2021-06-11 ENCOUNTER — Ambulatory Visit: Payer: Medicaid Other | Admitting: Urology

## 2021-06-11 DIAGNOSIS — N200 Calculus of kidney: Secondary | ICD-10-CM

## 2021-06-15 NOTE — Progress Notes (Signed)
06/16/2021 11:59 AM   [name] 09-09-1961 [id number]  Referring provider: Remi Haggard, [place],  [place] [address]  Urological history: 1. 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 Left upper pole calculus measures 4 mm -KUB 04/12/2021 - possible migration of the left renal stone into the proximal left ureter   Chief Complaint  Patient presents with   Follow-up   Nephrolithiasis    HPI: Basem Yannuzzi is a 60 y.o. male with nephrolithiasis who presents today for month follow up.   He continues to have pain in the left flank.  Patient denies any modifying or aggravating factors.  Patient denies any gross hematuria, dysuria or suprapubic/flank pain.  Patient denies any fevers, chills, nausea or vomiting.    UA negative for micro heme  KUB question of 3 mm stone at the L3 on the left.   PMH: Past Medical History:  Diagnosis Date   COPD (chronic obstructive pulmonary disease) ([place])    Hypertension    Stroke [facility])     Surgical History: Past Surgical History:  Procedure Laterality Date   KNEE SURGERY     NASAL SINUS SURGERY      Home Medications:  Allergies as of 06/16/2021       Reactions   Amlodipine Besy-benazepril Hcl Other (See Comments)   Scales, burning, itching, rash   Amlodipine    Hydrochlorothiazide         Medication List        Accurate as of June 16, 2021 11:59 AM. If you have any questions, ask your nurse or doctor.          STOP taking these medications    baclofen 10 MG tablet Commonly known as: LIORESAL Stopped by: Rheagan Nayak, PA-C   gabapentin 300 MG capsule Commonly known as: NEURONTIN Stopped by: Jaun Galluzzo, PA-C       TAKE these medications    amLODipine 5 MG tablet Commonly known as: NORVASC Take 5 mg by mouth daily.   aspirin 325 MG EC tablet Take 325 mg by mouth daily.   atorvastatin 40 MG  tablet Commonly known as: LIPITOR Take 40 mg by mouth daily.   busPIRone 10 MG tablet Commonly known as: BUSPAR Take 10 mg by mouth 3 (three) times daily.   cholecalciferol 1000 units tablet Commonly known as: VITAMIN D Take 50,000 Units by mouth once a week.   clonazePAM 0.5 MG tablet Commonly known as: KLONOPIN Take 0.5 mg by mouth daily as needed.   cyclobenzaprine 10 MG tablet Commonly known as: FLEXERIL Take 10 mg by mouth 3 (three) times daily as needed for muscle spasms.   Daliresp 250 MCG Tabs Generic drug: Roflumilast Take by mouth in the morning and at bedtime.   Daliresp 500 MCG Tabs tablet Generic drug: roflumilast Take by mouth.   Emgality 120 MG/ML Soaj Generic drug: Galcanezumab-gnlm Inject 120 mg into the skin every 28 (twenty-eight) days.   fenofibrate 145 MG tablet Commonly known as: TRICOR Take 145 mg by mouth daily.   fluticasone 50 MCG/ACT nasal spray Commonly known as: FLONASE Place 1 spray into both nostrils daily.   Fluticasone-Umeclidin-Vilant 100-62.5-25 MCG/INH Aepb Inhale into the lungs.   lisinopril 20 MG tablet Commonly known as: ZESTRIL Take 20 mg by mouth daily.   multivitamin tablet Take 1 tablet by mouth daily.   nortriptyline 10 MG capsule Commonly known as: PAMELOR Take by mouth.  pantoprazole 40 MG tablet Commonly known as: PROTONIX Take 1 tablet by mouth daily.   potassium chloride 10 MEQ tablet Commonly known as: KLOR-CON Take 10 mEq by mouth daily.   Stiolto Respimat 2.5-2.5 MCG/ACT Aers Generic drug: Tiotropium Bromide-Olodaterol Inhale into the lungs.   SUMAtriptan 100 MG tablet Commonly known as: IMITREX Take 1/2-1 tab at headache onset. Can repeat once in 2 hours if needed.  No more than 2 doses in 24 hours   tamsulosin 0.4 MG Caps capsule Commonly known as: FLOMAX TAKE ONE CAPSULE BY MOUTH DAILY        Allergies:  Allergies  Allergen Reactions   Amlodipine Besy-Benazepril Hcl Other (See  Comments)    Scales, burning, itching, rash   Amlodipine    Hydrochlorothiazide     Family History: Family History  Problem Relation Age of Onset   Cancer Mother    Cancer Father    Bladder Cancer Neg Hx    Kidney cancer Neg Hx    Prostate cancer Neg Hx     Social History:  reports that he has been smoking cigarettes. He has been smoking an average of 1 pack per day. He has never used smokeless tobacco. He reports that he does not drink alcohol and does not use drugs.  ROS: For pertinent review of systems please refer to history of present illness  Physical Exam: BP (!) 147/79   Pulse 72   Ht 6' (1.829 m)   Wt 234 lb (106.1 kg)   BMI 31.74 kg/m   Constitutional:  Well nourished. Alert and oriented, No acute distress. HEENT: [place] AT, mask in place.  Trachea midline Cardiovascular: No clubbing, cyanosis, or edema. Respiratory: Normal respiratory effort, no increased work of breathing. Neurologic: Grossly intact, no focal deficits, moving all 4 extremities. Psychiatric: Normal mood and affect.   Laboratory Data: N/A  Pertinent Imaging CLINICAL DATA:  Bilateral nephrolithiasis   EXAM: ABDOMEN - 1 VIEW   COMPARISON:  04/12/2021   FINDINGS: Scattered large and small bowel gas is noted. No free air is noted. Multiple phleboliths are again noted in the pelvis. The previously seen calcification to the left of the midline on prior exam is not well appreciated on today's study. Correlate with any recent stone passage. No other focal abnormality is noted.   IMPRESSION: Previously seen calcification to the left of the midline is not visualized on this exam.     Electronically Signed   By: Inez Catalina M.D.   On: 06/16/2021 22:58   I have independently reviewed the films.  See HPI.   Assessment & Plan:    1. Bilateral nephrolithiasis/Left flank pain  -possible migration of stone into the proximal ureter -patient with continued left flank pain -will order a CT  renal stone study for further evaluation    Return for CT renal stone study report .  These notes generated with voice recognition software. I apologize for typographical errors.  Zara Council, PA-C  [facility] [address]  [place], [place] [address] [phone]

## 2021-06-16 ENCOUNTER — Encounter: Payer: Self-pay | Admitting: Urology

## 2021-06-16 ENCOUNTER — Ambulatory Visit
Admission: RE | Admit: 2021-06-16 | Discharge: 2021-06-16 | Disposition: A | Discharge status: Home / Self Care | Payer: Medicaid Other | Source: Ambulatory Visit | Attending: Urology | Admitting: Urology

## 2021-06-16 ENCOUNTER — Other Ambulatory Visit: Payer: Self-pay

## 2021-06-16 ENCOUNTER — Ambulatory Visit: Payer: Medicaid Other | Admitting: Urology

## 2021-06-16 VITALS — BP 147/79 | HR 72 | Ht 72.0 in | Wt 234.0 lb

## 2021-06-16 DIAGNOSIS — N200 Calculus of kidney: Secondary | ICD-10-CM

## 2021-06-16 DIAGNOSIS — R109 Unspecified abdominal pain: Secondary | ICD-10-CM | POA: Diagnosis not present

## 2021-06-16 NOTE — Patient Instructions (Signed)
[phone].    

## 2021-06-17 LAB — MICROSCOPIC EXAMINATION
Bacteria, UA: NONE SEEN
Epithelial Cells (non renal): NONE SEEN /hpf (ref 0–10)

## 2021-06-17 LAB — URINALYSIS, COMPLETE
Bilirubin, UA: NEGATIVE
Glucose, UA: NEGATIVE
Ketones, UA: NEGATIVE
Leukocytes,UA: NEGATIVE
Nitrite, UA: NEGATIVE
Protein,UA: NEGATIVE
RBC, UA: NEGATIVE
Specific Gravity, UA: 1.01 (ref 1.005–1.030)
Urobilinogen, Ur: 0.2 mg/dL (ref 0.2–1.0)
pH, UA: 6 (ref 5.0–7.5)

## 2021-06-30 ENCOUNTER — Ambulatory Visit: Payer: Medicaid Other | Admitting: Urology

## 2021-07-08 ENCOUNTER — Ambulatory Visit
Admission: RE | Admit: 2021-07-08 | Discharge: 2021-07-08 | Disposition: A | Discharge status: Home / Self Care | Payer: Medicaid Other | Source: Ambulatory Visit | Attending: Urology | Admitting: Urology

## 2021-07-08 ENCOUNTER — Other Ambulatory Visit: Payer: Self-pay

## 2021-07-08 DIAGNOSIS — N200 Calculus of kidney: Secondary | ICD-10-CM | POA: Insufficient documentation

## 2021-07-08 DIAGNOSIS — R109 Unspecified abdominal pain: Secondary | ICD-10-CM | POA: Diagnosis present

## 2021-07-08 NOTE — Progress Notes (Signed)
07/09/2021 6:57 PM   Mark Vang [birth year] [id number]  Referring provider: Remi Haggard, [address] [place] [place],  [place] [address]  Chief Complaint  Patient presents with   Nephrolithiasis     Urological history: 1. 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 Left upper pole calculus measures 4 mm -KUB 04/12/2021 - possible migration of the left renal stone into the proximal left ureter   HPI: Mark Vang is a 60 y.o. male with nephrolithiasis who presents today for CT report.   CT renal stone study 07/08/2021 bilateral nephrolithiasis.  No ureteral stone or hydronephrosis noted.   The left flank pain has diminshed and is now mild. Patient denies any modifying or aggravating factors.  Patient denies any gross hematuria, dysuria or suprapubic/flank pain.  Patient denies any fevers, chills, nausea or vomiting.    PMH: Past Medical History:  Diagnosis Date   COPD (chronic obstructive pulmonary disease) ([place])    Hypertension    Stroke [facility])     Surgical History: Past Surgical History:  Procedure Laterality Date   KNEE SURGERY     NASAL SINUS SURGERY      Home Medications:  Allergies as of 07/09/2021       Reactions   Amlodipine Besy-benazepril Hcl Other (See Comments)   Scales, burning, itching, rash   Amlodipine    Hydrochlorothiazide         Medication List        Accurate as of July 09, 2021 11:59 PM. If you have any questions, ask your nurse or doctor.          STOP taking these medications    amLODipine 5 MG tablet Commonly known as: NORVASC Stopped by: Zara Council, PA-C       TAKE these medications    aspirin 325 MG EC tablet Take 325 mg by mouth daily.   atorvastatin 40 MG tablet Commonly known as: LIPITOR Take 40 mg by mouth daily.   busPIRone 30 MG tablet Commonly known as: BUSPAR Take 30 mg by mouth 2 (two) times daily. What changed:  Another medication with the same name was removed. Continue taking this medication, and follow the directions you see here. Changed by: Zara Council, PA-C   cholecalciferol 1000 units tablet Commonly known as: VITAMIN D Take 50,000 Units by mouth once a week.   clonazePAM 0.5 MG tablet Commonly known as: KLONOPIN Take 0.5 mg by mouth daily as needed.   cyclobenzaprine 10 MG tablet Commonly known as: FLEXERIL Take 10 mg by mouth 3 (three) times daily as needed for muscle spasms.   Daliresp 250 MCG Tabs Generic drug: Roflumilast Take by mouth in the morning and at bedtime.   Daliresp 500 MCG Tabs tablet Generic drug: roflumilast Take by mouth.   Emgality 120 MG/ML Soaj Generic drug: Galcanezumab-gnlm Inject 120 mg into the skin every 28 (twenty-eight) days.   fenofibrate 145 MG tablet Commonly known as: TRICOR Take 145 mg by mouth daily.   fluticasone 50 MCG/ACT nasal spray Commonly known as: FLONASE Place 1 spray into both nostrils daily.   Fluticasone-Umeclidin-Vilant 100-62.5-25 MCG/INH Aepb Inhale into the lungs.   gabapentin 300 MG capsule Commonly known as: NEURONTIN Take 300 mg by mouth 3 (three) times daily.   lisinopril 20 MG tablet Commonly known as: ZESTRIL Take 20 mg by mouth daily.   multivitamin tablet Take 1 tablet by mouth daily.   nortriptyline 10 MG capsule Commonly  known as: PAMELOR Take by mouth.   pantoprazole 40 MG tablet Commonly known as: PROTONIX Take 1 tablet by mouth daily.   potassium chloride 10 MEQ tablet Commonly known as: KLOR-CON Take 10 mEq by mouth daily.   Stiolto Respimat 2.5-2.5 MCG/ACT Aers Generic drug: Tiotropium Bromide-Olodaterol Inhale into the lungs.   SUMAtriptan 100 MG tablet Commonly known as: IMITREX Take 1/2-1 tab at headache onset. Can repeat once in 2 hours if needed.  No more than 2 doses in 24 hours   tamsulosin 0.4 MG Caps capsule Commonly known as: FLOMAX TAKE ONE CAPSULE BY MOUTH DAILY         Allergies:  Allergies  Allergen Reactions   Amlodipine Besy-Benazepril Hcl Other (See Comments)    Scales, burning, itching, rash   Amlodipine    Hydrochlorothiazide     Family History: Family History  Problem Relation Age of Onset   Cancer Mother    Cancer Father    Bladder Cancer Neg Hx    Kidney cancer Neg Hx    Prostate cancer Neg Hx     Social History:  reports that he has been smoking cigarettes. He has been smoking an average of 1 pack per day. He has never used smokeless tobacco. He reports that he does not drink alcohol and does not use drugs.  ROS: For pertinent review of systems please refer to history of present illness  Physical Exam: BP 136/72   Pulse 71   Ht 6' (1.829 m)   Wt 234 lb (106.1 kg)   BMI 31.74 kg/m   Constitutional:  Well nourished. Alert and oriented, No acute distress. HEENT: [place] AT, mask in place  Trachea midline Cardiovascular: No clubbing, cyanosis, or edema. Respiratory: Normal respiratory effort, no increased work of breathing. Neurologic: Grossly intact, no focal deficits, moving all 4 extremities. Psychiatric: Normal mood and affect.   Laboratory Data: N/A  Pertinent Imaging CLINICAL DATA:  Flank pain.  Kidney stone suspected   EXAM: CT ABDOMEN AND PELVIS WITHOUT CONTRAST   TECHNIQUE: Multidetector CT imaging of the abdomen and pelvis was performed following the standard protocol without IV contrast.   COMPARISON:  CT 03/07/2018   FINDINGS: Lower chest: Lung bases are clear.   Hepatobiliary: Low-attenuation liver.  Gallbladder normal.   Pancreas: Pancreas is normal. No ductal dilatation. No pancreatic inflammation.   Spleen: Normal spleen   Adrenals/urinary tract: Adrenal glands normal. 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 There are three 2 mm calculi within the RIGHT kidney. No ureterolithiasis or obstructive uropathy.   The LEFT kidney is more normal shape. There is 2  nonobstructing calculi in LEFT kidney. No LEFT ureterolithiasis or obstructive uropathy.   No bladder calculi   Stomach/Bowel: Stomach, small bowel, appendix, and cecum are normal. The colon and rectosigmoid colon are normal.   Vascular/Lymphatic: Abdominal aorta is normal caliber with atherosclerotic calcification. There is no retroperitoneal or periportal lymphadenopathy. No pelvic lymphadenopathy.   Reproductive: Prostate normal   Other: No free fluid.   Musculoskeletal: No aggressive osseous lesion.   IMPRESSION: 1. Bilateral nephrolithiasis. 2. 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 No change from prior. 3. No ureterolithiasis or obstructive uropathy.     Electronically Signed   By: Suzy Bouchard M.D.   On: 07/09/2021 17:45   I have independently reviewed the films.  See HPI.   Assessment & Plan:    1. Bilateral nephrolithiasis/Left flank pain  -CT renal  stone study did not reveal any ureteral stones or hydronephrosis -flank pain likely MSK -follow up with PCP regarding flank pain  2. Bilateral nephrolithiasis -explained that renal stones without obstruction do not cause pain -the stones may migrate in the future, but there was no way to predict when this will happen  Return in about 1 year (around 07/09/2022) for KUB and symptom recheck .  These notes generated with voice recognition software. I apologize for typographical errors.  Zara Council, PA-C  [facility] [address]  [place],  [address] [phone]

## 2021-07-09 ENCOUNTER — Ambulatory Visit: Payer: Medicaid Other | Admitting: Urology

## 2021-07-09 ENCOUNTER — Encounter: Payer: Self-pay | Admitting: Urology

## 2021-07-09 VITALS — BP 136/72 | HR 71 | Ht 72.0 in | Wt 234.0 lb

## 2021-07-09 DIAGNOSIS — R109 Unspecified abdominal pain: Secondary | ICD-10-CM | POA: Diagnosis not present

## 2021-07-09 DIAGNOSIS — N200 Calculus of kidney: Secondary | ICD-10-CM

## 2021-07-09 DIAGNOSIS — R10A0 Flank pain, unspecified side: Secondary | ICD-10-CM

## 2021-07-12 ENCOUNTER — Telehealth: Payer: Self-pay

## 2021-07-12 NOTE — Telephone Encounter (Signed)
-----   Message from Nori Riis, PA-C sent at 07/11/2021  8:15 PM EDT ----- Please let Mr. Carmickle know that the radiologist did not note any other findings that the small stone in his kidneys.

## 2021-07-12 NOTE — Telephone Encounter (Signed)
Pt calls triage line and states he is returning a call, advised him of results below. Pt voiced understanding.

## 2022-04-28 ENCOUNTER — Other Ambulatory Visit: Payer: Self-pay | Admitting: Urology

## 2022-04-28 NOTE — Telephone Encounter (Signed)
Pt called, having no issues, except that he ran out of Rx Tamsulosin (exp 02/04/22). Pharmacy is [facility].  Thank you

## 2022-07-12 ENCOUNTER — Ambulatory Visit: Payer: Medicaid Other | Admitting: Urology

## 2022-07-20 NOTE — Progress Notes (Signed)
07/21/2022 8:30 AM   Mark Vang 1961/02/05 [id number]  Referring provider: Remi Haggard, [address] [place],  [place] [address]  Urological history: 1. Nephrolithiasis - CT renal stone study (06/2021) - Bilateral nephrolithiasis - KUB no stones seen   HPI: Mark Vang is a 61 y.o. male with nephrolithiasis who presents today for yearly follow up.   KUB no stones seen  He has no urinary complaints at this time.  Patient denies any modifying or aggravating factors.  Patient denies any gross hematuria, dysuria or suprapubic/flank pain.  Patient denies any fevers, chills, nausea or vomiting.     PMH: Past Medical History:  Diagnosis Date   COPD (chronic obstructive pulmonary disease) ([place])    Hypertension    Stroke [facility])     Surgical History: Past Surgical History:  Procedure Laterality Date   KNEE SURGERY     NASAL SINUS SURGERY      Home Medications:  Allergies as of 07/21/2022       Reactions   Amlodipine Besy-benazepril Hcl Other (See Comments)   Scales, burning, itching, rash   Amlodipine    Hydrochlorothiazide         Medication List        Accurate as of July 21, 2022 11:59 PM. If you have any questions, ask your nurse or doctor.          aspirin EC 325 MG tablet Take 325 mg by mouth daily.   atorvastatin 40 MG tablet Commonly known as: LIPITOR Take 40 mg by mouth daily.   busPIRone 30 MG tablet Commonly known as: BUSPAR Take 30 mg by mouth 2 (two) times daily.   cholecalciferol 1000 units tablet Commonly known as: VITAMIN D Take 50,000 Units by mouth once a week.   clonazePAM 0.5 MG tablet Commonly known as: KLONOPIN Take 0.5 mg by mouth daily as needed.   cyclobenzaprine 10 MG tablet Commonly known as: FLEXERIL Take 10 mg by mouth 3 (three) times daily as needed for muscle spasms.   Daliresp 250 MCG Tabs Generic drug: Roflumilast Take by mouth in the morning and at bedtime.   Daliresp 500 MCG Tabs  tablet Generic drug: roflumilast Take by mouth.   Emgality 120 MG/ML Soaj Generic drug: Galcanezumab-gnlm Inject 120 mg into the skin every 28 (twenty-eight) days.   fenofibrate 145 MG tablet Commonly known as: TRICOR Take 145 mg by mouth daily.   fluticasone 50 MCG/ACT nasal spray Commonly known as: FLONASE Place 1 spray into both nostrils daily.   Fluticasone-Umeclidin-Vilant 100-62.5-25 MCG/INH Aepb Inhale into the lungs.   gabapentin 300 MG capsule Commonly known as: NEURONTIN Take 300 mg by mouth 3 (three) times daily.   lisinopril 20 MG tablet Commonly known as: ZESTRIL Take 20 mg by mouth daily.   multivitamin tablet Take 1 tablet by mouth daily.   nortriptyline 10 MG capsule Commonly known as: PAMELOR Take by mouth.   pantoprazole 40 MG tablet Commonly known as: PROTONIX Take 1 tablet by mouth daily.   potassium chloride 10 MEQ tablet Commonly known as: KLOR-CON Take 10 mEq by mouth daily.   Stiolto Respimat 2.5-2.5 MCG/ACT Aers Generic drug: Tiotropium Bromide-Olodaterol Inhale into the lungs.   SUMAtriptan 100 MG tablet Commonly known as: IMITREX Take 1/2-1 tab at headache onset. Can repeat once in 2 hours if needed.  No more than 2 doses in 24 hours   tamsulosin 0.4 MG Caps capsule Commonly known as: FLOMAX TAKE ONE CAPSULE BY MOUTH DAILY  Allergies:  Allergies  Allergen Reactions   Amlodipine Besy-Benazepril Hcl Other (See Comments)    Scales, burning, itching, rash   Amlodipine    Hydrochlorothiazide     Family History: Family History  Problem Relation Age of Onset   Cancer Mother    Cancer Father    Bladder Cancer Neg Hx    Kidney cancer Neg Hx    Prostate cancer Neg Hx     Social History:  reports that he has been smoking cigarettes. He has been smoking an average of 1 pack per day. He has never used smokeless tobacco. He reports that he does not drink alcohol and does not use drugs.  ROS: For pertinent review of  systems please refer to history of present illness  Physical Exam: BP 130/73   Pulse 80   Ht 6' (1.829 m)   Wt 247 lb (112 kg)   BMI 33.50 kg/m   Constitutional:  Well nourished. Alert and oriented, No acute distress. HEENT: [place] AT, moist mucus membranes.  Trachea midline Cardiovascular: No clubbing, cyanosis, or edema. Respiratory: Normal respiratory effort, no increased work of breathing. Neurologic: Grossly intact, no focal deficits, moving all 4 extremities. Psychiatric: Normal mood and affect.   Laboratory Data: N/A  Pertinent Imaging Narrative & Impression  CLINICAL DATA:  Flank pain.   EXAM: ABDOMEN - 1 VIEW   COMPARISON:  June 16, 2021.  July 08, 2021.   FINDINGS: The bowel gas pattern is normal. Stable multiple phleboliths are noted in the pelvis. No definite nephrolithiasis is noted currently.   IMPRESSION: No definite nephrolithiasis.     Electronically Signed   By: Marijo Conception M.D.   On: 07/24/2022 08:27     I have independently reviewed the films.  See HPI.   Assessment & Plan:    1. Bilateral nephrolithiasis -no visible on today's KUB  Return in about 1 year (around 07/22/2023) for KUB.  These notes generated with voice recognition software. I apologize for typographical errors.  [place], [place] [address]  [employer] [place], [place] [address] [phone]

## 2022-07-21 ENCOUNTER — Ambulatory Visit
Admission: RE | Admit: 2022-07-21 | Discharge: 2022-07-21 | Disposition: A | Discharge status: Home / Self Care | Payer: Medicaid Other | Source: Ambulatory Visit | Attending: Urology | Admitting: Urology

## 2022-07-21 ENCOUNTER — Ambulatory Visit
Admission: RE | Admit: 2022-07-21 | Discharge: 2022-07-21 | Disposition: A | Discharge status: Home / Self Care | Payer: Medicaid Other | Attending: Urology | Admitting: Urology

## 2022-07-21 ENCOUNTER — Ambulatory Visit: Payer: Medicaid Other | Admitting: Urology

## 2022-07-21 ENCOUNTER — Other Ambulatory Visit: Payer: Self-pay | Admitting: Urology

## 2022-07-21 VITALS — BP 130/73 | HR 80 | Ht 72.0 in | Wt 247.0 lb

## 2022-07-21 DIAGNOSIS — N200 Calculus of kidney: Secondary | ICD-10-CM | POA: Diagnosis present

## 2022-07-21 DIAGNOSIS — Z87442 Personal history of urinary calculi: Secondary | ICD-10-CM | POA: Diagnosis not present

## 2022-07-25 ENCOUNTER — Encounter: Payer: Self-pay | Admitting: Urology

## 2023-03-24 ENCOUNTER — Encounter: Payer: Self-pay | Admitting: Radiology

## 2023-03-24 ENCOUNTER — Other Ambulatory Visit: Payer: Self-pay

## 2023-03-24 ENCOUNTER — Emergency Department
Admission: EM | Admit: 2023-03-24 | Discharge: 2023-03-24 | Disposition: A | Discharge status: Home / Self Care | Payer: Medicaid Other | Attending: Emergency Medicine | Admitting: Emergency Medicine

## 2023-03-24 ENCOUNTER — Emergency Department: Payer: Medicaid Other

## 2023-03-24 DIAGNOSIS — R1011 Right upper quadrant pain: Secondary | ICD-10-CM | POA: Diagnosis present

## 2023-03-24 LAB — URINALYSIS, ROUTINE W REFLEX MICROSCOPIC
Bacteria, UA: NONE SEEN
Bilirubin Urine: NEGATIVE
Glucose, UA: NEGATIVE mg/dL
Ketones, ur: NEGATIVE mg/dL
Leukocytes,Ua: NEGATIVE
Nitrite: NEGATIVE
Protein, ur: NEGATIVE mg/dL
RBC / HPF: >50 RBC/hpf (ref 0–5)
Specific Gravity, Urine: 1.017 (ref 1.005–1.030)
pH: 5 (ref 5.0–8.0)

## 2023-03-24 LAB — COMPREHENSIVE METABOLIC PANEL
ALT: 74 U/L — ABNORMAL HIGH (ref 0–44)
AST: 41 U/L (ref 15–41)
Albumin: 4.7 g/dL (ref 3.5–5.0)
Alkaline Phosphatase: 71 U/L (ref 38–126)
Anion gap: 11 (ref 5–15)
BUN: 23 mg/dL (ref 8–23)
CO2: 19 mmol/L — ABNORMAL LOW (ref 22–32)
Calcium: 9.4 mg/dL (ref 8.9–10.3)
Chloride: 101 mmol/L (ref 98–111)
Creatinine, Ser: 0.97 mg/dL (ref 0.61–1.24)
GFR, Estimated: >60 mL/min (ref >60)
Glucose, Bld: 110 mg/dL — ABNORMAL HIGH (ref 70–99)
Potassium: 4.2 mmol/L (ref 3.5–5.1)
Sodium: 131 mmol/L — ABNORMAL LOW (ref 135–145)
Total Bilirubin: 0.4 mg/dL (ref 0.3–1.2)
Total Protein: 7.9 g/dL (ref 6.5–8.1)

## 2023-03-24 LAB — CBC
HCT: 47.1 % (ref 39.0–52.0)
Hemoglobin: 14.9 g/dL (ref 13.0–17.0)
MCH: 27.9 pg (ref 26.0–34.0)
MCHC: 31.6 g/dL (ref 30.0–36.0)
MCV: 88.2 fL (ref 80.0–100.0)
Platelets: 351 10*3/uL (ref 150–400)
RBC: 5.34 MIL/uL (ref 4.22–5.81)
RDW: 14.3 % (ref 11.5–15.5)
WBC: 14.9 10*3/uL — ABNORMAL HIGH (ref 4.0–10.5)
nRBC: 0 % (ref 0.0–0.2)

## 2023-03-24 LAB — LIPASE, BLOOD: Lipase: 33 U/L (ref 11–51)

## 2023-03-24 MED ORDER — IOHEXOL 300 MG/ML  SOLN
100.0000 mL | Freq: Once | INTRAMUSCULAR | Status: AC | PRN
  Administered 2023-03-24: 100 mL via INTRAVENOUS

## 2023-03-24 MED ORDER — DICYCLOMINE HCL 10 MG PO CAPS: 10.0000 mg | ORAL_CAPSULE | Freq: Three times a day (TID) | ORAL | 1 refills | Status: DC

## 2023-03-24 NOTE — ED Provider Notes (Signed)
[facility] Provider Note  Patient Contact: 4:04 PM (approximate)   History   Abdominal Pain   HPI  Mark Vang is a 62 y.o. male who presents the emergency department complaining of right upper quadrant abdominal pain and abdominal bloating.  Patient states that he has had pain x 2 days.  Feels like he is bloating as well.  Pain is mostly located in the right upper quadrant.  Said no fevers, chills, emesis, diarrhea, constipation.  No urinary changes.  Patient does have a history of kidney stones.  States that he has a history of elevated LFTs from his primary care though I cannot find these labs and the medical record.  No other complaints to include URI symptoms, chest pain, shortness of breath, urinary changes.     Physical Exam   Triage Vital Signs: ED Triage Vitals  Enc Vitals Group     BP 03/24/23 1428 137/80     Pulse Rate 03/24/23 1428 86     Resp 03/24/23 1428 16     Temp 03/24/23 1428 98.8 F (37.1 C)     Temp Source 03/24/23 1428 Oral     SpO2 03/24/23 1428 97 %     Weight 03/24/23 1428 256 lb (116.1 kg)     Height 03/24/23 1428 6' (1.829 m)     Head Circumference --      Peak Flow --      Pain Score 03/24/23 1423 5     Pain Loc --      Pain Edu? --      Excl. in GC? --     Most recent vital signs: Vitals:   03/24/23 1428  BP: 137/80  Pulse: 86  Resp: 16  Temp: 98.8 F (37.1 C)  SpO2: 97%     General: Alert and in no acute distress.  Cardiovascular:  Good peripheral perfusion Respiratory: Normal respiratory effort without tachypnea or retractions. Lungs CTAB. Good air entry to the bases with no decreased or absent breath sounds. Gastrointestinal: Bowel sounds 4 quadrants.  Soft to palpation, tender in the right upper quadrant.. No guarding or rigidity. No palpable masses. No distention. No CVA tenderness. Musculoskeletal: Full range of motion to all extremities.  Neurologic:  No gross focal neurologic deficits are  appreciated.  Skin:   No rash noted Other:   ED Results / Procedures / Treatments   Labs (all labs ordered are listed, but only abnormal results are displayed) Labs Reviewed  COMPREHENSIVE METABOLIC PANEL - Abnormal; Notable for the following components:      Result Value   Sodium 131 (*)    CO2 19 (*)    Glucose, Bld 110 (*)    ALT 74 (*)    All other components within normal limits  CBC - Abnormal; Notable for the following components:   WBC 14.9 (*)    All other components within normal limits  URINALYSIS, ROUTINE W REFLEX MICROSCOPIC - Abnormal; Notable for the following components:   Color, Urine YELLOW (*)    APPearance CLEAR (*)    Hgb urine dipstick MODERATE (*)    All other components within normal limits  LIPASE, BLOOD     EKG     RADIOLOGY  I personally viewed, evaluated, and interpreted these images as part of my medical decision making, as well as reviewing the written report by the radiologist.  ED Provider Interpretation: No acute findings on CT abdomen pelvis.  Patient has chronic kidney stones bilaterally that  are not in the ureters.  CT ABDOMEN PELVIS W CONTRAST  Result Date: 03/24/2023 CLINICAL DATA:  Acute abdominal pain EXAM: CT ABDOMEN AND PELVIS WITH CONTRAST TECHNIQUE: Multidetector CT imaging of the abdomen and pelvis was performed using the standard protocol following bolus administration of intravenous contrast. RADIATION DOSE REDUCTION: This exam was performed according to the departmental dose-optimization program which includes automated exposure control, adjustment of the mA and/or kV according to patient size and/or use of iterative reconstruction technique. CONTRAST:  OMNIPAQUE IOHEXOL 300 MG/ML  SOLN COMPARISON:  07/08/2021 FINDINGS: Lower chest: No pleural or pericardial effusion. Hepatobiliary: No focal liver abnormality is seen. No gallstones, gallbladder wall thickening, or biliary dilatation. Pancreas: Unremarkable. No pancreatic  ductal dilatation or surrounding inflammatory changes. Spleen: Normal in size without focal abnormality. Adrenals/Urinary Tract: No adrenal mass. Symmetric renal enhancement without focal lesion. 5 mm calculus centrally in the right renal collecting system without hydronephrosis. 2 mm calculus peripherally in the mid left renal collecting system. No hydronephrosis. 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 Urinary bladder physiologically distended. Stomach/Bowel: Stomach partially distended, without acute finding. Small bowel decompressed. Normal appendix. Colon partially distended by gas and fecal material, unremarkable. Vascular/Lymphatic: Moderate aortoiliac calcified atheromatous plaque without aneurysm. Portal vein patent. No abdominal or pelvic adenopathy. Reproductive: Prostate is unremarkable. Other: Bilateral pelvic phleboliths.  No ascites.  No free air. Musculoskeletal: Facet and disc degenerative change L5-S1. No fracture or worrisome bone lesion. IMPRESSION: 1. No acute findings. 2. Bilateral nephrolithiasis without hydronephrosis. 3.  Aortic Atherosclerosis (ICD10-I70.0). Electronically Signed   By: Corlis Leak M.D.   On: 03/24/2023 16:49    PROCEDURES:  Critical Care performed: No  Procedures   MEDICATIONS ORDERED IN ED: Medications  iohexol (OMNIPAQUE) 300 MG/ML solution 100 mL (100 mLs Intravenous Contrast Given 03/24/23 1629)     IMPRESSION / MDM / ASSESSMENT AND PLAN / ED COURSE  I reviewed the triage vital signs and the nursing notes.                                 Differential diagnosis includes, but is not limited to, cholecystitis, choledocholithiasis, pancreatitis, gastritis, colitis, biliary colic, nephrolithiasis, pyelonephritis, appendicitis   Patient's presentation is most consistent with acute presentation with potential threat to life or bodily function.   Patient's diagnosis is consistent with right upper quadrant abdominal pain, likely biliary colic.  Patient  presents emergency department complaining of right upper quadrant abdominal pain.  He states that this has been intermittent over the last 4 months.  Feels like a tight squeezing sensation in his right upper quadrant.  Does possibly seem attributed to postprandial.  Patient states that he does eat a large amount of pork.  Patient states that he did have some elevated LFTs at his primary care though I cannot see these labs in the medical record.  Patient has reassuring labs here.  Does have an elevated white blood cell count without fever.  On review of patient's medical records he always has a component of elevated white blood cell count though this appears to be lower than typical.  Given his pain, I did evaluate the patient with imaging.  Appears like he has stable bilateral kidney stones that are not in the ureters.  Do not suspect that this is the source of patient's symptoms.  Given the description I suspect that he has biliary colic.  There is no evidence of cholecystitis, choledocholithiasis, pancreatitis.  As such we will try send control medications.  Follow-up with primary care or general surgery if this continues and he wishes to discuss electively removing his gallbladder.  Patient is agreeable with this plan..  Patient is given ED precautions to return to the ED for any worsening or new symptoms.     FINAL CLINICAL IMPRESSION(S) / ED DIAGNOSES   Final diagnoses:  RUQ abdominal pain     Rx / DC Orders   ED Discharge Orders          Ordered    dicyclomine (BENTYL) 10 MG capsule  3 times daily before meals & bedtime        03/24/23 1914             Note:  This document was prepared using Dragon voice recognition software and may include unintentional dictation errors.   Lanette [place] 03/24/23 1915    Pilar Jarvis, MD 03/25/23 2028

## 2023-03-24 NOTE — ED Triage Notes (Signed)
Pt comes via EMS from home with c/o belly pain. Pt states RUQ pain 4 months ago and was dx with elevated liver enzymes.   Pt states he wanted to come get checked bc belly pain started 2 days ago.   VSS

## 2023-04-27 ENCOUNTER — Other Ambulatory Visit: Payer: Self-pay | Admitting: Urology

## 2023-07-21 ENCOUNTER — Other Ambulatory Visit: Payer: Self-pay | Admitting: Urology

## 2023-07-21 DIAGNOSIS — N200 Calculus of kidney: Secondary | ICD-10-CM

## 2023-07-21 NOTE — Progress Notes (Deleted)
07/25/2023 9:13 AM   Mark Vang [birth year] [id number]  Referring provider: Armando Gang, FNP [address] [place],  [place] [address]  Urological history: 1. Nephrolithiasis - CT renal stone study (06/2021) - Bilateral nephrolithiasis - contrast CT (02/2023) -5 mm calculus in the right renal collecting system without hydro and a 2 mm calculus peripherally in the mid left renal collecting system  2. 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   HPI: Mark Vang is a 62 y.o. male with nephrolithiasis who presents today for yearly follow up.   Previous records reviewed.   He was seen at [facility] emergency room on March 24, 2023 for right upper quadrant pain.  A contrast CT was performed and noted a right ureteral stone located in the renal pelvis, but it was not associated with hydronephrosis.  He also had a small left renal calculi.  It is also noted that his right kidney is ptotic and malrotated.  His urinalysis indicated greater than 50 RBCs.  His serum creatinine was 0.97.  UA ***  KUB ***  PMH: Past Medical History:  Diagnosis Date   COPD (chronic obstructive pulmonary disease) (HCC)    Hypertension    Stroke [facility])     Surgical History: Past Surgical History:  Procedure Laterality Date   KNEE SURGERY     NASAL SINUS SURGERY      Home Medications:  Allergies as of 07/25/2023       Reactions   Amlodipine Besy-benazepril Hcl Other (See Comments)   Scales, burning, itching, rash   Amlodipine    Hydrochlorothiazide         Medication List        Accurate as of July 21, 2023  9:13 AM. If you have any questions, ask your nurse or doctor.          aspirin EC 325 MG tablet Take 325 mg by mouth daily.   atorvastatin 40 MG tablet Commonly known as: LIPITOR Take 40 mg by mouth daily.   busPIRone 30 MG tablet Commonly known as: BUSPAR Take 30 mg by  mouth 2 (two) times daily.   cholecalciferol 1000 units tablet Commonly known as: VITAMIN D Take 50,000 Units by mouth once a week.   clonazePAM 0.5 MG tablet Commonly known as: KLONOPIN Take 0.5 mg by mouth daily as needed.   cyclobenzaprine 10 MG tablet Commonly known as: FLEXERIL Take 10 mg by mouth 3 (three) times daily as needed for muscle spasms.   Daliresp 250 MCG Tabs Generic drug: Roflumilast Take by mouth in the morning and at bedtime.   Daliresp 500 MCG Tabs tablet Generic drug: roflumilast Take by mouth.   dicyclomine 10 MG capsule Commonly known as: BENTYL Take 1 capsule (10 mg total) by mouth 4 (four) times daily -  before meals and at bedtime.   Emgality 120 MG/ML Soaj Generic drug: Galcanezumab-gnlm Inject 120 mg into the skin every 28 (twenty-eight) days.   fenofibrate 145 MG tablet Commonly known as: TRICOR Take 145 mg by mouth daily.   fluticasone 50 MCG/ACT nasal spray Commonly known as: FLONASE Place 1 spray into both nostrils daily.   Fluticasone-Umeclidin-Vilant 100-62.5-25 MCG/INH Aepb Inhale into the lungs.   gabapentin 300 MG capsule Commonly known as: NEURONTIN Take 300 mg by mouth 3 (three) times daily.   lisinopril 20 MG tablet Commonly known as: ZESTRIL Take 20 mg by mouth daily.  multivitamin tablet Take 1 tablet by mouth daily.   nortriptyline 10 MG capsule Commonly known as: PAMELOR Take by mouth.   pantoprazole 40 MG tablet Commonly known as: PROTONIX Take 1 tablet by mouth daily.   potassium chloride 10 MEQ tablet Commonly known as: KLOR-CON Take 10 mEq by mouth daily.   Stiolto Respimat 2.5-2.5 MCG/ACT Aers Generic drug: Tiotropium Bromide-Olodaterol Inhale into the lungs.   SUMAtriptan 100 MG tablet Commonly known as: IMITREX Take 1/2-1 tab at headache onset. Can repeat once in 2 hours if needed.  No more than 2 doses in 24 hours   tamsulosin 0.4 MG Caps capsule Commonly known as: FLOMAX TAKE 1 CAPSULE BY  MOUTH DAILY        Allergies:  Allergies  Allergen Reactions   Amlodipine Besy-Benazepril Hcl Other (See Comments)    Scales, burning, itching, rash   Amlodipine    Hydrochlorothiazide     Family History: Family History  Problem Relation Age of Onset   Cancer Mother    Cancer Father    Bladder Cancer Neg Hx    Kidney cancer Neg Hx    Prostate cancer Neg Hx     Social History:  reports that he has been smoking cigarettes. He has never used smokeless tobacco. He reports that he does not drink alcohol and does not use drugs.  ROS: For pertinent review of systems please refer to history of present illness  Physical Exam: There were no vitals taken for this visit.  Constitutional:  Well nourished. Alert and oriented, No acute distress. HEENT: [place] AT, moist mucus membranes.  Trachea midline, no masses. Cardiovascular: No clubbing, cyanosis, or edema. Respiratory: Normal respiratory effort, no increased work of breathing. GI: Abdomen is soft, non tender, non distended, no abdominal masses. Liver and spleen not palpable.  No hernias appreciated.  Stool sample for occult testing is not indicated.   GU: No CVA tenderness.  No bladder fullness or masses.  Patient with circumcised/uncircumcised phallus. ***Foreskin easily retracted***  Urethral meatus is patent.  No penile discharge. No penile lesions or rashes. Scrotum without lesions, cysts, rashes and/or edema.  Testicles are located scrotally bilaterally. No masses are appreciated in the testicles. Left and right epididymis are normal. Rectal: Patient with  normal sphincter tone. Anus and perineum without scarring or rashes. No rectal masses are appreciated. Prostate is approximately *** grams, *** nodules are appreciated. Seminal vesicles are normal. Skin: No rashes, bruises or suspicious lesions. Lymph: No cervical or inguinal adenopathy. Neurologic: Grossly intact, no focal deficits, moving all 4 extremities. Psychiatric: Normal  mood and affect.   Laboratory Data: Results for orders placed or performed during the hospital encounter of 03/24/23  Lipase, blood  Result Value Ref Range   Lipase 33 11 - 51 U/L  Comprehensive metabolic panel  Result Value Ref Range   Sodium 131 (L) 135 - 145 mmol/L   Potassium 4.2 3.5 - 5.1 mmol/L   Chloride 101 98 - 111 mmol/L   CO2 19 (L) 22 - 32 mmol/L   Glucose, Bld 110 (H) 70 - 99 mg/dL   BUN 23 8 - 23 mg/dL   Creatinine, Ser 1.91 0.61 - 1.24 mg/dL   Calcium 9.4 8.9 - 47.8 mg/dL   Total Protein 7.9 6.5 - 8.1 g/dL   Albumin 4.7 3.5 - 5.0 g/dL   AST 41 15 - 41 U/L   ALT 74 (H) 0 - 44 U/L   Alkaline Phosphatase 71 38 - 126 U/L   Total  Bilirubin 0.4 0.3 - 1.2 mg/dL   GFR, Estimated >69 >62 mL/min   Anion gap 11 5 - 15  CBC  Result Value Ref Range   WBC 14.9 (H) 4.0 - 10.5 K/uL   RBC 5.34 4.22 - 5.81 MIL/uL   Hemoglobin 14.9 13.0 - 17.0 g/dL   HCT 95.2 84.1 - 32.4 %   MCV 88.2 80.0 - 100.0 fL   MCH 27.9 26.0 - 34.0 pg   MCHC 31.6 30.0 - 36.0 g/dL   RDW 40.1 02.7 - 25.3 %   Platelets 351 150 - 400 K/uL   nRBC 0.0 0.0 - 0.2 %  Urinalysis, Routine w reflex microscopic -Urine, Random  Result Value Ref Range   Color, Urine YELLOW (A) YELLOW   APPearance CLEAR (A) CLEAR   Specific Gravity, Urine 1.017 1.005 - 1.030   pH 5.0 5.0 - 8.0   Glucose, UA NEGATIVE NEGATIVE mg/dL   Hgb urine dipstick MODERATE (A) NEGATIVE   Bilirubin Urine NEGATIVE NEGATIVE   Ketones, ur NEGATIVE NEGATIVE mg/dL   Protein, ur NEGATIVE NEGATIVE mg/dL   Nitrite NEGATIVE NEGATIVE   Leukocytes,Ua NEGATIVE NEGATIVE   RBC / HPF >50 0 - 5 RBC/hpf   WBC, UA 0-5 0 - 5 WBC/hpf   Bacteria, UA NONE SEEN NONE SEEN   Squamous Epithelial / HPF 0-5 0 - 5 /HPF   Mucus PRESENT    Sperm, UA PRESENT     Urinalysis: See EPIC and HPI I have reviewed the labs.  See HPI.      Pertinent Imaging: CLINICAL DATA:  Acute abdominal pain   EXAM: CT ABDOMEN AND PELVIS WITH CONTRAST   TECHNIQUE: Multidetector  CT imaging of the abdomen and pelvis was performed using the standard protocol following bolus administration of intravenous contrast.   RADIATION DOSE REDUCTION: This exam was performed according to the departmental dose-optimization program which includes automated exposure control, adjustment of the mA and/or kV according to patient size and/or use of iterative reconstruction technique.   CONTRAST:  OMNIPAQUE IOHEXOL 300 MG/ML  SOLN   COMPARISON:  07/08/2021   FINDINGS: Lower chest: No pleural or pericardial effusion.   Hepatobiliary: No focal liver abnormality is seen. No gallstones, gallbladder wall thickening, or biliary dilatation.   Pancreas: Unremarkable. No pancreatic ductal dilatation or surrounding inflammatory changes.   Spleen: Normal in size without focal abnormality.   Adrenals/Urinary Tract: No adrenal mass. Symmetric renal enhancement without focal lesion. 5 mm calculus centrally in the right renal collecting system without hydronephrosis. 2 mm calculus peripherally in the mid left renal collecting system. No hydronephrosis. 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 Urinary bladder physiologically distended.   Stomach/Bowel: Stomach partially distended, without acute finding. Small bowel decompressed. Normal appendix. Colon partially distended by gas and fecal material, unremarkable.   Vascular/Lymphatic: Moderate aortoiliac calcified atheromatous plaque without aneurysm. Portal vein patent. No abdominal or pelvic adenopathy.   Reproductive: Prostate is unremarkable.   Other: Bilateral pelvic phleboliths.  No ascites.  No free air.   Musculoskeletal: Facet and disc degenerative change L5-S1. No fracture or worrisome bone lesion.   IMPRESSION: 1. No acute findings. 2. Bilateral nephrolithiasis without hydronephrosis. 3.  Aortic Atherosclerosis (ICD10-I70.0).     Electronically Signed   By: Corlis Leak M.D.   On: 03/24/2023 16:49 I have  independently reviewed the films.  See HPI.   Assessment & Plan:    1. Bilateral nephrolithiasis -UA *** -KUB ***  2. Right ptotic malrotated kidney -can be more of  a risk for stone formation  3. Microscopic hematuria -UA > 50 RBC's in the ED in 02/2023    No follow-ups on file.  These notes generated with voice recognition software. I apologize for typographical errors.  Cloretta Ned  [facility] [address]  [address] [place], [place] [address] [phone]

## 2023-07-25 ENCOUNTER — Ambulatory Visit: Payer: Medicaid Other | Admitting: Urology

## 2023-07-25 DIAGNOSIS — Q632 Ectopic kidney: Secondary | ICD-10-CM

## 2023-07-25 DIAGNOSIS — N200 Calculus of kidney: Secondary | ICD-10-CM

## 2023-07-25 DIAGNOSIS — R3129 Other microscopic hematuria: Secondary | ICD-10-CM

## 2023-07-25 NOTE — Progress Notes (Signed)
07/28/2023 9:39 AM   Mark Vang 07/20/1961 [id number]  Referring provider: Armando Gang, FNP [address] [place],  [place] [address]  Urological history: 1. Nephrolithiasis - CT renal stone study (06/2021) - Bilateral nephrolithiasis - contrast CT (02/2023) -5 mm calculus in the right renal collecting system without hydro and a 2 mm calculus peripherally in the mid left renal collecting system  2. 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 male with nephrolithiasis who presents today for yearly follow up.   Previous records reviewed.   He was seen at [facility] emergency room on March 24, 2023 for right upper quadrant pain.  A contrast CT was performed and noted a right ureteral stone located in the renal pelvis, but it was not associated with hydronephrosis.  He also had a small left renal calculi.  It is also noted that his right kidney is ptotic and malrotated.  His urinalysis indicated greater than 50 RBCs.  His serum creatinine was 0.97.  He  has not pain today.  Patient denies any modifying or aggravating factors.  Patient denies any recent UTI's, gross hematuria, dysuria or suprapubic/flank pain.  Patient denies any fevers, chills, nausea or vomiting.    UA clear  KUB no stones seen, but right side is overlayed with stool  PMH: Past Medical History:  Diagnosis Date   COPD (chronic obstructive pulmonary disease) (HCC)    Hypertension    Stroke [facility])     Surgical History: Past Surgical History:  Procedure Laterality Date   KNEE SURGERY     NASAL SINUS SURGERY      Home Medications:  Allergies as of 07/28/2023       Reactions   Amlodipine Besy-benazepril Hcl Other (See Comments)   Scales, burning, itching, rash   Amlodipine    Hydrochlorothiazide         Medication List        Accurate as of July 28, 2023  9:39 AM. If you have any  questions, ask your nurse or doctor.          amLODipine 5 MG tablet Commonly known as: NORVASC   aspirin EC 325 MG tablet Take 325 mg by mouth daily.   atorvastatin 40 MG tablet Commonly known as: LIPITOR Take 40 mg by mouth daily.   baclofen 10 MG tablet Commonly known as: LIORESAL TAKE 1-2 TABLETS (10-20 MG TOTAL) BY MOUTH EVERY 6 (SIX) HOURS AS NEEDED FOR MUSCLE SPASMS.   buPROPion 150 MG 12 hr tablet Commonly known as: WELLBUTRIN SR   busPIRone 30 MG tablet Commonly known as: BUSPAR Take 30 mg by mouth 2 (two) times daily.   celecoxib 200 MG capsule Commonly known as: CELEBREX TAKE ONE (1) CAPSULE BY MOUTH 2 TIMES DAILY   Cetirizine HCl 10 MG Caps Take by oral route.   cholecalciferol 1000 units tablet Commonly known as: VITAMIN D Take 50,000 Units by mouth once a week.   clonazePAM 0.5 MG tablet Commonly known as: KLONOPIN Take 0.5 mg by mouth daily as needed.   Crestor 20 MG tablet Generic drug: rosuvastatin Take 1 tablet every day by oral route.   cyclobenzaprine 10 MG tablet Commonly known as: FLEXERIL Take 10 mg by mouth 3 (three) times daily as needed for muscle spasms.   Daliresp 250 MCG Tabs Generic drug: Roflumilast Take by mouth in the morning and at bedtime.   Daliresp  500 MCG Tabs tablet Generic drug: roflumilast Take by mouth.   Diclofenac Sodium CR 100 MG 24 hr tablet Take 100 mg by mouth daily.   diclofenac 75 MG EC tablet Commonly known as: VOLTAREN Take 75 mg by mouth 2 (two) times daily.   dicyclomine 10 MG capsule Commonly known as: BENTYL Take 1 capsule (10 mg total) by mouth 4 (four) times daily -  before meals and at bedtime.   Emgality 120 MG/ML Soaj Generic drug: Galcanezumab-gnlm Inject 120 mg into the skin every 28 (twenty-eight) days.   fenofibrate 145 MG tablet Commonly known as: TRICOR Take 145 mg by mouth daily.   fluticasone 50 MCG/ACT nasal spray Commonly known as: FLONASE Place 1 spray into both  nostrils daily.   Fluticasone-Umeclidin-Vilant 100-62.5-25 MCG/INH Aepb Inhale into the lungs.   gabapentin 300 MG capsule Commonly known as: NEURONTIN Take 300 mg by mouth 3 (three) times daily.   hydrochlorothiazide 25 MG tablet Commonly known as: HYDRODIURIL Take 1 tablet by mouth daily.   levofloxacin 750 MG tablet Commonly known as: LEVAQUIN Take 1 tablet by mouth daily.   lisinopril 20 MG tablet Commonly known as: ZESTRIL Take 20 mg by mouth daily.   metFORMIN 500 MG tablet Commonly known as: GLUCOPHAGE Take 500 mg by mouth 2 (two) times daily.   multivitamin tablet Take 1 tablet by mouth daily.   nortriptyline 10 MG capsule Commonly known as: PAMELOR Take by mouth.   pantoprazole 40 MG tablet Commonly known as: PROTONIX Take 1 tablet by mouth daily.   PARoxetine 10 MG tablet Commonly known as: PAXIL Take 10 mg by mouth daily.   potassium chloride 10 MEQ tablet Commonly known as: KLOR-CON Take 10 mEq by mouth daily.   predniSONE 5 MG tablet Commonly known as: DELTASONE   Stiolto Respimat 2.5-2.5 MCG/ACT Aers Generic drug: Tiotropium Bromide-Olodaterol Inhale into the lungs.   SUMAtriptan 100 MG tablet Commonly known as: IMITREX Take 1/2-1 tab at headache onset. Can repeat once in 2 hours if needed.  No more than 2 doses in 24 hours   tamsulosin 0.4 MG Caps capsule Commonly known as: FLOMAX TAKE 1 CAPSULE BY MOUTH DAILY   traMADol 50 MG tablet Commonly known as: ULTRAM Take One tab PO Q6 hours PRN pain   Varenicline Tartrate (Starter) 0.5 MG X 11 & 1 MG X 42 Tbpk Take by mouth.        Allergies:  Allergies  Allergen Reactions   Amlodipine Besy-Benazepril Hcl Other (See Comments)    Scales, burning, itching, rash   Amlodipine    Hydrochlorothiazide     Family History: Family History  Problem Relation Age of Onset   Cancer Mother    Cancer Father    Bladder Cancer Neg Hx    Kidney cancer Neg Hx    Prostate cancer Neg Hx      Social History:  reports that he has been smoking cigarettes. He has never used smokeless tobacco. He reports that he does not drink alcohol and does not use drugs.  ROS: For pertinent review of systems please refer to history of present illness  Physical Exam: BP 103/64   Pulse 77   Constitutional:  Well nourished. Alert and oriented, No acute distress. HEENT: [place] AT, moist mucus membranes.  Trachea midline Cardiovascular: No clubbing, cyanosis, or edema. Respiratory: Normal respiratory effort, no increased work of breathing. Neurologic: Grossly intact, no focal deficits, moving all 4 extremities. Psychiatric: Normal mood and affect.   Laboratory Data: Results  for orders placed or performed during the hospital encounter of 03/24/23  Lipase, blood  Result Value Ref Range   Lipase 33 11 - 51 U/L  Comprehensive metabolic panel  Result Value Ref Range   Sodium 131 (L) 135 - 145 mmol/L   Potassium 4.2 3.5 - 5.1 mmol/L   Chloride 101 98 - 111 mmol/L   CO2 19 (L) 22 - 32 mmol/L   Glucose, Bld 110 (H) 70 - 99 mg/dL   BUN 23 8 - 23 mg/dL   Creatinine, Ser 4.69 0.61 - 1.24 mg/dL   Calcium 9.4 8.9 - 62.9 mg/dL   Total Protein 7.9 6.5 - 8.1 g/dL   Albumin 4.7 3.5 - 5.0 g/dL   AST 41 15 - 41 U/L   ALT 74 (H) 0 - 44 U/L   Alkaline Phosphatase 71 38 - 126 U/L   Total Bilirubin 0.4 0.3 - 1.2 mg/dL   GFR, Estimated >52 >84 mL/min   Anion gap 11 5 - 15  CBC  Result Value Ref Range   WBC 14.9 (H) 4.0 - 10.5 K/uL   RBC 5.34 4.22 - 5.81 MIL/uL   Hemoglobin 14.9 13.0 - 17.0 g/dL   HCT 13.2 44.0 - 10.2 %   MCV 88.2 80.0 - 100.0 fL   MCH 27.9 26.0 - 34.0 pg   MCHC 31.6 30.0 - 36.0 g/dL   RDW 72.5 36.6 - 44.0 %   Platelets 351 150 - 400 K/uL   nRBC 0.0 0.0 - 0.2 %  Urinalysis, Routine w reflex microscopic -Urine, Random  Result Value Ref Range   Color, Urine YELLOW (A) YELLOW   APPearance CLEAR (A) CLEAR   Specific Gravity, Urine 1.017 1.005 - 1.030   pH 5.0 5.0 - 8.0   Glucose,  UA NEGATIVE NEGATIVE mg/dL   Hgb urine dipstick MODERATE (A) NEGATIVE   Bilirubin Urine NEGATIVE NEGATIVE   Ketones, ur NEGATIVE NEGATIVE mg/dL   Protein, ur NEGATIVE NEGATIVE mg/dL   Nitrite NEGATIVE NEGATIVE   Leukocytes,Ua NEGATIVE NEGATIVE   RBC / HPF >50 0 - 5 RBC/hpf   WBC, UA 0-5 0 - 5 WBC/hpf   Bacteria, UA NONE SEEN NONE SEEN   Squamous Epithelial / HPF 0-5 0 - 5 /HPF   Mucus PRESENT    Sperm, UA PRESENT     Urinalysis: See EPIC and HPI I have reviewed the labs.  See HPI.      Pertinent Imaging: CLINICAL DATA:  Acute abdominal pain   EXAM: CT ABDOMEN AND PELVIS WITH CONTRAST   TECHNIQUE: Multidetector CT imaging of the abdomen and pelvis was performed using the standard protocol following bolus administration of intravenous contrast.   RADIATION DOSE REDUCTION: This exam was performed according to the departmental dose-optimization program which includes automated exposure control, adjustment of the mA and/or kV according to patient size and/or use of iterative reconstruction technique.   CONTRAST:  OMNIPAQUE IOHEXOL 300 MG/ML  SOLN   COMPARISON:  07/08/2021   FINDINGS: Lower chest: No pleural or pericardial effusion.   Hepatobiliary: No focal liver abnormality is seen. No gallstones, gallbladder wall thickening, or biliary dilatation.   Pancreas: Unremarkable. No pancreatic ductal dilatation or surrounding inflammatory changes.   Spleen: Normal in size without focal abnormality.   Adrenals/Urinary Tract: No adrenal mass. Symmetric renal enhancement without focal lesion. 5 mm calculus centrally in the right renal collecting system without hydronephrosis. 2 mm calculus peripherally in the mid left renal collecting system. No hydronephrosis. Right kidney is ptotic  with malrotation. Urinary bladder physiologically distended.   Stomach/Bowel: Stomach partially distended, without acute finding. Small bowel decompressed. Normal appendix. Colon  partially distended by gas and fecal material, unremarkable.   Vascular/Lymphatic: Moderate aortoiliac calcified atheromatous plaque without aneurysm. Portal vein patent. No abdominal or pelvic adenopathy.   Reproductive: Prostate is unremarkable.   Other: Bilateral pelvic phleboliths.  No ascites.  No free air.   Musculoskeletal: Facet and disc degenerative change L5-S1. No fracture or worrisome bone lesion.   IMPRESSION: 1. No acute findings. 2. Bilateral nephrolithiasis without hydronephrosis. 3.  Aortic Atherosclerosis (ICD10-I70.0).     Electronically Signed   By: Corlis Leak M.D.   On: 03/24/2023 16:49  KUB no stones seen, radiologist interpretation still pending  I have independently reviewed the films.  See HPI.   Assessment & Plan:    1. Bilateral nephrolithiasis -UA negative -KUB no definite stone seen -continue tamsulosin 0.4 mg daily  2. Right ptotic malrotated kidney -can be more of a risk for stone formation  3. Microscopic hematuria -UA > 50 RBC's in the ED in 02/2023  -today's UA clear    Return in about 1 year (around 07/27/2024) for KUB and office visit .  These notes generated with voice recognition software. I apologize for typographical errors.  Cloretta Ned  [facility] [address]  [address] [place], [place] [address] [phone]

## 2023-07-28 ENCOUNTER — Ambulatory Visit
Admission: RE | Admit: 2023-07-28 | Discharge: 2023-07-28 | Disposition: A | Discharge status: Home / Self Care | Payer: Medicaid Other | Attending: Urology | Admitting: Urology

## 2023-07-28 ENCOUNTER — Ambulatory Visit: Payer: Medicaid Other | Admitting: Urology

## 2023-07-28 ENCOUNTER — Encounter: Payer: Self-pay | Admitting: Urology

## 2023-07-28 ENCOUNTER — Ambulatory Visit
Admission: RE | Admit: 2023-07-28 | Discharge: 2023-07-28 | Disposition: A | Discharge status: Home / Self Care | Payer: Medicaid Other | Source: Ambulatory Visit | Attending: Urology | Admitting: Urology

## 2023-07-28 VITALS — BP 103/64 | HR 77

## 2023-07-28 DIAGNOSIS — Z87898 Personal history of other specified conditions: Secondary | ICD-10-CM | POA: Diagnosis not present

## 2023-07-28 DIAGNOSIS — R3129 Other microscopic hematuria: Secondary | ICD-10-CM

## 2023-07-28 DIAGNOSIS — N200 Calculus of kidney: Secondary | ICD-10-CM

## 2023-07-28 DIAGNOSIS — Q632 Ectopic kidney: Secondary | ICD-10-CM

## 2023-07-28 DIAGNOSIS — Z87442 Personal history of urinary calculi: Secondary | ICD-10-CM

## 2023-07-28 LAB — MICROSCOPIC EXAMINATION
Bacteria, UA: NONE SEEN
Epithelial Cells (non renal): NONE SEEN /[HPF] (ref 0–10)

## 2023-07-28 LAB — URINALYSIS, COMPLETE
Bilirubin, UA: NEGATIVE
Glucose, UA: NEGATIVE
Ketones, UA: NEGATIVE
Leukocytes,UA: NEGATIVE
Nitrite, UA: NEGATIVE
Protein,UA: NEGATIVE
RBC, UA: NEGATIVE
Specific Gravity, UA: 1.01 (ref 1.005–1.030)
Urobilinogen, Ur: 0.2 mg/dL (ref 0.2–1.0)
pH, UA: 7 (ref 5.0–7.5)

## 2024-02-16 ENCOUNTER — Ambulatory Visit: Attending: Cardiovascular Disease | Admitting: Cardiovascular Disease

## 2024-02-16 ENCOUNTER — Encounter: Payer: Self-pay | Admitting: Cardiovascular Disease

## 2024-02-16 VITALS — BP 138/70 | HR 80 | Ht 72.0 in | Wt 231.0 lb

## 2024-02-16 DIAGNOSIS — R072 Precordial pain: Secondary | ICD-10-CM

## 2024-02-16 DIAGNOSIS — I639 Cerebral infarction, unspecified: Secondary | ICD-10-CM | POA: Diagnosis not present

## 2024-02-16 DIAGNOSIS — R0789 Other chest pain: Secondary | ICD-10-CM

## 2024-02-16 DIAGNOSIS — I10 Essential (primary) hypertension: Secondary | ICD-10-CM | POA: Diagnosis not present

## 2024-02-16 DIAGNOSIS — R0602 Shortness of breath: Secondary | ICD-10-CM | POA: Diagnosis not present

## 2024-02-16 DIAGNOSIS — Z79899 Other long term (current) drug therapy: Secondary | ICD-10-CM

## 2024-02-16 MED ORDER — METOPROLOL TARTRATE 100 MG PO TABS: 100.0000 mg | ORAL_TABLET | Freq: Once | ORAL | 0 refills | Status: AC

## 2024-02-16 NOTE — Patient Instructions (Addendum)
 Medication Instructions:   No changes  Please take Metoprolol  Tartrate 100 MG once 2 hours prior to Cardiac CT.  If you need a refill on your cardiac medications before your next appointment, please call your pharmacy.   Lab work: Your provider would like for you to have following labs drawn today BMP.    Testing/Procedures:   Your cardiac CT will be scheduled at:   [facility] [address] [place], [place] [address] [phone]  There is spacious parking and easy access to the radiology department from the [facility] entrance. Please enter here and check-in with the desk attendant.    Please follow these instructions carefully (unless otherwise directed):  An IV will be required for this test and Nitroglycerin will be given.  Hold all erectile dysfunction medications at least 3 days (72 hrs) prior to test. (Ie viagra, cialis, sildenafil, tadalafil, etc)   On the Night Before the Test: Be sure to Drink plenty of water. Do not consume any caffeinated/decaffeinated beverages or chocolate 12 hours prior to your test. Do not take any antihistamines 12 hours prior to your test.   On the Day of the Test: Drink plenty of water until 1 hour prior to the test. Do not eat any food 1 hour prior to test. You may take your regular medications prior to the test.  Take metoprolol  tartrate (Lopressor ) 100 MG two hours prior to test. If you take Furosemide/Hydrochlorothiazide/Spironolactone/Chlorthalidone, please HOLD on the morning of the test. Patients who wear a continuous glucose monitor MUST remove the device prior to scanning. FEMALES- please wear underwire-free bra if available, avoid dresses & tight clothing  After the Test: Drink plenty of water. After receiving IV contrast, you may experience a mild flushed feeling. This is normal. On occasion, you may experience a mild rash up to 24 hours after the test. This is not dangerous. If this  occurs, you can take Benadryl 25 mg, Zyrtec, Claritin, or Allegra and increase your fluid intake. (Patients taking Tikosyn should avoid Benadryl, and may take Zyrtec, Claritin, or Allegra) If you experience trouble breathing, this can be serious. If it is severe call 911 IMMEDIATELY. If it is mild, please call our office.  We will call to schedule your test 2-4 weeks out understanding that some insurance companies will need an authorization prior to the service being performed.   For more information and frequently asked questions, please visit our website : [email or web address]  For non-scheduling related questions, please contact the cardiac imaging nurse navigator should you have any questions/concerns: Cardiac Imaging Nurse Navigators Direct Office Dial: [phone]   For scheduling needs, including cancellations and rescheduling, please call [place], [phone].   Follow-Up: At [facility], you and your health needs are our priority.  As part of our continuing mission to provide you with exceptional heart care, we have created designated Provider Care Teams.  These Care Teams include your primary Cardiologist (physician) and Advanced Practice Providers (APPs -  Physician Assistants and Nurse Practitioners) who all work together to provide you with the care you need, when you need it.  You will need a follow up appointment as needed  Providers on your designated Care Team:   Laneta Pintos, NP Varney Gentleman, PA-C Cadence Gennaro Khat, [place]  COVID-19 Vaccine Information can be found at: [email or web address] For questions related to vaccine distribution or appointments, please email [email or web address] or call [phone].

## 2024-02-16 NOTE — Progress Notes (Signed)
 Cardiology Office Note  Date:  02/16/2024   ID:  Mark Vang, DOB October 19, 1960, MRN [id number]  PCP:  Sharyne Degree, FNP   Chief Complaint  Patient presents with   New Patient (Initial Visit)    Ref by Rueben Cote, FNP for Cardiomegaly. Patient c/o fluttering in chest, chest discomfort comes and goes & has shortness of breath most days; has COPD.     HPI:  Mark Vang is a 63 year old gentleman with past medical history of Chronic pain, neck pain Kidney stones Aortic atherosclerosis Diabetes type 2 PAD, mild carotid stenosis Referred by Rueben Cote for cardiomegaly, chest pain  Office records from primary care reviewed Echo not available at the time of our discussion in the office today  He reports that echo detailed 4 cm aorta He is having episodes of chest pain at times, concerning for angina He continues to smoke 1 pack/day  CT scan abdomen pelvis June 2024 images pulled up Showing moderate diffuse aortic atherosclerosis  Reports having allergy to amlodipine, hydrochlorothiazide Only takes lisinopril 20 mg daily for blood pressure Concerned it might be running too high  EKG personally reviewed by myself on todays visit EKG Interpretation Date/Time:  Friday Feb 16 2024 15:21:49 EDT Ventricular Rate:  80 PR Interval:  156 QRS Duration:  84 QT Interval:  350 QTC Calculation: 403 R Axis:   1  Text Interpretation: Normal sinus rhythm Normal ECG When compared with ECG of 29-Mar-2021 16:51, No significant change was found Confirmed by Belva Boyden [phone]) on 02/16/2024 3:37:53 PM    PMH:   has a past medical history of Cardiomegaly, COPD (chronic obstructive pulmonary disease) (HCC), Hyperlipidemia, Hypertension, Sleep apnea, Stroke (HCC), Type II diabetes mellitus (HCC), and Vitamin D  deficiency.  PSH:    Past Surgical History:  Procedure Laterality Date   KNEE SURGERY     NASAL SINUS SURGERY      Current Outpatient Medications  Medication  Sig Dispense Refill   albuterol  (VENTOLIN  HFA) 108 (90 Base) MCG/ACT inhaler Inhale 1-2 puffs into the lungs every 6 (six) hours as needed.     amLODipine (NORVASC) 5 MG tablet Take 5 mg by mouth daily.     aspirin  325 MG EC tablet Take 325 mg by mouth daily.     atorvastatin (LIPITOR) 40 MG tablet Take 40 mg by mouth daily.     buPROPion (WELLBUTRIN SR) 150 MG 12 hr tablet Take 150 mg by mouth daily.     busPIRone (BUSPAR) 30 MG tablet Take 30 mg by mouth 2 (two) times daily.     Cetirizine HCl 10 MG CAPS Take by oral route.     clonazePAM (KLONOPIN) 0.5 MG tablet Take 0.5 mg by mouth daily as needed.     cyclobenzaprine  (FLEXERIL ) 10 MG tablet Take 10 mg by mouth 3 (three) times daily as needed for muscle spasms.     diclofenac (VOLTAREN) 75 MG EC tablet Take 75 mg by mouth 2 (two) times daily.     fenofibrate (TRICOR) 145 MG tablet Take 145 mg by mouth daily.     fluticasone (FLONASE) 50 MCG/ACT nasal spray Place 1 spray into both nostrils daily.      Fluticasone-Umeclidin-Vilant 100-62.5-25 MCG/INH AEPB Inhale into the lungs.     gabapentin  (NEURONTIN ) 300 MG capsule Take 300 mg by mouth 3 (three) times daily.     ipratropium-albuterol  (DUONEB) 0.5-2.5 (3) MG/3ML SOLN Take 3 mLs by nebulization every 4 (four) hours as needed.     lisinopril (  ZESTRIL) 40 MG tablet Take 40 mg by mouth daily.     metFORMIN (GLUCOPHAGE) 500 MG tablet Take 500 mg by mouth 2 (two) times daily.     MOUNJARO 5 MG/0.5ML Pen Inject 5 mg into the skin once a week.     Multiple Vitamin (MULTIVITAMIN) tablet Take 1 tablet by mouth daily.     OXYGEN Inhale 2 L into the lungs at bedtime.     pantoprazole (PROTONIX) 40 MG tablet Take 1 tablet by mouth daily.  5   potassium chloride (KLOR-CON) 10 MEQ tablet Take 10 mEq by mouth daily.     tamsulosin  (FLOMAX ) 0.4 MG CAPS capsule TAKE 1 CAPSULE BY MOUTH DAILY 90 capsule 3   Tiotropium Bromide -Olodaterol (STIOLTO RESPIMAT) 2.5-2.5 MCG/ACT AERS Inhale into the lungs.      traMADol (ULTRAM) 50 MG tablet Take One tab PO Q6 hours PRN pain     Vitamin D , Ergocalciferol , (DRISDOL) 1.25 MG (50000 UNIT) CAPS capsule Take 50,000 Units by mouth every 7 (seven) days.     baclofen  (LIORESAL ) 10 MG tablet TAKE 1-2 TABLETS (10-20 MG TOTAL) BY MOUTH EVERY 6 (SIX) HOURS AS NEEDED FOR MUSCLE SPASMS. (Patient not taking: Reported on 02/16/2024)     celecoxib (CELEBREX) 200 MG capsule TAKE ONE (1) CAPSULE BY MOUTH 2 TIMES DAILY (Patient not taking: Reported on 02/16/2024)     Diclofenac Sodium CR 100 MG 24 hr tablet Take 100 mg by mouth daily. (Patient not taking: Reported on 02/16/2024)     dicyclomine  (BENTYL ) 10 MG capsule Take 1 capsule (10 mg total) by mouth 4 (four) times daily -  before meals and at bedtime. (Patient not taking: Reported on 02/16/2024) 30 capsule 1   Galcanezumab-gnlm (EMGALITY) 120 MG/ML SOAJ Inject 120 mg into the skin every 28 (twenty-eight) days. (Patient not taking: Reported on 02/16/2024)     levofloxacin  (LEVAQUIN ) 750 MG tablet Take 1 tablet by mouth daily. (Patient not taking: Reported on 02/16/2024)     nortriptyline (PAMELOR) 10 MG capsule Take by mouth. (Patient not taking: Reported on 02/16/2024)     PARoxetine (PAXIL) 10 MG tablet Take 10 mg by mouth daily. (Patient not taking: Reported on 02/16/2024)     predniSONE  (DELTASONE ) 5 MG tablet  (Patient not taking: Reported on 02/16/2024)     Roflumilast (DALIRESP) 250 MCG TABS Take by mouth in the morning and at bedtime. (Patient not taking: Reported on 02/16/2024)     roflumilast (DALIRESP) 500 MCG TABS tablet Take by mouth. (Patient not taking: Reported on 02/16/2024)     SUMAtriptan (IMITREX) 100 MG tablet Take 1/2-1 tab at headache onset. Can repeat once in 2 hours if needed.  No more than 2 doses in 24 hours (Patient not taking: Reported on 02/16/2024)     Varenicline Tartrate, Starter, 0.5 MG X 11 & 1 MG X 42 TBPK Take by mouth. (Patient not taking: Reported on 02/16/2024)     No current  facility-administered medications for this visit.     Allergies:   Amlodipine besy-benazepril hcl, Amlodipine, and Hydrochlorothiazide   Social History:  The patient  reports that he has been smoking cigarettes. He started smoking about 53 years ago. He has a 53 pack-year smoking history. He has never used smokeless tobacco. He reports that he does not drink alcohol and does not use drugs.   Family History:   family history includes Cancer in his father and mother.    Review of Systems: Review of Systems  Constitutional: Negative.  HENT: Negative.    Respiratory: Negative.    Cardiovascular:  Positive for chest pain.  Gastrointestinal: Negative.   Musculoskeletal: Negative.   Neurological: Negative.   Psychiatric/Behavioral: Negative.    All other systems reviewed and are negative.   PHYSICAL EXAM: VS:  BP 138/70 (BP Location: Left Arm, Patient Position: Sitting, Cuff Size: Normal)   Pulse 80   Ht 6' (1.829 m)   Wt 231 lb (104.8 kg)   SpO2 96%   BMI 31.33 kg/m  , BMI Body mass index is 31.33 kg/m. GEN: Well nourished, well developed, in no acute distress HEENT: normal Neck: no JVD, carotid bruits, or masses Cardiac: RRR; no murmurs, rubs, or gallops,no edema  Respiratory:  clear to auscultation bilaterally, normal work of breathing GI: soft, nontender, nondistended, + BS MS: no deformity or atrophy Skin: warm and dry, no rash Neuro:  Strength and sensation are intact Psych: euthymic mood, full affect  Recent Labs: 03/24/2023: ALT 74; BUN 23; Creatinine, Ser 0.97; Hemoglobin 14.9; Platelets 351; Potassium 4.2; Sodium 131    Lipid Panel Lab Results  Component Value Date   CHOL 143 10/02/2013   HDL 21 (L) 10/02/2013   LDLCALC 91 10/02/2013   TRIG 155 10/02/2013      Wt Readings from Last 3 Encounters:  02/16/24 231 lb (104.8 kg)  03/24/23 256 lb (116.1 kg)  07/21/22 247 lb (112 kg)     ASSESSMENT AND PLAN:  Problem List Items Addressed This Visit        Cardiology Problems   HTN (hypertension) - Primary   Relevant Medications   lisinopril (ZESTRIL) 40 MG tablet   Other Relevant Orders   EKG 12-Lead (Completed)   Stroke (HCC)   Relevant Medications   lisinopril (ZESTRIL) 40 MG tablet   Other Relevant Orders   EKG 12-Lead (Completed)   Other Visit Diagnoses       Shortness of breath       Relevant Orders   EKG 12-Lead (Completed)     Chest discomfort       Relevant Orders   EKG 12-Lead (Completed)      Chest pain/angina Moderate aortic atherosclerosis, long smoking history History of diabetes Exertional related chest pain,  In effort to rule out ischemia, cardiac CTA has been ordered Continue aspirin , statin, potentially could add beta-blocker and follow-up  Essential hypertension Reports having allergy to amlodipine and hydrochlorothiazide Currently on ACE inhibitor lisinopril 40 daily If blood pressure continues to run high could add beta-blocker such as carvedilol 6.25 twice daily up to 12.5 twice daily  Smoker We have encouraged him to continue to work on weaning his cigarettes and smoking cessation. He will continue to work on this and does not want any assistance with chantix.    Hyperlipidemia On Lipitor 40 daily, managed by primary care  Aortic atherosclerosis Images on CT pulled up and reviewed, Smoking cessation recommended   Signed, Juanda Noon, M.D., Ph.D. [facility] [place], [place] [phone]

## 2024-02-17 LAB — BASIC METABOLIC PANEL WITH GFR
BUN/Creatinine Ratio: 16 (ref 10–24)
BUN: 18 mg/dL (ref 8–27)
CO2: 16 mmol/L — ABNORMAL LOW (ref 20–29)
Calcium: 10.5 mg/dL — ABNORMAL HIGH (ref 8.6–10.2)
Chloride: 100 mmol/L (ref 96–106)
Creatinine, Ser: 1.15 mg/dL (ref 0.76–1.27)
Glucose: 86 mg/dL (ref 70–99)
Potassium: 4.7 mmol/L (ref 3.5–5.2)
Sodium: 137 mmol/L (ref 134–144)
eGFR: 71 mL/min/{1.73_m2} (ref >59)

## 2024-02-21 ENCOUNTER — Ambulatory Visit: Payer: Self-pay | Admitting: Emergency Medicine

## 2024-03-12 ENCOUNTER — Encounter (HOSPITAL_COMMUNITY): Payer: Self-pay

## 2024-03-13 ENCOUNTER — Telehealth (HOSPITAL_COMMUNITY): Payer: Self-pay | Admitting: Emergency Medicine

## 2024-03-13 NOTE — Telephone Encounter (Signed)
 Reaching out to patient to offer assistance regarding upcoming cardiac imaging study; pt verbalizes understanding of appt date/time, parking situation and where to check in, pre-test NPO status and medications ordered, and verified current allergies; name and call back number provided for further questions should they arise Rockwell Alexandria RN Navigator Cardiac Imaging [facility] [phone] office [phone] cell

## 2024-03-13 NOTE — Telephone Encounter (Signed)
 Unable to leave vm Rockwell Alexandria RN Navigator Cardiac Imaging [facility] [phone] Office  [phone] Cell

## 2024-03-14 ENCOUNTER — Ambulatory Visit
Admission: RE | Admit: 2024-03-14 | Discharge: 2024-03-14 | Disposition: A | Discharge status: Home / Self Care | Source: Ambulatory Visit | Attending: Cardiovascular Disease | Admitting: Cardiovascular Disease

## 2024-03-14 DIAGNOSIS — R072 Precordial pain: Secondary | ICD-10-CM | POA: Diagnosis not present

## 2024-03-14 MED ORDER — NITROGLYCERIN 0.4 MG SL SUBL
0.8000 mg | SUBLINGUAL_TABLET | Freq: Once | SUBLINGUAL | Status: AC
  Administered 2024-03-14: 0.8 mg via SUBLINGUAL
  Filled 2024-03-14: qty 25

## 2024-03-14 MED ORDER — IOHEXOL 350 MG/ML SOLN
80.0000 mL | Freq: Once | INTRAVENOUS | Status: AC | PRN
  Administered 2024-03-14: 80 mL via INTRAVENOUS

## 2024-03-14 NOTE — Progress Notes (Signed)
 Patient tolerated CT well. Vital signs stable encourage to drink water throughout day.Reasons explained and verbalized understanding. Ambulated steady gait.

## 2024-03-18 ENCOUNTER — Encounter: Payer: Self-pay | Admitting: Emergency Medicine

## 2024-04-03 ENCOUNTER — Other Ambulatory Visit: Payer: Self-pay | Admitting: Urology

## 2024-07-22 ENCOUNTER — Other Ambulatory Visit: Payer: Self-pay

## 2024-07-22 DIAGNOSIS — N200 Calculus of kidney: Secondary | ICD-10-CM

## 2024-07-24 ENCOUNTER — Ambulatory Visit: Admitting: Pain Medicine

## 2024-07-24 NOTE — Progress Notes (Signed)
 07/26/2024 9:58 AM   Mark Vang 16-Nov-1960 [id number]  Referring provider: Donal Channing SQUIBB, FNP [address] [place],  [place] [address]  Urological history: 1. Nephrolithiasis - CT renal stone study (06/2021) - Bilateral nephrolithiasis - contrast CT (02/2023) -5 mm calculus in the right renal collecting system without hydro and a 2 mm calculus peripherally in the mid left renal collecting system  2. 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 male with nephrolithiasis who presents today for yearly follow up.   Previous records reviewed.   He thinks he may have passed a stone 3 to 4 months ago when he felt a burning sensation and a sharp pain in his penis and then it went away quickly.  He did not see a distinct fragment.  Patient denies any modifying or aggravating factors.  Patient denies any recent UTI's, gross hematuria, dysuria or suprapubic/flank pain.  Patient denies any fevers, chills, nausea or vomiting.    Serum creatinine (01/2024) 1.15, eGFR 71  UA yellow clear, specific area 1.015, pH 6.0, 1+ heme, 0-5 WBCs, 11-30 RBCs, 0-10 epithelial cells.  KUB no calculus seen.   PMH: Past Medical History:  Diagnosis Date   Cardiomegaly    COPD (chronic obstructive pulmonary disease) (HCC)    Hyperlipidemia    Hypertension    Sleep apnea    Stroke (HCC)    Type II diabetes mellitus (HCC)    Vitamin D  deficiency     Surgical History: Past Surgical History:  Procedure Laterality Date   KNEE SURGERY     NASAL SINUS SURGERY      Home Medications:  Allergies as of 07/26/2024       Reactions   Amlodipine Besy-benazepril Hcl Other (See Comments)   Scales, burning, itching, rash   Amlodipine    Hydrochlorothiazide         Medication List        Accurate as of July 26, 2024  9:58 AM. If you have any questions, ask your nurse or doctor.           albuterol  108 (90 Base) MCG/ACT inhaler Commonly known as: VENTOLIN  HFA Inhale 1-2 puffs into the lungs every 6 (six) hours as needed.   aspirin  EC 325 MG tablet Take 325 mg by mouth daily.   atorvastatin 40 MG tablet Commonly known as: LIPITOR Take 40 mg by mouth daily.   baclofen  10 MG tablet Commonly known as: LIORESAL  TAKE 1-2 TABLETS (10-20 MG TOTAL) BY MOUTH EVERY 6 (SIX) HOURS AS NEEDED FOR MUSCLE SPASMS.   buPROPion 150 MG 12 hr tablet Commonly known as: WELLBUTRIN SR Take 150 mg by mouth daily.   busPIRone 30 MG tablet Commonly known as: BUSPAR Take 30 mg by mouth 2 (two) times daily.   celecoxib 200 MG capsule Commonly known as: CELEBREX TAKE ONE (1) CAPSULE BY MOUTH 2 TIMES DAILY   Cetirizine HCl 10 MG Caps Take by oral route.   clonazePAM 0.5 MG tablet Commonly known as: KLONOPIN Take 0.5 mg by mouth daily as needed.   cyclobenzaprine  10 MG tablet Commonly known as: FLEXERIL  Take 10 mg by mouth 3 (three) times daily as needed for muscle spasms.   Daliresp 250 MCG Tabs Generic drug: Roflumilast Take by mouth in the morning and at bedtime.   Daliresp 500 MCG Tabs tablet Generic drug: roflumilast Take by mouth.   Diclofenac Sodium CR  100 MG 24 hr tablet Take 100 mg by mouth daily.   diclofenac 75 MG EC tablet Commonly known as: VOLTAREN Take 75 mg by mouth 2 (two) times daily.   dicyclomine  10 MG capsule Commonly known as: BENTYL  Take 1 capsule (10 mg total) by mouth 4 (four) times daily -  before meals and at bedtime.   Emgality 120 MG/ML Soaj Generic drug: Galcanezumab-gnlm Inject 120 mg into the skin every 28 (twenty-eight) days.   fenofibrate 145 MG tablet Commonly known as: TRICOR Take 145 mg by mouth daily.   fluticasone 50 MCG/ACT nasal spray Commonly known as: FLONASE Place 1 spray into both nostrils daily.   Fluticasone-Umeclidin-Vilant 100-62.5-25 MCG/INH Aepb Inhale into the lungs.   gabapentin  300 MG  capsule Commonly known as: NEURONTIN  Take 300 mg by mouth 3 (three) times daily.   ipratropium-albuterol  0.5-2.5 (3) MG/3ML Soln Commonly known as: DUONEB Take 3 mLs by nebulization every 4 (four) hours as needed.   levofloxacin  750 MG tablet Commonly known as: LEVAQUIN  Take 1 tablet by mouth daily.   lisinopril 40 MG tablet Commonly known as: ZESTRIL Take 40 mg by mouth daily.   metFORMIN 500 MG tablet Commonly known as: GLUCOPHAGE Take 500 mg by mouth 2 (two) times daily.   metoprolol  tartrate 100 MG tablet Commonly known as: LOPRESSOR  Take 1 tablet (100 mg total) by mouth once for 1 dose.   Mounjaro 5 MG/0.5ML Pen Generic drug: tirzepatide Inject 5 mg into the skin once a week.   multivitamin tablet Take 1 tablet by mouth daily.   nortriptyline 10 MG capsule Commonly known as: PAMELOR Take by mouth.   OXYGEN Inhale 2 L into the lungs at bedtime.   pantoprazole 40 MG tablet Commonly known as: PROTONIX Take 1 tablet by mouth daily.   PARoxetine 10 MG tablet Commonly known as: PAXIL Take 10 mg by mouth daily.   potassium chloride 10 MEQ tablet Commonly known as: KLOR-CON Take 10 mEq by mouth daily.   predniSONE  5 MG tablet Commonly known as: DELTASONE    Stiolto Respimat 2.5-2.5 MCG/ACT Aers Generic drug: Tiotropium Bromide -Olodaterol Inhale into the lungs.   SUMAtriptan 100 MG tablet Commonly known as: IMITREX Take 1/2-1 tab at headache onset. Can repeat once in 2 hours if needed.  No more than 2 doses in 24 hours   tamsulosin  0.4 MG Caps capsule Commonly known as: FLOMAX  TAKE 1 CAPSULE BY MOUTH DAILY   traMADol 50 MG tablet Commonly known as: ULTRAM Take One tab PO Q6 hours PRN pain   Varenicline Tartrate (Starter) 0.5 MG X 11 & 1 MG X 42 Tbpk Take by mouth.   Vitamin D  (Ergocalciferol ) 1.25 MG (50000 UNIT) Caps capsule Commonly known as: DRISDOL Take 50,000 Units by mouth every 7 (seven) days.        Allergies:  Allergies  Allergen  Reactions   Amlodipine Besy-Benazepril Hcl Other (See Comments)    Scales, burning, itching, rash   Amlodipine    Hydrochlorothiazide     Family History: Family History  Problem Relation Age of Onset   Cancer Mother    Cancer Father    Bladder Cancer Neg Hx    Kidney cancer Neg Hx    Prostate cancer Neg Hx     Social History:  reports that he has been smoking cigarettes. He started smoking about 53 years ago. He has a 53.4 pack-year smoking history. He has never used smokeless tobacco. He reports that he does not drink alcohol and does not  use drugs.  ROS: For pertinent review of systems please refer to history of present illness  Physical Exam: BP 129/77   Pulse 74   Ht 6' (1.829 m)   Wt 215 lb (97.5 kg)   BMI 29.16 kg/m   Constitutional:  Well nourished. Alert and oriented, No acute distress. HEENT: [place] AT, moist mucus membranes.  Trachea midline Cardiovascular: No clubbing, cyanosis, or edema. Respiratory: Normal respiratory effort, no increased work of breathing. Neurologic: Grossly intact, no focal deficits, moving all 4 extremities. Psychiatric: Normal mood and affect.   Laboratory Data:  See EPIC and HPI I have reviewed the labs.     Pertinent Imaging: KUB no stones seen, radiologist interpretation still pending  I have independently reviewed the films.  See HPI.   Assessment & Plan:    1. Bilateral nephrolithiasis -no stones seen on today's KUB  2. Right ptotic malrotated kidney -can be more of a risk for stone formation  3. Microscopic hematuria - He has a 54-pack-year history of smoking and therefore at high risk for GU malignancies - we discussed that there are a number of causes that can be associated with blood in the urine, such as stones, BPH, UTI's, damage to the urinary tract and/or cancer.   Sometimes, we do not find a cause or source of the hematuria.   -we discussed that the recommended protocol for further work up are are CT urogram and  cysto   -we discussed that for a CT urogram a contrast material will be injected into a vein and that in rare instances, an allergic reaction can result and may even life threatening (1:100,000)  The patient denies any allergies to contrast, iodine and/or seafood. - we discussed that following the imaging study,  a cystoscopy is performed  -we discussed that a cystoscopy is a procedure that consists of passing a camera up their urethra after administering lidocaine  to anesthetize and that after the procedure a minor amount of blood in the urine and/or burning which usually resolves in 24 to 48 hours may occur  -the patient had the opportunity to ask questions which were answered. Based upon this discussion, the patient is willing to proceed. Therefore, I've ordered: a CT Urogram and cystoscopy  - The patient will return following all of the above for discussion of the results.  - UA 11-30 RBC's  Return for CT Urogram report and cystoscopy for micro heme .  These notes generated with voice recognition software. I apologize for typographical errors.  CLOTILDA HELON RIGGERS  [facility] [address]  [address] [place], [place] [address] [phone]

## 2024-07-26 ENCOUNTER — Encounter: Payer: Self-pay | Admitting: Urology

## 2024-07-26 ENCOUNTER — Ambulatory Visit
Admission: RE | Admit: 2024-07-26 | Discharge: 2024-07-26 | Disposition: A | Discharge status: Home / Self Care | Attending: Urology | Admitting: Urology

## 2024-07-26 ENCOUNTER — Ambulatory Visit: Payer: Self-pay | Admitting: Urology

## 2024-07-26 ENCOUNTER — Ambulatory Visit
Admission: RE | Admit: 2024-07-26 | Discharge: 2024-07-26 | Disposition: A | Discharge status: Home / Self Care | Source: Ambulatory Visit | Attending: Urology | Admitting: Urology

## 2024-07-26 VITALS — BP 129/77 | HR 74 | Ht 72.0 in | Wt 215.0 lb

## 2024-07-26 DIAGNOSIS — Q632 Ectopic kidney: Secondary | ICD-10-CM

## 2024-07-26 DIAGNOSIS — N200 Calculus of kidney: Secondary | ICD-10-CM

## 2024-07-26 DIAGNOSIS — R3129 Other microscopic hematuria: Secondary | ICD-10-CM

## 2024-07-26 LAB — MICROSCOPIC EXAMINATION: Bacteria, UA: NONE SEEN

## 2024-07-26 LAB — URINALYSIS, COMPLETE
Bilirubin, UA: NEGATIVE
Glucose, UA: NEGATIVE
Ketones, UA: NEGATIVE
Leukocytes,UA: NEGATIVE
Nitrite, UA: NEGATIVE
Protein,UA: NEGATIVE
Specific Gravity, UA: 1.015 (ref 1.005–1.030)
Urobilinogen, Ur: 0.2 mg/dL (ref 0.2–1.0)
pH, UA: 6 (ref 5.0–7.5)

## 2024-07-26 NOTE — Patient Instructions (Addendum)
 We have ordered a CT scan and the scheduling department should reach out to you to schedule this appointment.  Sometimes, they have difficulty reaching patients because they are calling from an unknown number and many people have their phones set to ignore unknown numbers.  They do try to leave messages, but sometimes voicemails are not set up or mailboxes are full.  If you have not heard from the scheduling department in a week, please call [phone] to schedule your CT scan   Cystoscopy Cystoscopy is a procedure that is used to help diagnose and sometimes treat conditions that affect the lower urinary tract. The lower urinary tract includes the bladder and the urethra. The urethra is the tube that drains urine from the bladder. Cystoscopy is done using a thin, tube-shaped instrument with a light and camera at the end (cystoscope). The cystoscope may be hard or flexible, depending on the goal of the procedure. The cystoscope is inserted through the urethra, into the bladder. Cystoscopy may be recommended if you have: Urinary tract infections that keep coming back. Blood in the urine (hematuria). An inability to control when you urinate (urinary incontinence) or an overactive bladder. Unusual cells found in a urine sample. A blockage in the urethra, such as a urinary stone. Painful urination. An abnormality in the bladder found during an intravenous pyelogram (IVP) or CT scan. What are the risks? Generally, this is a safe procedure. However, problems may occur, including: Infection. Bleeding.  What happens during the procedure?  You will be given one or more of the following: A medicine to numb the area (local anesthetic). The area around the opening of your urethra will be cleaned. The cystoscope will be passed through your urethra into your bladder. Germ-free (sterile) fluid will flow through the cystoscope to fill your bladder. The fluid will stretch your bladder so that your health care  provider can clearly examine your bladder walls. Your doctor will look at the urethra and bladder. The cystoscope will be removed The procedure may vary among health care providers  What can I expect after the procedure? After the procedure, it is common to have: Some soreness or pain in your urethra. Urinary symptoms. These include: Mild pain or burning when you urinate. Pain should stop within a few minutes after you urinate. This may last for up to a few days after the procedure. A small amount of blood in your urine for several days. Feeling like you need to urinate but producing only a small amount of urine. Follow these instructions at home: General instructions Return to your normal activities as told by your health care provider.  Drink plenty of fluids after the procedure. Keep all follow-up visits as told by your health care provider. This is important. Contact a health care provider if you: Have pain that gets worse or does not get better with medicine, especially pain when you urinate lasting longer than 72 hours after the procedure. Have trouble urinating. Get help right away if you: Have blood clots in your urine. Have a fever or chills. Are unable to urinate. Summary Cystoscopy is a procedure that is used to help diagnose and sometimes treat conditions that affect the lower urinary tract. Cystoscopy is done using a thin, tube-shaped instrument with a light and camera at the end. After the procedure, it is common to have some soreness or pain in your urethra. It is normal to have blood in your urine after the procedure.  If you were prescribed an antibiotic  medicine, take it as told by your health care provider.  This information is not intended to replace advice given to you by your health care provider. Make sure you discuss any questions you have with your health care provider. Document Revised: 09/04/2018 Document Reviewed: 09/04/2018 Elsevier Patient Education  2020  [email or web address]

## 2024-08-01 ENCOUNTER — Ambulatory Visit
Admission: RE | Admit: 2024-08-01 | Discharge: 2024-08-01 | Disposition: A | Discharge status: Home / Self Care | Source: Ambulatory Visit | Attending: Urology | Admitting: Urology

## 2024-08-01 DIAGNOSIS — R3129 Other microscopic hematuria: Secondary | ICD-10-CM | POA: Diagnosis present

## 2024-08-01 LAB — POCT I-STAT CREATININE: Creatinine, Ser: 1.1 mg/dL (ref 0.61–1.24)

## 2024-08-01 MED ORDER — IOHEXOL 300 MG/ML  SOLN
100.0000 mL | Freq: Once | INTRAMUSCULAR | Status: AC | PRN
  Administered 2024-08-01: 100 mL via INTRAVENOUS

## 2024-08-02 ENCOUNTER — Ambulatory Visit: Payer: Self-pay | Admitting: Urology

## 2024-08-06 NOTE — Telephone Encounter (Signed)
 PT called asking about his test results, I'm assuming he means his CT scan that [facility] ordered.

## 2024-10-09 ENCOUNTER — Other Ambulatory Visit: Admitting: Urology

## 2024-11-06 ENCOUNTER — Other Ambulatory Visit: Admitting: Urology

## 2024-12-18 ENCOUNTER — Ambulatory Visit: Admit: 2024-12-18 | Admitting: Internal Medicine
